# Patient Record
Sex: Male | Born: 1935
Health system: Southern US, Community
[De-identification: ages and names within clinical notes are randomized; demographics above are authoritative.]

## PROBLEM LIST (undated history)

## (undated) DIAGNOSIS — F03B11 Unspecified dementia, moderate, with agitation: Secondary | ICD-10-CM

## (undated) DIAGNOSIS — I2510 Atherosclerotic heart disease of native coronary artery without angina pectoris: Secondary | ICD-10-CM

## (undated) DIAGNOSIS — I10 Essential (primary) hypertension: Secondary | ICD-10-CM

## (undated) DIAGNOSIS — Z789 Other specified health status: Secondary | ICD-10-CM

## (undated) DIAGNOSIS — I4891 Unspecified atrial fibrillation: Secondary | ICD-10-CM

## (undated) DIAGNOSIS — E785 Hyperlipidemia, unspecified: Secondary | ICD-10-CM

## (undated) HISTORY — DX: Other specified health status: Z78.9

## (undated) HISTORY — DX: Unspecified atrial fibrillation: I48.91

## (undated) HISTORY — DX: Atherosclerotic heart disease of native coronary artery without angina pectoris: I25.10

## (undated) HISTORY — PX: ANKLE FRACTURE SURGERY: SHX122

## (undated) HISTORY — DX: Hyperlipidemia, unspecified: E78.5

## (undated) HISTORY — PX: CORONARY ANGIOPLASTY WITH STENT PLACEMENT: SHX49

## (undated) HISTORY — PX: CHOLECYSTECTOMY: SHX55

## (undated) HISTORY — PX: INGUINAL HERNIA REPAIR: SUR1180

---

## 1997-08-06 HISTORY — PX: OTHER SURGICAL HISTORY: SHX169

## 1998-03-30 ENCOUNTER — Encounter: Payer: Self-pay | Admitting: Emergency Medicine

## 1998-03-30 ENCOUNTER — Emergency Department (HOSPITAL_COMMUNITY): Admission: EM | Admit: 1998-03-30 | Discharge: 1998-03-30 | Payer: Self-pay | Admitting: Emergency Medicine

## 1998-03-31 ENCOUNTER — Ambulatory Visit: Admission: RE | Admit: 1998-03-31 | Discharge: 1998-03-31 | Payer: Self-pay | Admitting: Cardiology

## 2001-08-09 HISTORY — PX: CORONARY ARTERY BYPASS GRAFT: SHX141

## 2002-04-05 ENCOUNTER — Inpatient Hospital Stay (HOSPITAL_COMMUNITY): Admission: AD | Admit: 2002-04-05 | Discharge: 2002-04-10 | Payer: Self-pay | Admitting: Cardiology

## 2002-04-05 ENCOUNTER — Encounter: Payer: Self-pay | Admitting: Cardiology

## 2002-04-06 ENCOUNTER — Encounter: Payer: Self-pay | Admitting: Thoracic Surgery (Cardiothoracic Vascular Surgery)

## 2002-04-07 ENCOUNTER — Encounter: Payer: Self-pay | Admitting: Thoracic Surgery (Cardiothoracic Vascular Surgery)

## 2002-04-08 ENCOUNTER — Encounter: Payer: Self-pay | Admitting: Cardiothoracic Surgery

## 2002-04-09 ENCOUNTER — Encounter: Payer: Self-pay | Admitting: Cardiothoracic Surgery

## 2002-08-09 HISTORY — PX: LAPAROSCOPIC INCISIONAL / UMBILICAL / VENTRAL HERNIA REPAIR: SUR789

## 2002-08-13 ENCOUNTER — Encounter
Admission: RE | Admit: 2002-08-13 | Discharge: 2002-08-13 | Payer: Self-pay | Admitting: Thoracic Surgery (Cardiothoracic Vascular Surgery)

## 2002-08-13 ENCOUNTER — Encounter: Payer: Self-pay | Admitting: Thoracic Surgery (Cardiothoracic Vascular Surgery)

## 2002-11-23 ENCOUNTER — Encounter: Payer: Self-pay | Admitting: Thoracic Surgery (Cardiothoracic Vascular Surgery)

## 2002-11-29 ENCOUNTER — Inpatient Hospital Stay (HOSPITAL_COMMUNITY)
Admission: RE | Admit: 2002-11-29 | Discharge: 2002-12-01 | Payer: Self-pay | Admitting: Thoracic Surgery (Cardiothoracic Vascular Surgery)

## 2004-07-28 ENCOUNTER — Ambulatory Visit: Payer: Self-pay | Admitting: Cardiology

## 2005-01-09 ENCOUNTER — Ambulatory Visit: Payer: Self-pay | Admitting: Cardiology

## 2005-01-09 ENCOUNTER — Inpatient Hospital Stay (HOSPITAL_COMMUNITY): Admission: EM | Admit: 2005-01-09 | Discharge: 2005-01-15 | Payer: Self-pay | Admitting: Emergency Medicine

## 2005-01-11 ENCOUNTER — Ambulatory Visit: Payer: Self-pay | Admitting: Internal Medicine

## 2005-01-14 ENCOUNTER — Encounter (INDEPENDENT_AMBULATORY_CARE_PROVIDER_SITE_OTHER): Payer: Self-pay | Admitting: *Deleted

## 2005-05-26 ENCOUNTER — Encounter: Admission: RE | Admit: 2005-05-26 | Discharge: 2005-05-26 | Payer: Self-pay | Admitting: Orthopedic Surgery

## 2005-05-27 ENCOUNTER — Ambulatory Visit: Payer: Self-pay | Admitting: Cardiology

## 2005-06-03 ENCOUNTER — Ambulatory Visit: Payer: Self-pay | Admitting: Cardiology

## 2005-07-06 ENCOUNTER — Ambulatory Visit (HOSPITAL_COMMUNITY): Admission: RE | Admit: 2005-07-06 | Discharge: 2005-07-06 | Payer: Self-pay | Admitting: Orthopedic Surgery

## 2005-07-06 ENCOUNTER — Ambulatory Visit (HOSPITAL_BASED_OUTPATIENT_CLINIC_OR_DEPARTMENT_OTHER): Admission: RE | Admit: 2005-07-06 | Discharge: 2005-07-07 | Payer: Self-pay | Admitting: Orthopedic Surgery

## 2006-05-10 ENCOUNTER — Ambulatory Visit: Payer: Self-pay | Admitting: Cardiology

## 2006-05-17 ENCOUNTER — Ambulatory Visit: Payer: Self-pay | Admitting: Cardiology

## 2006-10-04 ENCOUNTER — Inpatient Hospital Stay (HOSPITAL_COMMUNITY): Admission: EM | Admit: 2006-10-04 | Discharge: 2006-10-06 | Payer: Self-pay | Admitting: Emergency Medicine

## 2006-10-04 ENCOUNTER — Ambulatory Visit: Payer: Self-pay | Admitting: Cardiology

## 2006-10-05 HISTORY — PX: CORONARY ANGIOPLASTY WITH STENT PLACEMENT: SHX49

## 2006-10-13 ENCOUNTER — Ambulatory Visit: Payer: Self-pay | Admitting: Cardiology

## 2006-11-21 ENCOUNTER — Ambulatory Visit: Payer: Self-pay | Admitting: Cardiology

## 2007-02-23 ENCOUNTER — Ambulatory Visit: Payer: Self-pay | Admitting: Cardiology

## 2007-08-11 ENCOUNTER — Ambulatory Visit: Payer: Self-pay | Admitting: Cardiology

## 2007-08-23 ENCOUNTER — Ambulatory Visit: Payer: Self-pay

## 2007-09-01 ENCOUNTER — Ambulatory Visit: Payer: Self-pay | Admitting: Cardiology

## 2007-10-18 ENCOUNTER — Ambulatory Visit: Payer: Self-pay | Admitting: Cardiology

## 2008-01-25 ENCOUNTER — Ambulatory Visit: Payer: Self-pay | Admitting: Cardiology

## 2008-07-25 ENCOUNTER — Ambulatory Visit: Payer: Self-pay | Admitting: Cardiology

## 2008-09-13 ENCOUNTER — Ambulatory Visit: Payer: Self-pay | Admitting: Diagnostic Radiology

## 2008-09-13 ENCOUNTER — Emergency Department (HOSPITAL_BASED_OUTPATIENT_CLINIC_OR_DEPARTMENT_OTHER): Admission: EM | Admit: 2008-09-13 | Discharge: 2008-09-13 | Payer: Self-pay | Admitting: Emergency Medicine

## 2008-09-16 ENCOUNTER — Ambulatory Visit: Payer: Self-pay | Admitting: Cardiology

## 2008-09-16 LAB — CONVERTED CEMR LAB
Basophils Absolute: 0 10*3/uL (ref 0.0–0.1)
Basophils Relative: 0.3 % (ref 0.0–3.0)
Bilirubin, Direct: 0.1 mg/dL (ref 0.0–0.3)
Chloride: 107 meq/L (ref 96–112)
Eosinophils Absolute: 0.5 10*3/uL (ref 0.0–0.7)
Eosinophils Relative: 7.6 % — ABNORMAL HIGH (ref 0.0–5.0)
GFR calc non Af Amer: 43 mL/min
HCT: 40.3 % (ref 39.0–52.0)
Lymphocytes Relative: 22.2 % (ref 12.0–46.0)
MCHC: 34.3 g/dL (ref 30.0–36.0)
MCV: 96.8 fL (ref 78.0–100.0)
Monocytes Absolute: 0.6 10*3/uL (ref 0.1–1.0)
Monocytes Relative: 9.6 % (ref 3.0–12.0)
Neutro Abs: 3.6 10*3/uL (ref 1.4–7.7)
Neutrophils Relative %: 60.3 % (ref 43.0–77.0)
RBC: 4.16 M/uL — ABNORMAL LOW (ref 4.22–5.81)
RDW: 12.9 % (ref 11.5–14.6)
Total Bilirubin: 1.3 mg/dL — ABNORMAL HIGH (ref 0.3–1.2)
Total CHOL/HDL Ratio: 5.5
Triglycerides: 127 mg/dL (ref 0–149)
VLDL: 25 mg/dL (ref 0–40)
WBC: 6 10*3/uL (ref 4.5–10.5)

## 2008-10-03 ENCOUNTER — Ambulatory Visit: Payer: Self-pay | Admitting: Cardiology

## 2008-10-03 LAB — CONVERTED CEMR LAB
CO2: 25 meq/L (ref 19–32)
Creatinine, Ser: 1.4 mg/dL (ref 0.4–1.5)
GFR calc non Af Amer: 53 mL/min
Glucose, Bld: 83 mg/dL (ref 70–99)

## 2008-10-10 ENCOUNTER — Ambulatory Visit: Payer: Self-pay | Admitting: Cardiology

## 2008-12-12 ENCOUNTER — Telehealth: Payer: Self-pay | Admitting: Cardiology

## 2009-03-07 ENCOUNTER — Ambulatory Visit: Payer: Self-pay | Admitting: Cardiology

## 2009-03-07 DIAGNOSIS — I25810 Atherosclerosis of coronary artery bypass graft(s) without angina pectoris: Secondary | ICD-10-CM | POA: Insufficient documentation

## 2009-03-07 DIAGNOSIS — E7800 Pure hypercholesterolemia, unspecified: Secondary | ICD-10-CM

## 2009-04-15 ENCOUNTER — Encounter: Payer: Self-pay | Admitting: Cardiology

## 2009-04-18 ENCOUNTER — Telehealth (INDEPENDENT_AMBULATORY_CARE_PROVIDER_SITE_OTHER): Payer: Self-pay | Admitting: Physician Assistant

## 2009-04-18 ENCOUNTER — Encounter: Payer: Self-pay | Admitting: Cardiology

## 2009-04-18 ENCOUNTER — Telehealth: Payer: Self-pay | Admitting: Cardiology

## 2009-04-18 ENCOUNTER — Emergency Department (HOSPITAL_COMMUNITY): Admission: EM | Admit: 2009-04-18 | Discharge: 2009-04-18 | Payer: Self-pay | Admitting: Emergency Medicine

## 2009-04-21 ENCOUNTER — Encounter: Payer: Self-pay | Admitting: Cardiology

## 2009-05-27 ENCOUNTER — Encounter: Payer: Self-pay | Admitting: Cardiology

## 2009-11-25 ENCOUNTER — Encounter: Payer: Self-pay | Admitting: Cardiology

## 2010-01-22 ENCOUNTER — Telehealth (INDEPENDENT_AMBULATORY_CARE_PROVIDER_SITE_OTHER): Payer: Self-pay | Admitting: *Deleted

## 2010-04-02 ENCOUNTER — Ambulatory Visit: Payer: Self-pay | Admitting: Cardiology

## 2010-08-06 ENCOUNTER — Inpatient Hospital Stay (HOSPITAL_COMMUNITY)
Admission: EM | Admit: 2010-08-06 | Discharge: 2010-08-08 | Payer: Self-pay | Source: Home / Self Care | Attending: Internal Medicine | Admitting: Internal Medicine

## 2010-08-06 ENCOUNTER — Telehealth: Payer: Self-pay | Admitting: Cardiology

## 2010-08-26 ENCOUNTER — Encounter: Payer: Self-pay | Admitting: Internal Medicine

## 2010-08-26 ENCOUNTER — Other Ambulatory Visit: Payer: Self-pay | Admitting: Internal Medicine

## 2010-08-26 ENCOUNTER — Ambulatory Visit
Admission: RE | Admit: 2010-08-26 | Discharge: 2010-08-26 | Payer: Self-pay | Source: Home / Self Care | Attending: Physician Assistant | Admitting: Physician Assistant

## 2010-08-26 DIAGNOSIS — I10 Essential (primary) hypertension: Secondary | ICD-10-CM | POA: Insufficient documentation

## 2010-08-26 DIAGNOSIS — E872 Acidosis: Secondary | ICD-10-CM | POA: Insufficient documentation

## 2010-08-26 LAB — BASIC METABOLIC PANEL
BUN: 25 mg/dL — ABNORMAL HIGH (ref 6–23)
CO2: 26 mEq/L (ref 19–32)
Calcium: 9.8 mg/dL (ref 8.4–10.5)
Chloride: 109 mEq/L (ref 96–112)
Creatinine, Ser: 1.5 mg/dL (ref 0.4–1.5)
GFR: 48.4 mL/min — ABNORMAL LOW (ref >60.00)
Glucose, Bld: 95 mg/dL (ref 70–99)
Potassium: 4.9 mEq/L (ref 3.5–5.1)
Sodium: 142 mEq/L (ref 135–145)

## 2010-08-31 NOTE — Discharge Summary (Addendum)
NAME:  SHAHMEER, BUNN NO.:  [id number]  MEDICAL RECORD NO.:  [id number]           PATIENT TYPE:  LOCATION:                                 FACILITY:  PHYSICIAN:  Peter C. Eden Emms, MD, FACCDATE OF BIRTH:  05/04/1936  DATE OF ADMISSION:  08/06/2010 DATE OF DISCHARGE:                              DISCHARGE SUMMARY   DATE OF DISCHARGE:  August 08, 2010  PRIMARY CARDIOLOGIST:  Arturo Morton. Riley Kill, MD, [facility]  DISCHARGE DIAGNOSIS:  Unstable angina.  SECONDARY DIAGNOSES: 1. Coronary artery disease status post prior coronary artery bypass     grafting. 2. Hypertension. 3. Hyperlipidemia. 4. Stage III chronic kidney disease. 5. History of bronchogenic cyst. 6. Bronchogenic Cyst status post resection. 7. History of ventral incisional hernia repair in 2004. 8. History of left ankle surgery.  ALLERGIES:  The patient is intolerant to STATINS.  PROCEDURES:  Left heart cardiac catheterization revealing mild-to- moderate in-stent restenosis within the previously placed LAD stent. The OM1 had an 80% to 90% stenosis proximally.  The RCA had minor irregularities throughout.  The vein graft to the diagonal was patent. The vein graft to the OM1 was occluded.  The vein graft to the PDA was not checked as this was known to be occluded.  The LIMA to the LAD was atretic.  PROCEDURE:  Successful PCI and stenting of the first obtuse marginal with placement of a 3.5 x 20 mm Promus Element Plus drug-eluting stent.  HISTORY OF PRESENT ILLNESS:  This is a 75 year old Caucasian male with prior history of CAD status post coronary bypass grafting in 2003.  The patient was in his usual state of health approximately a week prior to admission when he began to experience intermittent left-sided chest discomfort similar to previous anginal equivalent.  Because of progression of symptoms, patient presented to the [facility] ED on December 29, where ECG showed no acute changes.  The  point-of-care cardiac markers, however, were slightly elevated with troponin 0.17.  The patient was admitted for further evaluation.  HOSPITAL COURSE:  The patient's subsequent cardiac markers showed normal troponins as well as normal CKs and MBs.  Regardless, given his progression of symptoms and similarity to previous angina, decision was made to proceed with cardiac catheterization.  The patient underwent diagnostic catheterization on December 30th with the results as outlined above.  He had 80% to 90% stenosis within the first obtuse marginal and occlusion of the vein graft to that vessel.  Previously placed stents in the LAD and right coronary artery were patent.  Cath films were reviewed.  Attention was turned to the native OM1 which was successfully stented with a 3.5 x 20-mm Promus Element Plus drug-eluting stent.  The patient tolerated this procedure well and post procedure, had been ambulating without recurrent symptoms or limitations.  He will be discharged home today in good condition.  DISCHARGE LABORATORY DATA:  Hemoglobin 13.4, hematocrit 39.5, WBCs 6.9, and platelets 160.  Sodium 136, potassium 4.2, chloride 109, CO2 of 19, BUN 15, creatinine 1.29, glucose 100.  Total bilirubin 1.1, alkaline phosphatase 69, AST 23, ALT 17, total protein 6.7, albumin 4.0,  calcium 8.4, CK 62, MB 3.1, troponin I of 0.06.  BNP was 187 on admission.  TSH 0.652.  DISPOSITION:  The patient will be discharged home today in good condition.  FOLLOWUP APPOINTMENTS:  We will arrange for followup with Tereso Newcomer, PA, in approximately 2 weeks.  DISCHARGE MEDICATIONS: 1. Nitroglycerin 0.4 mg sublingual p.r.n. chest pain. 2. Aspirin 325 mg daily. 3. Amlodipine 10 mg daily. 4. Atenolol 50 mg daily. 5. Plavix 75 mg daily. 6. The patient is not on a statin secondary to previous intolerance.  STUDIES:  None.  Duration of discharge encounter, 45 minutes including physician  time.      Nicolasa Ducking, ANP   ______________________________ Noralyn Pick. Eden Emms, MD, [facility]    CB/MEDQ  D:  08/08/2010  T:  08/08/2010  Job:  [id number]  Electronically Signed by Nicolasa Ducking ANP on 08/31/2010 03:39:35 PM Electronically Signed by Charlton Haws MD FACC on 08/31/2010 05:26:08 PM

## 2010-09-04 NOTE — H&P (Signed)
NAME:  Steve Gordon, Steve Gordon NO.:  [id number]  MEDICAL RECORD NO.:  [id number]          PATIENT TYPE:  INP  LOCATION:  2807                         FACILITY:  [facility]  PHYSICIAN:  Pricilla Riffle, MD, FACCDATE OF BIRTH:  05-28-1936  DATE OF ADMISSION:  08/06/2010 DATE OF DISCHARGE:                             HISTORY & PHYSICAL   PRIMARY CARDIOLOGIST:  Arturo Morton. Riley Kill, MD, [facility]  CHIEF COMPLAINT:  Chest discomfort.  HISTORY OF PRESENT ILLNESS:  Steve Gordon is a 75 year old Caucasian gentleman with a known history of coronary artery disease having had an anterior wall MI in 1998, of CABG x4 2003, most recently PCI of the native right coronary artery with non-DES February 2008 as well as history significant for hypertension, dyslipidemia, and CKD stage III now presents for further evaluation of symptoms similar to those he was having in 2008.  The patient was in his usual state of health until little over a week ago when he began to notice brief (30 seconds to a minute) left-sided chest discomfort that would slowly dissipate after initially being noticed.  He has no radiation nor any associated symptoms.  The patient says that this is how his symptoms started in 2008 prior to his catheterization and eventual PCI of the right coronary artery.  Of note, the patient also states that these symptoms completely resolved after his PCI.  He decided to come in sooner than his last time as his symptoms progressed and became pretty severe prior to his catheterization in 2008.  Upon arrival, EKG shows sinus bradycardia without concerning ST changes but point-of-care markers reveal a troponin I elevated to 0.17.  Labs only remarkable for creatinine of 1.38, BUN 19, BNP 187.  Vital signs significant for mildly hypertensive with peak systolic of 159 and bradycardic in the 50s.  Otherwise vital signs within normal limits and stable.  Chest x-ray unremarkable. Currently the patient is  asymptomatic except for a headache that started shortly after receiving nitro paste.  Note the patient's symptoms of chest discomfort have been increasing in frequency now happening sometimes multiple times throughout the day.  He denies any clear aggravating or alleviating factors.  No associated symptoms like shortness of breath, PND, nausea, vomiting, palpitations, pre/syncope.  PAST MEDICAL HISTORY: 1. CAD.     a.     Anterior wall MI (PCI of LAD and diagonal), 1998.     b.     CABG x4 (LIMA to LAD, SVG - diagonal, SVG to OM, SVG to      PDA), 2003.     c.     PCI of native RCA with 4.0 x 16-mm Libert? non-DES, October 05, 2006 (OM mildly calcified without significant disease, LAD      less than 50% in-stent restenosis, D. 2. Diffusely diseased, rest of all LAD with mild nonobstructive     disease.  Left circumflex has large OM. 3. With 75-80% stenosis, left circumflex with mild disease otherwise.     RCA with 99% stenosis, S/P BMS.  PDA 30-50% lesions throughout.     LIMA to LAD atretic.  Patent SVG to diagonal.  SVG to OM and right     PDA occluded at aortic anastomosis.  Normal LVEF). 4. Dyslipidemia. 5. Hypertension. 6. CKD, stage III. 7. Resection of bronchogenic cyst, 1998. 8. Ventral incisional hernia repair, 2004. 9. Left ankle surgery.  SOCIAL HISTORY:  The patient lives in [place] with his wife.  He has 2 daughters and a stepson, all grown.  He is retired from [employer] as a Naval architect since 1610.  Remote minimal tobacco abuse 37 years ago. EtOH rarely without binge drinking.  No illicit drugs.  No herbal meds. He generally follows heart healthy diet and does no regular exercise but is active, working on cars every single day.  FAMILY HISTORY:  Negative for premature diagnosis of coronary artery disease.  REVIEW OF SYSTEMS:  Please see HPI.  All other systems reviewed and were negative.  CODE STATUS:  Full.  ALLERGIES:  NKDA.  Intolerant to  STATINS.  MEDICATIONS: 1. Norvasc 10 mg p.o. at bedtime. 2. Atenolol 50 mg p.o. at bedtime. 3. Sublingual nitroglycerin 0.4 mg p.r.n. for chest discomfort. 4. Plavix 75 mg p.o. at bedtime. 5. Enteric-coated aspirin 81 mg p.o. at bedtime.  PHYSICAL EXAMINATION:  VITAL SIGNS:  Temperature 98.2 degrees Fahrenheit, BP ranging from 118-159/77-81, pulse in the 50s, respiration 18, O2 saturation 99% on room air. GENERAL:  The patient is alert and oriented x3 in no apparent distress, able to speak easily without respiratory distress. HEAD:  Normocephalic, atraumatic.  Pupils are equal, round, and reactive to light.  Extraocular muscles are intact.  Nares are patent without discharge.  Oropharynx without erythema or exudates. NECK:  Supple without lymphadenopathy.  No thyromegaly.  No JVD. HEART:  Rate is regular with audible S1, S2.  No clicks, rubs, murmurs, or gallops.  Pulses are 2+ and equal in both upper and lower extremities bilaterally. LUNGS:  Clear to auscultation bilaterally. SKIN:  No rashes, lesions, or petechiae. ABDOMEN:  Soft, nontender, nondistended.  Normal abdominal bowel sounds. No rebound or guarding, no hepatosplenomegaly.  EXTREMITIES:  Reveal no clubbing, cyanosis nor any edema. MUSCULOSKELETAL:  Without joint deformity or effusions.  No spinal or CVA tenderness. NEUROLOGIC:  Cranial Nerves II through XII grossly intact.  Strength 5/5 in all extremities and axial groups.  Normal sensation throughout and normal cerebellar function.  RADIOLOGY:  Chest x-ray showed possible left upper lobe nodule (follow up with nonemergent outpatient CT without contrast).  EKG, sinus bradycardia, 52 bpm, no significant ST-T wave changes.  No significant Q-waves, normal axis, no evidence of hypertrophy, PR 160, QRS 94, QTC 399.  No changes from last tracing completed February 2010 except for rate has decreased.  LABORATORY DATA:  WBC 7.7, HGB 14.7, HCT 42.8, PLT count was  169,000. Sodium 139, potassium 4.7, chloride 110, bicarb 22, BUN 19, creatinine 1.3, glucose 97.  Liver function tests within normal limits.  BNP 187, troponin 0.17.  Protime 12.9, INR 0.95.  ASSESSMENT/PLAN:  The patient initially seen by Lenard Galloway, [facility] and currently being seen and thoroughly assessed by attending cardiologist, Dr. Dietrich Pates.  Please see her addendum for any significant changes to this assessment and plan.  Mr. Slaymaker is a 75 year old Caucasian gentleman with the above-noted complex history of coronary artery disease including anterior wall MI with PCI of LAD and diagonal in 1998, CABG x4 in 2003, and last cath resulting in PCI with BMS to right coronary artery with 1/4 patent/atretic grafts as well as history significant for dyslipidemia, hypertension,  and CKD stage III who now presents with atypical symptoms that are unfortunately, however, identical to his angina he recalls prior to PCI in 2008 with minimal troponin-I elevation on point-of-care markers.  Non-ST elevation myocardial infarction - minimal troponin elevation and atypical symptoms but as they are identical to symptoms prior to PCI in 2008, would follow enzymes and favor cardiac catheterization.  We will continue home medications, cycle enzymes and likely complete cardiac catheterization tomorrow.  Risks and benefits reviewed with the patient and he is willing to proceed if the final recommendation is for cath.     Jarrett Ables, PAC   ______________________________ Pricilla Riffle, MD, [facility]    MS/MEDQ  D:  08/06/2010  T:  08/07/2010  Job:  [id number]  Electronically Signed by Jarrett Ables PAC on 08/19/2010 02:56:25 PM Electronically Signed by Dietrich Pates MD FACC on 09/04/2010 07:55:49 PM

## 2010-09-08 NOTE — Progress Notes (Signed)
Summary: QUESTION ABOUT MEDS/UPCOMING PROCEDURE  Medications Added AMLODIPINE BESYLATE 10 MG TABS (AMLODIPINE BESYLATE) Take 1 tablet by mouth once a day ATENOLOL 50 MG TABS (ATENOLOL) Take 1 tablet by mouth once a day NITROSTAT 0.4 MG SUBL (NITROGLYCERIN) Place 1 tablet under tongue as directed PLAVIX 75 MG TABS (CLOPIDOGREL BISULFATE) Take 1 tablet by mouth once a day ZETIA 10 MG TABS (EZETIMIBE) Take 1 tablet by mouth once a day       Phone Note Call from Patient Call back at [phone]   Caller: Spouse [name] Summary of Call: PATIENT IS SCHEDULE TO HAVE A MOLE REMOVED.  HOW LONG SHOULD BE BE OFF OF PLAVIX (BEFORE AND AFTER)? Initial call taken by: Burnard Leigh,  Dec 12, 2008 4:03 PM  Follow-up for Phone Call        [facility] to call back. Julieta Gutting, RN, BSN  Dec 12, 2008 4:05 PM   CALLING BACK TO SPEAK WITH [place], [place] [name] . GESILA DAVIS   Additional Follow-up for Phone Call Additional follow up Details #1::        Spoke with pt's wife and pt has not seen the doctor yet about having mole removed.  I told her he needed to be seen and that the pt probably will not need to stop Plavix.  When the pt is evaluated the physician should call our office if plavix needs to be stopped. Pt's wife agrees with plan. Additional Follow-up by: Julieta Gutting, RN, BSN,  Dec 13, 2008 1:17 PM    New/Updated Medications: AMLODIPINE BESYLATE 10 MG TABS (AMLODIPINE BESYLATE) Take 1 tablet by mouth once a day ATENOLOL 50 MG TABS (ATENOLOL) Take 1 tablet by mouth once a day NITROSTAT 0.4 MG SUBL (NITROGLYCERIN) Place 1 tablet under tongue as directed PLAVIX 75 MG TABS (CLOPIDOGREL BISULFATE) Take 1 tablet by mouth once a day ZETIA 10 MG TABS (EZETIMIBE) Take 1 tablet by mouth once a day

## 2010-09-08 NOTE — Assessment & Plan Note (Signed)
Summary: Steve Gordon   Visit Type:  1 year follow up  CC:  No cardiac complains.  History of Present Illness: Patient is doing well.  Has ot gained weight.  Still restoring cars.  Most recently working on a [employer] T.  No chest pain.    Current Medications (verified): 1)  Amlodipine Besylate 10 Mg Tabs (Amlodipine Besylate) .... Take 1 Tablet By Mouth Once A Day 2)  Atenolol 50 Mg Tabs (Atenolol) .... Take 1 Tablet By Mouth Once A Day 3)  Nitrostat 0.4 Mg Subl (Nitroglycerin) .... Place 1 Tablet Under Tongue As Directed 4)  Plavix 75 Mg Tabs (Clopidogrel Bisulfate) .... Take 1 Tablet By Mouth Once A Day 5)  Aspirin 81 Mg Tbec (Aspirin) .... 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 Head:  normocephalic and atraumatic Eyes:  PERRLA/EOM intact; conjunctiva and lids normal. Lungs:  Clear bilaterally to auscultation and percussion. Heart:  PMI non displaced. Normal S1 and S2.  NO murmur.    Extremities:  No clubbing or cyanosis. Neurologic:  Alert and oriented x 3.   EKG  Procedure date:  04/02/2010  Findings:      marked sinus bradycardia, otherwise normal .  Impression & Recommendations:  Problem # 1:  CAD, ARTERY BYPASS GRAFT (ICD-414.04)  entirely stable.  No chest pain.  His updated medication list for this problem includes:    Amlodipine Besylate 10 Mg Tabs (Amlodipine besylate) .Marland Kitchen... Take 1 tablet by mouth once a day    Atenolol 50 Mg Tabs (Atenolol) .Marland Kitchen... Take 1 tablet by mouth once a day    Nitrostat 0.4 Mg Subl (Nitroglycerin) .Marland Kitchen... Place 1 tablet under tongue as directed    Plavix 75 Mg Tabs (Clopidogrel bisulfate) .Marland Kitchen... Take 1 tablet by mouth once a day    Aspirin 81 Mg Tbec (Aspirin) .Marland Kitchen... Take one tablet by mouth daily  His updated medication list for this problem includes:    Amlodipine Besylate 10 Mg Tabs (Amlodipine besylate) .Marland Kitchen... Take 1 tablet by mouth once a day    Atenolol 50 Mg Tabs (Atenolol) .Marland Kitchen... Take 1 tablet by mouth once a day    Nitrostat 0.4 Mg Subl (Nitroglycerin) .Marland Kitchen... Place 1 tablet under tongue as directed    Plavix 75 Mg Tabs (Clopidogrel bisulfate) .Marland Kitchen... Take 1 tablet by mouth once a day    Aspirin 81 Mg Tbec (Aspirin) .Marland KitchenMarland KitchenMarland KitchenMarland Kitchen  Take one tablet by mouth daily  Problem # 2:  HYPERCHOLESTEROLEMIA  IIA (ICD-272.0) not a statin candidate.    Other Orders: EKG w/ Interpretation (93000)  Patient Instructions: 1)  Your physician recommends that you return for a FASTING lipid and liver profile: after losing five pounds. Please call when ready to schedule appointment. 2)  Your physician recommends that you continue on your current medications as directed. Please refer to the Current Medication list given to you today. 3)  Your physician wants you to follow-up in: 1 year   You will receive a reminder letter in the mail two months in advance. If you don't receive a letter, please call our office to schedule the follow-up  appointment. Prescriptions: NITROSTAT 0.4 MG SUBL (NITROGLYCERIN) Place 1 tablet under tongue as directed  #25 x 2   Entered by:   Vikki Ports   Authorized by:   Ronaldo Miyamoto, MD, [facility]   Signed by:   Vikki Ports on 04/02/2010   Method used:   Printed then faxed to ...       [facility]* (retail)             ,          Ph: [phone]       Fax: [phone]   RxID:   [id number]

## 2010-09-08 NOTE — Progress Notes (Signed)
Summary: refill  Phone Note Refill Request Call back at [phone] Message from:  Patient on January 22, 2010 2:10 PM  Refills Requested: Medication #1:  ATENOLOL 50 MG TABS Take 1 tablet by mouth once a day  Medication #2:  AMLODIPINE BESYLATE 10 MG TABS Take 1 tablet by mouth once a day  Medication #3:  PLAVIX 75 MG TABS Take 1 tablet by mouth once a day  Medication #4:  NITROSTAT 0.4 MG SUBL Place 1 tablet under tongue as directed [facility] send  Nitrostst to [facility] [phone]  Initial call taken by: Judie Grieve,  January 22, 2010 2:12 PM  Follow-up for Phone Call        Rx faxed to pharmacy Nitrog. called to pharmacy Follow-up by: Vikki Ports,  January 22, 2010 4:05 PM    Prescriptions: PLAVIX 75 MG TABS (CLOPIDOGREL BISULFATE) Take 1 tablet by mouth once a day  #90 x 3   Entered by:   Vikki Ports   Authorized by:   Ronaldo Miyamoto, MD, [facility]   Signed by:   Vikki Ports on 01/22/2010   Method used:   Faxed to ...       [facility]* (retail)             ,          Ph: [phone]       Fax: [phone]   RxID:   [id number] ATENOLOL 50 MG TABS (ATENOLOL) Take 1 tablet by mouth once a day  #90 x 3   Entered by:   Vikki Ports   Authorized by:   Ronaldo Miyamoto, MD, [facility]   Signed by:   Vikki Ports on 01/22/2010   Method used:   Faxed to ...       [facility]* (retail)             ,          Ph: [phone]       Fax: [phone]   RxID:   [id number] AMLODIPINE BESYLATE 10 MG TABS (AMLODIPINE BESYLATE) Take 1 tablet by mouth once a day  #90 x 3   Entered by:   Vikki Ports   Authorized by:   Ronaldo Miyamoto, MD, [facility]   Signed by:   Vikki Ports on 01/22/2010   Method used:   Faxed to ...       [facility]* (retail)             ,          Ph: [phone]       Fax: [phone]   RxID:   [id number]

## 2010-09-08 NOTE — Letter (Signed)
Summary: [facility] Office Note  [facility] Office Note   Imported By: Roderic Ovens 01/28/2010 15:12:47  _____________________________________________________________________  External Attachment:    Type:   Image     Comment:   External Document

## 2010-09-08 NOTE — Letter (Signed)
Summary: [facility]   [facility]   Imported By: Roderic Ovens 10/15/2009 14:01:20  _____________________________________________________________________  External Attachment:    Type:   Image     Comment:   External Document

## 2010-09-10 NOTE — Progress Notes (Addendum)
Summary: chest pains  Phone Note Call from Patient Call back at Home Phone [phone]   Caller: Patient Summary of Call: Pt having chest pains Initial call taken by: Judie Grieve,  August 06, 2010 12:27 PM  Follow-up for Phone Call        SPOKE WITH PT AND PT'S WIFE  OVER THE LAST COUPLE OF WEEKS HAS NOTED CHEST PAIN NO C/O SOB  NAUSEA OR JAW PAIN NO NITRO TAKEN PER PT NOT THAT SEVERE PAIN HAS COME ON AT REST WILL DISCUSS WITH DOD AND CALL PT BACK WITH DIRECTIONS. Follow-up by: Scherrie Bateman, LPN,  August 06, 2010 12:37 PM  Additional Follow-up for Phone Call Additional follow up Details #1::        DISCUSSED WITH DR CRENSHAW PER DR CRENSHAW PT NEEDS TO GO ER FOR EVALUATION AND TREATMENT.PT AWARE. Additional Follow-up by: Scherrie Bateman, LPN,  August 06, 2010 1:14 PM     Appended Document: chest pains Reviewed. TS

## 2010-09-10 NOTE — Assessment & Plan Note (Signed)
Summary: eph/status post PCI per night message   Visit Type:  eph  CC:  no complaints.  Preventive Screening-Counseling & Management  Alcohol-Tobacco     Smoking Status: quit  Problems Prior to Update: 1)  Hypertension, Benign  (ICD-401.1) 2)  Metabolic Acidosis  (ICD-276.2) 3)  Sinus Bradycardia  (ICD-427.81) 4)  Nonstemi  (ICD-410.90) 5)  Hypercholesterolemia Iia  (ICD-272.0) 6)  Cad, Artery Bypass Graft  (ICD-414.04)  Current Medications (verified): 1)  Amlodipine Besylate 10 Mg Tabs (Amlodipine Besylate) .... Take 1 Tablet By Mouth Once A Day 2)  Atenolol 50 Mg Tabs (Atenolol) .... Take 1 Tablet By Mouth Once A Day 3)  Nitrostat 0.4 Mg Subl (Nitroglycerin) .... Place 1 Tablet Under Tongue As Directed 4)  Plavix 75 Mg Tabs (Clopidogrel Bisulfate) .... Take 1 Tablet By Mouth Once A Day 5)  Aspirin 81 Mg Tbec (Aspirin) .... Take One Tablet By Mouth Daily  Allergies (verified): No Known Drug Allergies  Past History:  Past Medical History: Last updated: 10/06/2008 Anterior wall MI 1998 with stenting LAD and diagonal CAD sp CABG S/P PCI  Dyslipidemia Statin Intolerance  Past Surgical History: Last updated: 10/06/2008 CABG x 4 2003 LIMA to LAD SVG diagonal, OM, PDA Stent Native RCA 10/05/2006 4.79mm x 16mm Liberte Non DES Ventral incisional hernia repair 2004 Resection of bronchogenic cyst 08-06-1997  Family History: Last updated: 08/26/2010 Hypertension, heart disease, and kidney cancer  Social History: Last updated: 08/26/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  quite 40+ years Smoking Status:  quit  Vital Signs:  Patient profile:   75 year old male Height:      69 inches Weight:      178.50  pounds BMI:     26.46 Pulse rate:   50 / minute BP sitting:   150 / 73  (left arm) Cuff size:   regular  Vitals Entered By: Caralee Ates CMA (August 26, 2010 8:56 AM)   Other Orders: TLB-BMP (Basic Metabolic Panel-BMET) (80048-METABOL)  Patient Instructions: 1)  Your physician recommends that you have lab work today: BMET 2)  Your physician has recommended you make the following change in your medication: Decrease Aspirin to 81mg  daily. 3)  Your physician wants you to follow-up in:3 months with Dr. Riley Kill.   You will receive a reminder letter in the mail two months in advance. If you don't receive a letter, please call our office to schedule the follow-up appointment.

## 2010-09-10 NOTE — Assessment & Plan Note (Signed)
Summary: eph/status post PCI per night message   History of Present Illness: mr Lemmons seeing follwing DES to OM;  prior cabg and stenting   no further sighnificant cxhest pain  He hast long-standing complaints of numbness in his left arm.  He has chosen not to pursue primary care physicians because of problems in the past. Her records as his blood pressures at home have been normal  Allergies: No Known Drug Allergies  Physical Exam  General:  The patient was alert and oriented in no acute distress. HEENT Normal.  Neck veins were flat, carotids were brisk.  Lungs were clear.  Heart sounds were regular without murmurs or gallops.  Abdomen was soft with active bowel sounds. There is no clubbing cyanosis or edema. Skin Warm and dry groin is well-healed with a little bit of fullness of the artery no bruit was noted   Impression & Recommendations:  Problem # 1:  NONSTEMI (ICD-410.90) The patient will be maintained on Plavix and aspirin; we will reduce his aspirin from 325-81. He has not been able to tolerate statins in the past His updated medication list for this problem includes:    Amlodipine Besylate 10 Mg Tabs (Amlodipine besylate) .Marland Kitchen... Take 1 tablet by mouth once a day    Atenolol 50 Mg Tabs (Atenolol) .Marland Kitchen... Take 1 tablet by mouth once a day    Nitrostat 0.4 Mg Subl (Nitroglycerin) .Marland Kitchen... Place 1 tablet under tongue as directed    Plavix 75 Mg Tabs (Clopidogrel bisulfate) .Marland Kitchen... Take 1 tablet by mouth once a day    Aspirin 81 Mg Tbec (Aspirin) .Marland Kitchen... Take one tablet by mouth daily  Problem # 2:  SINUS BRADYCARDIA (ICD-427.81) Bradycardia is chronic. We'll use atenolol at its current dose. His updated medication list for this problem includes:    Amlodipine Besylate 10 Mg Tabs (Amlodipine besylate) .Marland Kitchen... Take 1 tablet by mouth once a day    Atenolol 50 Mg Tabs (Atenolol) .Marland Kitchen... Take 1 tablet by mouth once a day    Nitrostat 0.4 Mg Subl (Nitroglycerin) .Marland Kitchen... Place 1 tablet under  tongue as directed    Plavix 75 Mg Tabs (Clopidogrel bisulfate) .Marland Kitchen... Take 1 tablet by mouth once a day    Aspirin 81 Mg Tbec (Aspirin) .Marland Kitchen... Take one tablet by mouth daily  Problem # 3:  METABOLIC ACIDOSIS (ICD-276.2) he had evidence of a metabolic acidosis during his hospital patient. I reviewed records back 10 years. No previous evidence of this was noted. We will recheck his metabolic profile today.  Problem # 5:  HYPERTENSION, BENIGN (ICD-401.1) has hypertension today but as noted in history of present illness, his blood pressures in the 120 range at home we'll continue to follow  His updated medication list for this problem includes:    Amlodipine Besylate 10 Mg Tabs (Amlodipine besylate) .Marland Kitchen... Take 1 tablet by mouth once a day    Atenolol 50 Mg Tabs (Atenolol) .Marland Kitchen... Take 1 tablet by mouth once a day    Aspirin 81 Mg Tbec (Aspirin) .Marland Kitchen... Take one tablet by mouth daily

## 2010-10-19 LAB — BASIC METABOLIC PANEL
BUN: 15 mg/dL (ref 6–23)
CO2: 21 mEq/L (ref 19–32)
Calcium: 8.4 mg/dL (ref 8.4–10.5)
Creatinine, Ser: 1.29 mg/dL (ref 0.4–1.5)
GFR calc Af Amer: >60 mL/min (ref >60)
Glucose, Bld: 94 mg/dL (ref 70–99)

## 2010-10-19 LAB — CBC
HCT: 38.7 % — ABNORMAL LOW (ref 39.0–52.0)
HCT: 41.5 % (ref 39.0–52.0)
HCT: 42.8 % (ref 39.0–52.0)
Hemoglobin: 12.9 g/dL — ABNORMAL LOW (ref 13.0–17.0)
Hemoglobin: 14.3 g/dL (ref 13.0–17.0)
Hemoglobin: 14.7 g/dL (ref 13.0–17.0)
MCH: 32.5 pg (ref 26.0–34.0)
MCH: 32.7 pg (ref 26.0–34.0)
MCV: 95.1 fL (ref 78.0–100.0)
Platelets: 160 10*3/uL (ref 150–400)
Platelets: 169 10*3/uL (ref 150–400)
Platelets: 171 10*3/uL (ref 150–400)
RBC: 4.08 MIL/uL — ABNORMAL LOW (ref 4.22–5.81)
RBC: 4.17 MIL/uL — ABNORMAL LOW (ref 4.22–5.81)
RBC: 4.4 MIL/uL (ref 4.22–5.81)
RBC: 4.5 MIL/uL (ref 4.22–5.81)
RDW: 12.9 % (ref 11.5–15.5)
RDW: 13 % (ref 11.5–15.5)
RDW: 13.1 % (ref 11.5–15.5)
WBC: 5.7 10*3/uL (ref 4.0–10.5)
WBC: 6.9 10*3/uL (ref 4.0–10.5)
WBC: 7.4 10*3/uL (ref 4.0–10.5)
WBC: 7.7 10*3/uL (ref 4.0–10.5)

## 2010-10-19 LAB — TSH: TSH: 0.652 u[IU]/mL (ref 0.350–4.500)

## 2010-10-19 LAB — BRAIN NATRIURETIC PEPTIDE: Pro B Natriuretic peptide (BNP): 187 pg/mL — ABNORMAL HIGH (ref 0.0–100.0)

## 2010-10-19 LAB — DIFFERENTIAL
Basophils Absolute: 0 10*3/uL (ref 0.0–0.1)
Eosinophils Relative: 6 % — ABNORMAL HIGH (ref 0–5)
Lymphs Abs: 1.9 10*3/uL (ref 0.7–4.0)
Monocytes Relative: 8 % (ref 3–12)
Neutro Abs: 4.5 10*3/uL (ref 1.7–7.7)

## 2010-10-19 LAB — COMPREHENSIVE METABOLIC PANEL
ALT: 17 U/L (ref 0–53)
BUN: 19 mg/dL (ref 6–23)
CO2: 22 mEq/L (ref 19–32)
GFR calc Af Amer: >60 mL/min (ref >60)
Glucose, Bld: 97 mg/dL (ref 70–99)
Sodium: 139 mEq/L (ref 135–145)
Total Protein: 6.7 g/dL (ref 6.0–8.3)

## 2010-10-19 LAB — CK TOTAL AND CKMB (NOT AT ARMC): Relative Index: INVALID (ref 0.0–2.5)

## 2010-10-19 LAB — POCT CARDIAC MARKERS
CKMB, poc: 1.4 ng/mL (ref 1.0–8.0)
Myoglobin, poc: 108 ng/mL (ref 12–200)

## 2010-10-19 LAB — CARDIAC PANEL(CRET KIN+CKTOT+MB+TROPI)
CK, MB: 1.8 ng/mL (ref 0.3–4.0)
CK, MB: 2.1 ng/mL (ref 0.3–4.0)
Relative Index: INVALID (ref 0.0–2.5)
Total CK: 62 U/L (ref 7–232)
Troponin I: 0.01 ng/mL (ref 0.00–0.06)
Troponin I: 0.06 ng/mL (ref 0.00–0.06)

## 2010-10-19 LAB — HEPARIN LEVEL (UNFRACTIONATED): Heparin Unfractionated: 0.25 IU/mL — ABNORMAL LOW (ref 0.30–0.70)

## 2010-11-13 LAB — CBC
MCHC: 34.6 g/dL (ref 30.0–36.0)
MCV: 98.1 fL (ref 78.0–100.0)
Platelets: 166 10*3/uL (ref 150–400)
RBC: 4.44 MIL/uL (ref 4.22–5.81)
RDW: 13.9 % (ref 11.5–15.5)

## 2010-11-13 LAB — URINALYSIS, ROUTINE W REFLEX MICROSCOPIC
Ketones, ur: NEGATIVE mg/dL
Nitrite: NEGATIVE
Specific Gravity, Urine: 1.022 (ref 1.005–1.030)
Urobilinogen, UA: 0.2 mg/dL (ref 0.0–1.0)
pH: 5.5 (ref 5.0–8.0)

## 2010-11-13 LAB — COMPREHENSIVE METABOLIC PANEL
ALT: 26 U/L (ref 0–53)
AST: 22 U/L (ref 0–37)
Albumin: 4.1 g/dL (ref 3.5–5.2)
CO2: 22 mEq/L (ref 19–32)
Calcium: 9.1 mg/dL (ref 8.4–10.5)
Creatinine, Ser: 2.58 mg/dL — ABNORMAL HIGH (ref 0.4–1.5)
GFR calc Af Amer: 30 mL/min — ABNORMAL LOW (ref >60)
GFR calc non Af Amer: 25 mL/min — ABNORMAL LOW (ref >60)
Sodium: 133 mEq/L — ABNORMAL LOW (ref 135–145)
Total Protein: 7.5 g/dL (ref 6.0–8.3)

## 2010-11-13 LAB — DIFFERENTIAL
Basophils Absolute: 0 10*3/uL (ref 0.0–0.1)
Basophils Relative: 0 % (ref 0–1)
Eosinophils Absolute: 0 10*3/uL (ref 0.0–0.7)
Eosinophils Relative: 0 % (ref 0–5)
Lymphocytes Relative: 2 % — ABNORMAL LOW (ref 12–46)
Lymphs Abs: 0.4 10*3/uL — ABNORMAL LOW (ref 0.7–4.0)
Monocytes Absolute: 1.2 10*3/uL — ABNORMAL HIGH (ref 0.1–1.0)
Monocytes Relative: 7 % (ref 3–12)
Neutro Abs: 14.9 10*3/uL — ABNORMAL HIGH (ref 1.7–7.7)
Neutrophils Relative %: 91 % — ABNORMAL HIGH (ref 43–77)

## 2010-11-13 LAB — PROTIME-INR
INR: 1.1 (ref 0.00–1.49)
Prothrombin Time: 13.7 seconds (ref 11.6–15.2)

## 2010-11-13 LAB — URINE CULTURE: Colony Count: 25000

## 2010-11-13 LAB — URINE MICROSCOPIC-ADD ON

## 2010-11-13 LAB — LIPASE, BLOOD: Lipase: 17 U/L (ref 11–59)

## 2010-11-13 LAB — APTT: aPTT: 27 seconds (ref 24–37)

## 2010-11-24 ENCOUNTER — Encounter: Payer: Self-pay | Admitting: Cardiology

## 2010-11-24 LAB — URINALYSIS, ROUTINE W REFLEX MICROSCOPIC
Glucose, UA: NEGATIVE mg/dL
Ketones, ur: NEGATIVE mg/dL
Leukocytes, UA: NEGATIVE
Nitrite: NEGATIVE
pH: 5 (ref 5.0–8.0)

## 2010-11-24 LAB — CBC
HCT: 45.6 % (ref 39.0–52.0)
Hemoglobin: 14.9 g/dL (ref 13.0–17.0)
MCHC: 32.8 g/dL (ref 30.0–36.0)
Platelets: 215 10*3/uL (ref 150–400)
RDW: 13 % (ref 11.5–15.5)

## 2010-11-24 LAB — BASIC METABOLIC PANEL
BUN: 29 mg/dL — ABNORMAL HIGH (ref 6–23)
CO2: 23 mEq/L (ref 19–32)
Calcium: 9.6 mg/dL (ref 8.4–10.5)
Glucose, Bld: 115 mg/dL — ABNORMAL HIGH (ref 70–99)
Potassium: 4.3 mEq/L (ref 3.5–5.1)
Sodium: 138 mEq/L (ref 135–145)

## 2010-11-24 LAB — DIFFERENTIAL
Basophils Absolute: 0.1 10*3/uL (ref 0.0–0.1)
Basophils Relative: 1 % (ref 0–1)
Eosinophils Absolute: 0.3 10*3/uL (ref 0.0–0.7)
Eosinophils Relative: 2 % (ref 0–5)
Monocytes Absolute: 0.9 10*3/uL (ref 0.1–1.0)

## 2010-11-24 LAB — URINE MICROSCOPIC-ADD ON

## 2010-11-25 ENCOUNTER — Ambulatory Visit (INDEPENDENT_AMBULATORY_CARE_PROVIDER_SITE_OTHER): Payer: BC Managed Care – PPO | Admitting: Cardiology

## 2010-11-25 VITALS — BP 124/70 | HR 45 | Resp 18 | Ht 69.0 in | Wt 174.4 lb

## 2010-11-25 DIAGNOSIS — J984 Other disorders of lung: Secondary | ICD-10-CM

## 2010-11-25 DIAGNOSIS — R911 Solitary pulmonary nodule: Secondary | ICD-10-CM

## 2010-11-25 DIAGNOSIS — E7800 Pure hypercholesterolemia, unspecified: Secondary | ICD-10-CM

## 2010-11-25 DIAGNOSIS — I10 Essential (primary) hypertension: Secondary | ICD-10-CM

## 2010-11-25 DIAGNOSIS — I2510 Atherosclerotic heart disease of native coronary artery without angina pectoris: Secondary | ICD-10-CM | POA: Insufficient documentation

## 2010-11-25 HISTORY — DX: Solitary pulmonary nodule: R91.1

## 2010-11-25 MED ORDER — ATENOLOL 50 MG PO TABS: 50.0000 mg | ORAL_TABLET | Freq: Every day | ORAL | Status: DC

## 2010-11-25 MED ORDER — AMLODIPINE BESYLATE 10 MG PO TABS: 10.0000 mg | ORAL_TABLET | Freq: Every day | ORAL | Status: DC

## 2010-11-25 MED ORDER — CLOPIDOGREL BISULFATE 75 MG PO TABS: 75.0000 mg | ORAL_TABLET | Freq: Every day | ORAL | Status: DC

## 2010-11-25 MED ORDER — NITROGLYCERIN 0.4 MG SL SUBL: 0.4000 mg | SUBLINGUAL_TABLET | SUBLINGUAL | Status: DC | PRN

## 2010-11-25 NOTE — Assessment & Plan Note (Signed)
Will get non contrast CT as suggested.

## 2010-11-25 NOTE — Assessment & Plan Note (Signed)
Patient stable at present.  Plan follow up in three months.

## 2010-11-25 NOTE — Assessment & Plan Note (Signed)
Does not tolerate statins.  Will recheck since he has lost weight.

## 2010-11-25 NOTE — Assessment & Plan Note (Signed)
Controlled at present.  

## 2010-11-25 NOTE — Patient Instructions (Signed)
Return for fasting lipid and liver panel 272.2 414.01 Non-Cardiac CT scanning, (CAT scanning), is a noninvasive, special x-ray that produces cross-sectional images of the body using x-rays and a computer. CT scans help physicians diagnose and treat medical conditions. For some CT exams, a contrast material is used to enhance visibility in the area of the body being studied. CT scans provide greater clarity and reveal more details than regular x-ray exams. CT of the CHEST  We will call you with results. Your physician recommends that you schedule a follow-up appointment in: 3 months with Dr.Stuckey

## 2010-11-25 NOTE — Progress Notes (Signed)
HPI:  Patient is in for follow up.  He is doing well, with occasional sharp pains that sound like PVCs.  These are not the same as that which he had leading to admission and a CFX stent.  No particular symptoms.  He had a PCXR which questioned a 7mm nodule at the hospital.  Noncontrast CT was suggested.  Was seen in followup after hospitalization by Dr. Graciela Husbands.     Current Outpatient Prescriptions  Medication Sig Dispense Refill  . amLODipine (NORVASC) 10 MG tablet Take 10 mg by mouth daily.        Marland Kitchen aspirin 81 MG tablet Take 81 mg by mouth daily.        Marland Kitchen atenolol (TENORMIN) 50 MG tablet Take 50 mg by mouth daily.        . clopidogrel (PLAVIX) 75 MG tablet Take 75 mg by mouth daily.        . nitroGLYCERIN (NITROSTAT) 0.4 MG SL tablet Place 0.4 mg under the tongue every 5 (five) minutes as needed.          No Known Allergies  Past Medical History  Diagnosis Date  . MI (myocardial infarction)   . CAD (coronary artery disease)   . S/P CABG (coronary artery bypass graft)   . Dyslipidemia   . Statin intolerance     Past Surgical History  Procedure Date  . Coronary artery bypass graft 2003    x4 LIMA to LAD SVG digonal, OM, PDA  . Coronary stent placement 10/05/2006    4.59mmx 16mm Liberte non DES  . Laparoscopic incisional / umbilical / ventral hernia repair 2004  . Bronchogenic cyst resection 08/06/1997    Family History  Problem Relation Age of Onset  . Hypertension Other   . Heart disease Other   . Kidney cancer Other     History   Social History  . Marital Status: Married    Spouse Name: N/A    Number of Children: N/A  . Years of Education: N/A   Occupational History  . Not on file.   Social History Main Topics  . Smoking status: Former Games developer  . Smokeless tobacco: Not on file  . Alcohol Use:   . Drug Use:   . Sexually Active:    Other Topics Concern  . Not on file   Social History Narrative  . No narrative on file    ROS: Please see the HPI.  All other  systems reviewed and negative.  PHYSICAL EXAM:  BP 124/70  Pulse 45  Resp 18  Ht 5\' 9"  (1.753 m)  Wt 174 lb 6.4 oz (79.107 kg)  BMI 25.75 kg/m2  General: Well developed, well nourished, in no acute distress. Head:  Normocephalic and atraumatic. Neck: no JVD Lungs: Clear to auscultation and percussion. Heart: Normal S1 and S2.  No murmur, rubs or gallops.  Abdomen:  Normal bowel sounds; soft; non tender; no organomegaly Pulses: Pulses normal in all 4 extremities. Extremities: No clubbing or cyanosis. No edema. Neurologic: Alert and oriented x 3.  EKG:  SB, otherwise normal. ASSESSMENT AND PLAN:

## 2010-12-01 ENCOUNTER — Other Ambulatory Visit: Payer: BC Managed Care – PPO | Admitting: *Deleted

## 2010-12-02 ENCOUNTER — Other Ambulatory Visit (INDEPENDENT_AMBULATORY_CARE_PROVIDER_SITE_OTHER): Payer: BC Managed Care – PPO | Admitting: *Deleted

## 2010-12-02 ENCOUNTER — Ambulatory Visit (INDEPENDENT_AMBULATORY_CARE_PROVIDER_SITE_OTHER)
Admission: RE | Admit: 2010-12-02 | Discharge: 2010-12-02 | Disposition: A | Discharge status: Home / Self Care | Payer: BC Managed Care – PPO | Source: Ambulatory Visit | Attending: Cardiology | Admitting: Cardiology

## 2010-12-02 DIAGNOSIS — E785 Hyperlipidemia, unspecified: Secondary | ICD-10-CM

## 2010-12-02 DIAGNOSIS — R911 Solitary pulmonary nodule: Secondary | ICD-10-CM

## 2010-12-02 DIAGNOSIS — I2510 Atherosclerotic heart disease of native coronary artery without angina pectoris: Secondary | ICD-10-CM

## 2010-12-02 DIAGNOSIS — J984 Other disorders of lung: Secondary | ICD-10-CM

## 2010-12-02 LAB — LIPID PANEL: VLDL: 17.4 mg/dL (ref 0.0–40.0)

## 2010-12-02 LAB — HEPATIC FUNCTION PANEL
ALT: 17 U/L (ref 0–53)
Alkaline Phosphatase: 60 U/L (ref 39–117)
Bilirubin, Direct: 0.2 mg/dL (ref 0.0–0.3)
Total Bilirubin: 1.3 mg/dL — ABNORMAL HIGH (ref 0.3–1.2)

## 2010-12-22 NOTE — Assessment & Plan Note (Signed)
[facility]                            CARDIOLOGY OFFICE NOTE   [name], [name]                      MRN:          [id number]  DATE:09/01/2007                            DOB:          June 21, 1936    Steve Gordon is in for follow-up.  To briefly summarize, he had had recently  noticeable drop in his overall exercise tolerance.  He has known  disease.  He had stenting of the right coronary artery.  His LIMA is  atretic.  He has significant disease involving the circumflex, but has  been managed mostly medically.  He has known vein graft disease with a  occlusion of the first diagonal and occluded vein grafts to the PDA and  circumflex systems.  His overall LV function was preserved.  The patient  was able to exercise for 7 minutes on the Bruce protocol, and he stopped  due to significant dyspnea and hypertension.  He denied any chest pain.  There are no significant ST-T wave changes.  The radionuclide imaging  study demonstrated normal homogeneous uptake in all areas of myocardium  with an ejection fraction of 64%.  There was no chest pain, significant  ST depression, nor was there was significant perfusion defect.  He is  fortunately feeling somewhat better.   MEDICATIONS:  Include atenolol 50 mg daily, Zetia 10 mg daily, Plavix 75  mg daily, Norvasc 10 mg daily, and aspirin 81 mg daily.   PHYSICAL:  He is alert and oriented in no distress.  The weight is 194 the blood pressure 140/90, pulse is 60.  The lung fields are clear.  The cardiac rhythm is regular.  There is no significant extremity edema.   IMPRESSION:  1. Coronary artery disease as described in the above catheterization      findings.  2. Negative exercise tolerance test with reasonably good exercise      tolerance and mildly hypertensive response.  3. Hypercholesterolemia with intolerance to statins.   RECOMMENDATIONS:  Continued exercise therapy to the best of his ability.  We talked  about various issues with regard to exercise in part because  of difficulties with his feet.  He has not had significant extremity  edema to suggest something such as pulmonary embolus.  We will continue  to follow him closely.  If there is a change in status consideration  could be given to repeat cardiac catheterization but would like to avoid  this if possible.  With a normal myocardial perfusion study, he should  be able to be managed medically.     Steve Gordon. Riley Kill, MD, [facility]  Electronically Signed    TDS/MedQ  DD: 09/27/2007  DT: 09/27/2007  Job #: [id number]

## 2010-12-22 NOTE — Assessment & Plan Note (Signed)
[facility]ALTHC[facility]                            CARDIOLOGY OFFICE NOTE   WYLAND, RASTETTER                      MRN:          [id number]  DATE:09/16/2008                            DOB:          June 16, 1936    Steve Gordon comes in today for some blood work.  He told our nurse,  Steve Gordon that he had had a visit to the [facility] Emergency Room on  Friday.  He turns out he had a kidney stone that he subsequently thinks  he passed.   During his stay in the emergency room, he had chest tightness and  pressure.  They gave him 1 sublingual nitroglycerin which relieved it in  less than a minute.  He has had no pain since.   He has a history of coronary artery disease status post coronary artery  bypass grafting.  His last stress Myoview was August 23, 2007, which  showed good exercise tolerance, EF was 64%, no ischemia.   His laboratory data from the [facility] ED showed a BUN of 29 and  creatinine of 2.1.  His last creatinine in the chart was normal.  His  urinalysis showed blood.  His hemoglobin was 14.9.  White count was  elevated at 12.7.  Again, he thinks he passed his stone with total  relief of his pain in the ED.  Before that, he had a CT which show  bilateral nephrolithiasis and right UVJ calculus measuring 2.5 mm with  right-sided hydronephrosis.  He has a scheduled Urology visit this week.   I reviewed his EKGs from the ED and they were normal.   PHYSICAL EXAMINATION:  He is in no acute distress today.  His blood  pressure is 126/78, his pulse 58 and regular.  Weight is 200.  HEENT is  normal.  Neck is supple.  Carotid upstrokes were equal bilaterally  without bruits, no JVD.  Thyroid is not enlarged.  Trachea is midline.  Lungs are clear to auscultation and percussion.  Heart reveals a poorly  appreciated PMI.  Normal S1 and S2.  Abdominal exam is protuberant with  good bowel sounds.  No midline bruit.  Extremities reveal no cyanosis,  clubbing, or edema.  Pulses are intact.  Neuro exam is intact.  Skin is  unremarkable.   I have reviewed his medications.   ASSESSMENT AND PLAN:  Steve Gordon had obviously an acute kidney stone  last Friday.  This was proven by CT scan and also an elevated BUN and  blood in his urine.  He thinks he passed it.  He did have some angina  which was probably related to all of his pain from his kidney stone.  His EKG is normal.  He has had no further pain.   PLAN:  1. Renew his sublingual nitroglycerin.  2. Check BMET.  He has also had a lipid panel and a liver panel drawn      today.  3. See Dr. Riley Kill back in 4 weeks.  4. Follow up with Urology this week.  5. If he has any recurrent angina at  rest, he knows to report in the      emergency room.     Thomas C. Daleen Squibb, MD, [facility]  Electronically Signed    TCW/MedQ  DD: 09/16/2008  DT: 09/17/2008  Job #: [id number]

## 2010-12-22 NOTE — Assessment & Plan Note (Signed)
[name]                            CARDIOLOGY OFFICE NOTE   Steve Gordon, Steve Gordon                      MRN:          [id number]  DATE:08/11/2007                            DOB:          July 01, 1936    Steve Gordon is in for a followup visit.  In general, he has been stable.  However, he has noted a substantial drop in his ability to exercise.  He  initially went for 30 minutes on the bike after he had his intervention  but more recently he has had trouble even getting to 15 minutes without  getting extremely fatigued.  He denies any ongoing chest pain.  Importantly, the patient underwent catheterization and he ultimately  underwent stenting of the right coronary artery.  At that time he had a  4-mm stent placed.  He has not had the same kind of symptoms he had  prior to this event, but clearly he has had some.   MEDICATIONS:  1. Atenolol 50 mg daily.  2. Zetia 10 mg daily.  3. Plavix 75 mg daily.  4. Norvasc 10 mg daily.  5. Aspirin 81 mg daily.   PHYSICAL EXAMINATION:  GENERAL:  He is alert and oriented.  VITAL SIGNS:  Blood pressure is 140/80, pulse is 50.  LUNGS:  Fields are clear.  CARDIAC:  Rhythm is regular without a significant murmur.   The electrocardiogram demonstrates normal sinus rhythm.  There are no  acute changes noted.   To briefly summarize, this gentleman demonstrates some worrisome  symptoms that could be due to ischemia.  Specifically, he has had  declining exercise tolerance over the past few months.  I plan to have  him return in followup in a couple of weeks and in the interim, I think  we are going to go ahead and try to do a stress Cardiolite.  He may have  some difficulty with this because of the repair of his foot.  However, I  would prefer to do this as opposed to an adenosine if he can exercise.  While he has not had the typical symptoms he had previously, in the  setting of restenosis this could certainly be the  primary manifestation.  We do not have any other good reason for his current symptoms.  As a  result, we will try to do a functional study.  If it is markedly  abdomen, then we would consider cardiac catheterization.  If there are  no significant profusion defects, continued medical therapy will be  recommended.     Arturo Morton. Riley Kill, MD, [facility]  Electronically Signed    TDS/MedQ  DD: 08/11/2007  DT: 08/11/2007  Job #: [id number]

## 2010-12-22 NOTE — Assessment & Plan Note (Signed)
[facility]                            CARDIOLOGY OFFICE NOTE   QUINNTIN, MALTER                      MRN:          [id number]  DATE:01/25/2008                            DOB:          Feb 07, 1936    Mr. Senters is in for followup.  He is doing well.  He denies any ongoing  or active symptoms at the present time.  He still remains fairly limited  from orthopedic issues predominantly.   MEDICATIONS:  1. Atenolol 50 mg daily.  2. Zetia 10 mg daily.  3. Plavix 75 mg daily.  4. Norvasc 10 mg daily.  5. Aspirin one daily.   PHYSICAL EXAMINATION:  GENERAL:  He is alert and oriented.  VITAL SIGNS:  Blood pressure is 110/76 and pulse 53.  LUNGS:  The lung fields are clear.  CARDIAC:  Rhythm is regular.   IMPRESSION:  1. Status post coronary artery bypass graft surgery.  2. Hypercholesterolemia with statin intolerance.  3. Status post stenting of the mid right coronary artery using a liver      tape bare-metal stent in October 05, 2006.   PLAN:  1. Return to clinic in 3 to 6 months.  2. Continue current medical regimen.   ADDENDUM:  EKG reveals sinus bradycardia, otherwise unremarkable.     Arturo Morton. Riley Kill, MD, [facility]  Electronically Signed    TDS/MedQ  DD: 02/12/2008  DT: 02/13/2008  Job #: [id number]

## 2010-12-22 NOTE — Letter (Signed)
August 11, 2007    [facility] and [facility]  [address]  [place], [place] [address]   RE:  ABE, SCHOOLS  MRN:  [id number]  /  DOB:  09-22-1935   Dear Mr. and Mrs. Caiazzo,   Thanks so much for the very nice gift that you brought to the office.  I  will use the computer bag, and I very much appreciate it.  Thanks so  much for thinking of me and I look forward to seeing you again in the  near future.   Warmest regards.    Sincerely,      Arturo Morton. Riley Kill, MD, [facility]  Electronically Signed    TDS/MedQ  DD: 08/11/2007  DT: 08/11/2007  Job #: [id number]

## 2010-12-22 NOTE — Assessment & Plan Note (Signed)
[facility]                            CARDIOLOGY OFFICE NOTE   AZAM, GERVASI                      MRN:          [id number]  DATE:02/23/2007                            DOB:          November 23, 1935    Mr. Broady is in for followup.  He is feeling really quite well.  He  denies any chest pain or significant shortness of breath.  He has been  able to do most things without difficulty.  He has been working on a  number of model T cars.   MEDICATIONS:  1. Atenolol 50 mg daily.  2. Zetia 10 mg daily.  3. Plavix 75 mg daily.  4. Norvasc 10 mg daily.  5. Aspirin 325 mg daily.   PHYSICAL EXAMINATION:  VITAL SIGNS:  The blood pressure is 110/71, pulse  51, weight is 193.  NECK:  No carotid bruits.  LUNGS:  Fields clear to auscultation and percussion.  CARDIAC:  Rhythm is regular.  There is no significant murmur currently.   EKG reveals sinus bradycardia, otherwise within normal limits.   IMPRESSION:  1. Coronary artery disease, status post coronary artery bypass graft      surgery with subsequent percutaneous stenting using a bare metal      stent.  2. Hypercholesterolemia with Statin intolerance.  3. Preserved overall left ventricular function.   PLAN:  1. Return to clinic in 6 months.  2. Continue current medical regimen, other than decreasing aspirin to      81 mg daily.  3. Continue Zetia.     Arturo Morton. Riley Kill, MD, [facility]  Electronically Signed    TDS/MedQ  DD: 02/23/2007  DT: 02/23/2007  Job #: [id number]

## 2010-12-22 NOTE — Assessment & Plan Note (Signed)
[facility]ARE                            CARDIOLOGY OFFICE NOTE   Steve Gordon, Steve Gordon                      MRN:          [id number]  DATE:07/25/2008                            DOB:          04-02-1936    Steve Gordon is in for followup.  He really is doing quite well.  We had an  extensive discussion today about his Zetia.  We talked about various  options including continuing or discontinuing therapy.  He has clearly  been intolerant to statins in the past.   CURRENT MEDICATIONS:  1. Atenolol 50 mg daily.  2. Zetia 10 mg daily.  3. Plavix 75 mg daily.  4. Norvasc 10 mg daily.  5. Aspirin 81 mg daily.   PHYSICAL EXAMINATION:  GENERAL:  He is alert and oriented, the weight is  198 pounds.  VITAL SIGNS:  Blood pressure 126/74, pulse is 54.  LUNGS:  The lung fields are clear.  CARDIAC:  Rhythm is regular.  EXTREMITIES:  No definite edema.  He does have some wasting of the left  lower extremity due to prior nerve injury.   Electrocardiogram demonstrates sinus bradycardia, otherwise within  normal limits.   IMPRESSION:  1. Coronary artery disease with prior coronary artery bypass graft      surgery with internal mammary to the left anterior descending,      saphenous vein graft to diagonal, saphenous vein graft to the      obtuse marginal, saphenous obtuse marginal to posterior descending      artery.  2. Dyslipidemia.  3. Statin intolerance.  4. Hypertension.  5. Anterior wall myocardial infarction in 1998.  6. History of cholecystitis status post laparoscopic cholecystectomy.  7. Prior resection of cystic mass for bronchogenic cyst in 1998, Dr.      Barry Dienes  8. Radionuclide imaging study, August 23, 2007, demonstrating no      evidence of chest pain, significant ST-T segment, depression or      ischemia with ejection fraction of 64%   PLAN:  1. Return to clinic in 6 months.  2. Discontinue Zetia after thorough discussion of options.  3. We  will check a repeat lipid and liver profile and talk about      options after that.   ADDENDUM:  EKG reveals sinus bradycardia, otherwise normal limits.     Steve Gordon. Riley Kill, MD, [facility]  Electronically Signed    TDS/MedQ  DD: 07/25/2008  DT: 07/26/2008  Job #: [id number]

## 2010-12-22 NOTE — Assessment & Plan Note (Signed)
[facility]                            CARDIOLOGY OFFICE NOTE   Steve Gordon, Steve Gordon                      MRN:          [id number]  DATE:10/18/2007                            DOB:          [birth year]    Steve Gordon is in for followup.  In general he has been stable.  He is  not been having any chest pain.  He still has had trouble getting his  activity going but he has exchanged his bike out for something that is  somewhat easier to pedal.  He has started this to some extent.  He has  not had increased symptoms.   MEDICATIONS:  Include atenolol 50 mg daily, Zetia 10 mg daily, Plavix 75  mg daily, Norvasc 10 mg daily and aspirin 81 mg daily.   On physical blood pressure is 122/70, pulse is 70, the lung fields are  clear.  The cardiac rhythm is regular.   Overall he is improved.  He feels relatively comfortable that he can be  treated medically at the present time.  We will continue to do so.  We  will not change any of his medicines and we will see him back in  followup in 3-4 months.  Should he have any problems in the interim he  is to contact us promptly.  We talked about repeat cardiac  catheterization, but I encouraged him to try to remain as active as  possible so we could get a better handle on whether he really has  declining exercise tolerance as a specific cardiac symptom or whether it  is related a little bit more to the mechanics.  Specifically, his  activity had fallen off, and he had not been able to use his bike and  this started much after that had occurred.  As a result, we will  continue to monitor him and he knows to call should any problems arise.     Arturo Morton. Riley Kill, MD, [facility]  Electronically Signed    TDS/MedQ  DD: 10/18/2007  DT: 10/19/2007  Job #: [id number]

## 2010-12-25 NOTE — Discharge Summary (Signed)
NAME:  Steve Gordon, Steve Gordon                         ACCOUNT NO.:  [id number]   MEDICAL RECORD NO.:  [id number]                   PATIENT TYPE:  INP   LOCATION:  2020                                 FACILITY:  [facility]   PHYSICIAN:  Salvatore Decent. Cornelius Moras, M.D.              DATE OF BIRTH:  05/20/1936   DATE OF ADMISSION:  11/29/2002  DATE OF DISCHARGE:  12/01/2002                                 DISCHARGE SUMMARY   ADMISSION DIAGNOSIS:  Ventral incisional epigastric hernia.   PAST MEDICAL HISTORY:  1. Coronary artery disease, status post myocardial infarction 5/98, status     post coronary artery bypass grafting 8/03 by Dr. Cornelius Moras.  2. Hypertension.  3. History of bronchogenic cyst, status post right VATS and bronchogenic     cyst resection 12/98 by Dr. Cornelius Moras.  4. Hyperlipidemia.  5. Other surgical history includes open reduction and internal fixation left     ankle, and removal of lipoma.  Right inguinal hernia repair x3 by Dr. Joanne Gavel.   ALLERGIES:  He has no known drug allergies, however is sensitive to ACE  INHIBITORS, LESCOL causes myalgia and he is also sensitive to RETAVASE.   DISCHARGE DIAGNOSIS:  Ventral incisional epigastric hernia, status post  repair.   BRIEF HISTORY:  Mr. Tucholski is a 75 year old man who had coronary bypass in  8/03 by Dr. Cornelius Moras.  He recovered well from his surgery.  In 12/03 he slipped  on a wet floor and fell on his chest.  He subsequently felt discomfort and a  bulging in his upper abdomen just below his sternal incision.  He was seen  by Dr. Cornelius Moras 1/04 and diagnosed with an epigastric hernia.  This was followed  at the office over the next three months, during this time it has continued  to bother him and during his visit on 11/12/02 Dr. Cornelius Moras recommended elective  repair using mesh.   HOSPITAL COURSE:  On 11/29/02 Mr. Parthasarathy was electively admitted to [facility] to the care of Dr. Tressie Stalker.  He underwent the following  surgical procedure.   Repair of epigastric hernia with Prolene mesh.  An 8 mm  Jackson-Pratt drain was placed at that time.  He tolerated the procedure  well and was transferred in stable condition to the PACU.  He remained  hemodynamically stable in the immediate postoperative period.  On the morning of postoperative day one Mr. Oftedahl reports feeling very  well.  His vital signs are stable with blood pressure 133/82, he is  afebrile.  His room air saturation is 94%.  His heart showed normal sinus  rhythm and his lungs are clear.  His incision is clean and dry and the  Jackson-Pratt drain was with approximately 4 cc output since midnight.  He  is tolerating his diet without nausea.  He is ambulating independently.  His  pain control is adequate.  Since his drain is still putting out some serous  fluid it was left in for today.  It is expected this drainage will decrease  within the next 24 hours and should be able to be removed tomorrow morning,  11/30/02 and it is anticipated he will be ready for discharge later that  morning.   CONDITION ON DISCHARGE:  Improved.   DISCHARGE MEDICATIONS:  He is to resume his following home medications of  Norvasc 5 mg p.o. q.d., Atenolol 50 mg p.o. q.d., Zetia 10 mg p.o. q.d.  For  pain he may have Tylox 1-2 p.o. q.4-6h p.r.n. for severe pain or Tylenol 325  mg 1-2 p.o. q.4-6h for mild pain.   ACTIVITY:  He has been asked to refrain from any heavy lifting.   DIET:  Should remain a heart healthy diet.   WOUND CARE:  He should wash the incision daily with mild soap and water.  He  may begin showering Tuesday, 12/04/02.  If his incision is red hot, swollen  or draining, or if he has fever of greater than 101 degrees Fahrenheit he  should call Dr. Orvan July office.   FOLLOW UP:  He has an appointment to see Dr. Barry Dienes in [facility] office on Monday,  12/24/02 at 11:30 in the morning.     Toribio Harbour, R.N.                  Salvatore Decent. Cornelius Moras, M.D.    CTK/MEDQ  D:  11/30/2002   T:  12/03/2002  Job:  [id number]   cc:   Titus Dubin. Alwyn Ren, M.D. [facility]

## 2010-12-25 NOTE — H&P (Signed)
[facility] ADMISSION   Steve Gordon, Steve Gordon                      MRN:          [id number]  DATE:10/04/2006                            DOB:          1935-12-01    CHIEF COMPLAINT:  Chest pain.   HISTORY OF PRESENT ILLNESS:  This is a 75 year old married white male  long-term patient of Dr. Bonnee Quin who presents with 10-day history of  chest pain.  He has a history of coronary artery disease status post  anterior MI in June 1998 treated with urgent PCI, followed by CABG x4 in  August 2003 with a LIMA to the LAD, SVG to the diagonal one, SVG to the  OM-1, and SVG to the PDA.  He has done quite well over the years without  recurrent symptoms and his last nuclear study was back in 2001.   He presents today with new onset of chest pain.  He describes it as a  tightening across his chest, down his left arm, exactly the same pain  that he had prior to his bypass surgery.  It occurs both with exertion  and at rest.  He had three episodes this morning at rest.  It awakened  him at 4 a.m. and he had three episodes that lasted 3-4 minutes a piece.  He has used Aleve over the past 10 days but has not tried nitroglycerin  because he was afraid to.  He denies any dizziness or diaphoresis with  this.  He says he takes some deep breaths so does not feel acutely short  of breath during these episodes.  He is quite inactive as he has had  left ankle surgery a year or two ago and has had trouble walking since.   ALLERGIES:  No known drug allergies.   CURRENT MEDICATIONS:  1. Atenolol 50 mg daily.  2. Zetia 10 mg daily.  3. Aspirin 325 mg daily.  4. Norvasc 10 mg daily.   PAST MEDICAL HISTORY:  Significant for dyslipidemia, STATIN intolerance,  and hypertension, left ankle surgery.  He has no history of thyroid  disease, peptic ulcer disease, cancers, or strokes.   SOCIAL HISTORY:  He is married.  He has two children,  one stepson, three  grandchildren.  He quit smoking 40 years ago.  He is retired.  He is  inactive.   FAMILY HISTORY:  Two brothers have hypertension, mother died of kidney  cancer.   REVIEW OF SYSTEMS:  Negative for dizziness or presyncopal signs or  symptoms, dyspepsia, dysphagia, nausea, vomiting, change in bowels, or  melena.  CARDIOPULMONARY:  Please see HPI.   PHYSICAL EXAMINATION:  GENERAL:  This is a pleasant 75 year old white  male with unstable angina.  VITAL SIGNS:  Blood pressure 119/70, pulse 62, weight 202.  HEENT:  Head is normocephalic, without sign of trauma.  Extraocular eye  movements intact.  Pupils are equally reactive to light and  accommodation.  Nasal mucosa is moist.  Throat is without erythema or  exudate.  NECK:  Without JVD, HJR, bruit or thyroid enlargement.  LUNGS:  Clear anterior, posterior and lateral.  HEART:  Regular rate and rhythm at 62 beats per minute.  Normal S1, S2  with a soft 1/6 systolic murmur at the apex in the left sternal border.  ABDOMEN:  Soft without organomegaly, masses, lesions, or abnormal  tenderness.  EXTREMITIES:  Without cyanosis, clubbing or edema.  He has good distal  pulses.  NEUROLOGIC:  Exam is without focal deficit.   EKG:  Sinus bradycardia, nonspecific ST changes, no acute change from  prior tracing.   IMPRESSION:  1. Unstable angina.  2. Coronary artery disease status post anterior wall myocardial      infarction treated with percutaneous coronary intervention in 1998.  3. Status post coronary artery bypass grafting x4 with a left internal      mammary artery to the left anterior descending artery, saphenous      vein graft to the obtuse marginal one, saphenous vein graft to the      posterior descending coronary artery, and saphenous vein graft to      the diagonal in 2003.  4. Hypertension.  5. Hyperlipidemia.  6. Left ankle surgery with limited mobility.   PLAN AT THIS TIME:  Admit this patient to the  hospital with unstable  angina, start IV heparin and nitroglycerin, and set him up for a cardiac  catheterization.      Jacolyn Reedy, PA-C  Electronically Signed      Rollene Rotunda, MD, [facility]  Electronically Signed   ML/MedQ  DD: 10/04/2006  DT: 10/04/2006  Job #: [id number]

## 2010-12-25 NOTE — Op Note (Signed)
NAME:  Steve Gordon, Steve Gordon                         ACCOUNT NO.:  [id number]   MEDICAL RECORD NO.:  [id number]                   PATIENT TYPE:  INP   LOCATION:  2306                                 FACILITY:  [facility]   PHYSICIAN:  Salvatore Decent. Cornelius Moras, M.D.              DATE OF BIRTH:  19-Apr-1936   DATE OF PROCEDURE:  04/06/2002  DATE OF DISCHARGE:                                 OPERATIVE REPORT   PREOPERATIVE DIAGNOSIS:  Severe three-vessel coronary artery disease with  progressive exertional angina.   POSTOPERATIVE DIAGNOSIS:  Severe three-vessel coronary artery disease with  progressive exertional angina.   OPERATION:  Median sternotomy for coronary artery bypass grafting x4 (left  internal mammary artery to distal left anterior descending coronary artery,  saphenous vein graft to the first diagonal branch, saphenous vein graft to  first circumflex marginal branch, saphenous vein graft to posterior  descending coronary artery).   SURGEON:  Salvatore Decent. Cornelius Moras, M.D.   FIRST ASSISTANT:  Jody P. Diamond Nickel.   ANESTHESIA:  General.   BRIEF CLINICAL NOTE:  The patient is a 75 year old gentleman followed by Dr.  Marga Melnick and Dr. Bonnee Quin, with a history of coronary artery  disease, hypertension, and hypercholesterolemia. He presents with symptoms  consistent with exertional angina. Catheterization performed by Dr. Riley Kill  demonstrates severe three-vessel coronary artery disease and preserved left  ventricular function. A full consultation note has been dictated previously.  The patient and his wife have been counseled at length regarding the  indications and potential benefits of coronary artery bypass grafting.  Alternative treatment strategies have been discussed. They understand and  accept all associated risks of surgery including, but not limited to, risks  of death, stroke, myocardial infarction, respiratory failure, arrhythmia,  bleeding requiring blood transfusion,  infection, and recurrent coronary  artery disease. All of their questions have been addressed.   OPERATIVE NOTE IN DETAIL:  The patient is brought to the operating room on  the above-mentioned date and central monitoring was established by the  anesthesia service under the care and direction of Dr. Arta Bruce.  Specifically, a Swan-Ganz catheter was placed through the right internal  jugular approach.  A radial arterial line was placed. Intravenous  antibiotics are administered. Following induction, with general endotracheal  anesthesia, the patient's chest, abdomen, both groins, and both lower  extremities are prepared and draped in a sterile manner.   A median sternotomy incision is performed and the left internal mammary  artery is dissected  from the chest wall and prepared for bypass grafting.  The left internal  mammary artery is notable good quality conduit. Simultaneously, saphenous  vein is obtained from the patient's right side and the upper portion of the  right lower leg using endoscopic vein harvest technique. The saphenous is  notably good quality conduit. The patient was heparinized systemically.   The pericardium is opened. There are severe adhesions throughout  the  pericardial space, likely related to the patient's previous surgery for  resection of a bronchogenic cyst. Sharp dissection is utilized to  lyse all of the adhesions throughout the pericardial space. This is somewhat  tedious in similar to that which occurs during re-do coronary artery bypass  grafting. The ascending aorta is palpated and is notably free of any  palpable plaques or calcifications. The ascending aorta and the right atrium  are cannulated for cardiopulmonary bypass.  Adequate heparinization  verified.  Cardiopulmonary bypass is begun and the remainder of the  adhesions in the pericardial space are lysed using sharp dissection. The  surface of the heart is inspected.  Distal sites are selected  for coronary  bypass grafting. Of note, the patient has diffuse coronary artery disease  with severe atherosclerotic plaque throughout all of the epicardial coronary  arteries. Distal sites are selected for coronary artery bypass grafting.  Portions of saphenous vein in the left internal mammary artery are trimmed  to appropriate lengths.  A temperature probe is placed in the left  ventricular septum and styrofoam pad is placed to protect the left phrenic  nerve from thermal injury.  The saphenous vein and left internal mammary  artery are trimmed to appropriate lengths.  The cardioplegia cannula is  placed in the ascending aorta.   The patient is cooled to 30 degrees systemic temperature.  The aortic cross-  clamp is applied and cardioplegia is delivered in an antegrade fashion  through the aortic root. Iced saline flush is applied for topical  isothermia. The initial cardioplegic arrest and myocardial cooling are felt  to be excellent. Repeat doses of cardioplegic are administered  intermittently throughout the cross-clamp portion of the operation, both  through the aortic root and down subsequently placed vein grafts to maintain  septal temperature below 15 degrees Centigrade.   The following distal coronary anastomoses are performed:  1. The posterior descending coronary artery is grafted with a saphenous vein     graft in an end-to-side fashion. This coronary measures 1.5 mm in     diameter and is of fair quality.  2. The first circumflex marginal branch is grafted with a saphenous vein     graft in an end-to-side fashion. This coronary measures 1.7 mm in     diameter, but is of poor quality. There is diffuse plaque throughout this     vessel making arteriotomy choice difficult. Ultimately, a relatively long     anastomosis with patch angioplasty of the vessel is performed.  3. The first diagonal branch of the left anterior descending coronary artery    is grafted with a saphenous  vein graft in an end-to-side fashion. This is     a bifurcating system and the medial sub-branch is a branch which is     grafted. This vessels measures 1.2 mm in diameter and is of poor quality.  4. The distal left anterior descending coronary artery is grafted with the     left internal mammary artery in an end-to-side fashion. This coronary     measures 1.2 mm in diameter and is of poor quality.   All the epicardial coronary arteries are of poor quality, although the  posterior descending coronary artery  is perhaps slightly improved. All  three proximal saphenous vein anastomoses are performed directly to the  ascending aorta prior to removal of the aortic cross-clamp. All air is  evacuated from the aortic root. The aortic cross-clamp is removed after a  total  cross-clamp time of 82 minutes. The septal temperature is noted to  rise rapidly and dramatically upon reperfusion of the left internal mammary  artery.   The heart begins to beat spontaneously without need for cardioversion. All  proximal and distal anastomoses are inspected for hemostasis and appropriate  graft orientation. Epicardial pacing wires are fixed to the right  ventricular outflow tract into the right atrial appendage. The patient is  rewarmed to greater than 37 degrees Centigrade temperature. The patient is  weaned from cardiopulmonary bypass without difficulty. The patient's rhythm  at separation from bypass is junctional rhythm requiring AV sequential  pacing.  Low-dose dopamine infusion is employed. Total cardiopulmonary  bypass time for the operation is 126 minutes.  Normal sinus rhythm resumed  spontaneously shortly after separation from bypass and pacing is  discontinued.   The venous and arterial cannulae are removed uneventfully.  Protamine is  administered to reverse the anticoagulation.  The mediastinum and the left  chest are irrigated with saline solution containing vancomycin.  Meticulous  surgical  hemostasis is ascertained.  There is diffuse coagulopathy.  Mediastinum and the left chest are drained with three chest tubes placed  through separate stab incisions inferiorly.  After satisfactory hemostasis  is achieved, the mediastinum is closed in a routine fashion. The right lower  extremity incisions are closed in multiple layers in a routine fashion. All  skin incisions are closed with subcuticular skin closures. The patient  tolerated the procedure well and is transported to the surgical intensive  care unit in stable condition.  There are no intraoperative complications.  All sponge, instrument and needle counts are verified correct at the  completion of the operation.  No blood products were administered.                                                            Salvatore Decent. Cornelius Moras, M.D.    CHO/MEDQ  D:  04/06/2002  T:  04/10/2002  Job:  [id number]   cc:   Arturo Morton. Riley Kill, M.D. [facility]   Titus Dubin. Alwyn Ren, M.D. [facility]

## 2010-12-25 NOTE — Discharge Summary (Signed)
NAME:  Steve Gordon, Steve Gordon NO.:  [id number]   MEDICAL RECORD NO.:  [id number]          PATIENT TYPE:  INP   LOCATION:  3709                         FACILITY:  [facility]   PHYSICIAN:  Arturo Morton. Riley Kill, M.D. [facility] OF BIRTH:  09/27/1935   DATE OF ADMISSION:  01/09/2005  DATE OF DISCHARGE:  01/15/2005                                 DISCHARGE SUMMARY   PRINCIPAL DIAGNOSIS:  Cholelithiasis.   SECONDARY DIAGNOSES:  Coronary artery disease, hypertension, hyperlipidemia,  remote tobacco abuse.   ALLERGIES:  -STATINS, __________, ACE INHIBITORS, CALCIUM CHANNEL BLOCKERS.   IMAGING PROCEDURES:  Adenosine Myoview.   PROCEDURE:  Laparoscopic cholecystectomy.   HISTORY OF PRESENT ILLNESS:  This is a 75 year old white male who prior to  admission had a several-day history of left arm tingling and feeling not  right, which he attributed to arthritis.  On June 3, he developed anterior  chest tightness and epigastric discomfort associated with nausea.  The  patient took two sublingual nitroglycerin at home without relief and then  called EMS.  EMS gave him three additional sublingual nitroglycerin,  reducing his pain to 0/10 approximately 45 minutes after onset.  The  patient's symptoms were similar to MI.  In the ED, ECG was without changes  and cardiac enzymes were normal.  He was admitted for further evaluation.   HOSPITAL COURSE:  Following admission, he was noted to have elevated AST and  ALT with an AST of 111 and ALT of 60.  Abdominal ultrasound was performed,  revealing gallstones without acute biliary disease.  He underwent an  adenosine Myoview on June 6 which showed a fixed anterior and inferior  defect.  These defects were felt to be secondary to prior anterior MI as  well as diaphragmatic attenuation.  Surgery was consulted, given his  cholelithiasis, and he underwent successful laparoscopic cholecystectomy on  June 8.  He tolerated the procedure well and this  morning has no additional  complaints.  He is being discharged home today in satisfactory condition.   LABORATORIES:  Hemoglobin 12.8, hematocrit 37.3, WBC 8.1, platelets 281,000,  MCV 93,6.  Sodium 140, potassium 4.3, chloride 113, CO2 25, BUN 22,  creatinine 1.4, glucose 105.  PT 12.5, INR 0.9, PTT 28.  Total bilirubin  1.0, alkaline phosphatase 67, AST 21, ALT 34.  Albumin 3.3.  Cardiac enzymes  negative x3.  Total cholesterol 165, triglycerides 98, HDL 36, LDL 109.  Calcium 8.4.   DISPOSITION:  The patient is being discharged home in good condition.   FOLLOWUP:  He is asked to follow up with his primary care physician, Dr.  Alwyn Ren in 1-2 weeks.  He will also have an appointment with Dr. Leonie Man in 2-3 weeks for surgical followup.   DISCHARGE MEDICATIONS:  1.  Zetia 10 mg daily.  2.  Norvasc 10 mg daily.  3.  Atenolol 50 mg daily.  4.  Aspirin 325 mg daily.  5.  Nitroglycerin 0.4 mg sublingually p.r.n. chest pain.  6.  Vicodin 5/500 mg 1-2 tabs q.4h. p.r.n.   PENDING LABORATORIES:  None.   DURATION OF  DISCHARGE ENCOUNTER:  Forty minutes.       CRB/MEDQ  D:  01/15/2005  T:  01/15/2005  Job:  [id number]

## 2010-12-25 NOTE — Cardiovascular Report (Signed)
NAME:  Steve Gordon, Steve Gordon NO.:  [id number]   MEDICAL RECORD NO.:  [id number]          PATIENT TYPE:  INP   LOCATION:  2807                         FACILITY:  [facility]   PHYSICIAN:  Veverly Fells. Excell Seltzer, MD  DATE OF BIRTH:  11-03-1935   DATE OF PROCEDURE:  10/05/2006  DATE OF DISCHARGE:                            CARDIAC CATHETERIZATION   PROCEDURE:  Left heart catheterization, selective coronary angiography,  saphenous vein graft angiography, LIMA angiography, LV angiography and  PTCA and stenting of the right coronary artery.   INDICATIONS:  Mr. Nehme is a 75 year old gentleman with known coronary  artery disease status post percutaneous coronary intervention in the  remote past and more recent coronary bypass surgery.  He presented with  chest discomfort that felt very similar to his previous anginal pain.  He ruled out for myocardial infarction but had a typical history of  unstable angina and was subsequently referred for cardiac  catheterization.   PROCEDURAL DETAILS:  Risks and indications of the procedure were  explained to the patient.  Informed consent was obtained.  The right  groin was prepped and draped and anesthetized with 1% lidocaine.  Using  the modified Seldinger technique, a 6-French sheath was placed in the  right femoral artery.  Native coronary angiography was performed with  standard Judkins catheters.  Following native vessel angiography, the JR-  4 catheter was used to image of the patient's three saphenous vein  grafts as well as his LIMA graft.  Following native and graft  angiography, an angled pigtail catheter was inserted into the left  ventricle and pressures were recorded.  Left ventriculogram was  performed in the 30 degrees right anterior oblique projection.  Pullback  across the aortic valve was done.  Following the diagnostic procedure, I  elected to intervene on the patient's native right coronary artery.  He  has a very tortuous  proximal right coronary artery with a 99% lesion in  the mid vessel.  Angiomax was used for anticoagulation.  The patient was  preloaded with 600 mg of clopidogrel.  The patient's right femoral  sheath was changed out for a long femoral sheath to get more support  during the intervention.  This was performed over a guidewire.  Once a  therapeutic ACT was achieved, a 6-French JR-4 catheter was inserted and  a Cougar guidewire was used to pass the lesion and placed in the distal  vessel.  The lesion was initially predilated with 2.5 x 15 mm maverick  balloon to 10 atmospheres.  It was then dilated with a 3.5 x 15 mm  Maverick balloon up to 10 atmospheres.  Following predilatation, the  balloon appeared well expanded and there was TIMI III flow in the  vessel.  I initially attempted to place a 4-0 x 18 mm driver stent.  However, the stent would not pass through the sheath and part of the  stent apparatus became unraveled.  The stent was removed, and the stent  shaft appeared to have partially unraveled.  The stent itself was  unharmed.  At that point, a 4-0 x 16  mm Liberte stent was placed in the  vessel, and with a great deal of difficulty was advanced down to the  lesion.  This required deep seeding of the guiding catheter to get the  stent down to the lesion.  The stent was placed and deployed at 16  atmospheres.  It was then post dilated with a 4-5 x 12 mm Quantum  Maverick balloon which was dilated at 20 atmospheres.  After post  dilatation, there was excellent stent expansion and TIMI III flow  throughout the vessel.  The guiding catheter was removed and I attempted  to exchange the sheath out for a short sheath, but the wire actually  pokes through the side of the sheath an area of a kink and went down the  SFA as opposed to up the aorta.  At that point, I attempted to redirect  the wire up to the aorta but was unable to advance it.  Everything was  pulled out and manual pressure used  for hemostasis.  The patient's  Angiomax was discontinued.  A fem stop was placed and there was good  hemostasis with no hematoma formation.   FINDINGS:  Aortic pressure 130/78 with a mean of 101, left ventricular  pressure 127/24 with end-diastolic pressure of 28.   CORONARY ANGIOGRAPHY:  The left mainstem is mildly calcified and it  bifurcates into the LAD and left circumflex.  There is no obstructive  disease in the left main.  LAD is a large-caliber vessel that courses  down to the apex.  There is a stent in the proximal portion that has  diffuse restenosis of no greater than 50%.  The first diagonal branch  coming out of the stented segment is diffusely diseased.  The mid LAD is  also diffusely diseased with mild nonobstructive disease.  The LIMA  graft, which is anastomosed to the mid LAD fills in a retrograde fashion  from native vessel flow.  The remaining portions of the mid and distal  LAD are free of any significant angiographic disease.   Left circumflex is a large-caliber vessel.  The proximal portion is  diffusely diseased without any significant high-grade obstruction.  The  second obtuse marginal branch is large-caliber and has a 75-80% stenosis  involving its ostium.  That vessel bifurcates into twin vessels and  supplies much of the posterolateral wall.  The true circumflex then  courses down the AV groove and gives off a left posterolateral branch.  That vessel has mild diffuse nonobstructive disease.   The right coronary artery is extremely tortuous.  There is a shepherd's  hook in the proximal portion.  There is nonobstructive disease through  that area.  In the mid vessel, there is a 99% stenosis.  The remaining  portions of the mid and distal right coronary arteries are ectatic in  appearance.  Distally the right coronary artery bifurcates into the PDA  and posterior AV segment which gives off two posterolateral branches. PDA is diffusely diseased vessel that  has 30-50% lesions throughout.   Saphenous vein graft angiography demonstrated a patent saphenous vein  graft to the first diagonal branch.  This vein appeared small.  The  saphenous vein graft to the obtuse marginal branch as well as the  saphenous vein graft to the right PDA were both occluded at the aortic  anastomosis.  The LIMA to LAD was atretic but it did not appear to have  any area of occlusion in it.  </LEFT VENTRICULOGRAPHY>  Left  ventriculography demonstrated normal left ventricular function.  The left ventricular ejection fraction is estimated at 60%.   CONCLUSIONS:  1. Three-vessel native coronary artery disease with mild disease      throughout the LAD, moderate disease in the left circumflex and      high-grade focal disease in the right coronary artery.  2. Status post coronary bypass surgery with a patent saphenous vein      graft to first diagonal branch and occluded vein grafts to the      right PDA and left circumflex system as well as an atretic LIMA      graft.  3. His severe native right coronary artery stenosis which is likely      the patient is culprit vessel.  4. Is normal left ventricular function.   DISCUSSION:  As described above, PCI of the right coronary artery was  performed with a bare metal stent.  I think the patient could be managed  medically for the remainder of his coronary artery disease.  His LIMA  graft is a likely atretic due to good antegrade flow through the LAD.  The obtuse marginal branch and left circumflex has high-grade  disease, but it is grossly unchanged and the patient has previously been  asymptomatic.  I think if he responds well to this intervention he may  not require any further interventions.   He should remain on aspirin lifelong and clopidogrel for a minimum of 30  days.      Veverly Fells. Excell Seltzer, MD  Electronically Signed     MDC/MEDQ  D:  10/05/2006  T:  10/05/2006  Job:  [id number]   cc:   Arturo Morton. Riley Kill, MD,  [facility]

## 2010-12-25 NOTE — Assessment & Plan Note (Signed)
[facility]                              CARDIOLOGY OFFICE NOTE   Steve Gordon, Steve Gordon                      MRN:          [id number]  DATE:05/10/2006                            DOB:          [birth year]    [name] is in for follow up. He is stable and has not been having any  chest pain or shortness of breath. He feels well. His ankle is not healed  all that well.  His last lipid profile a year ago revealed an LDL of 139  which is well above target with a total cholesterol of 198 but his liver  functions were normal. He does not tolerate Statin's.   CURRENT MEDICATIONS:  1. Atenolol 50 mg daily.  2. Zetia 10 mg daily.  3. Aspirin 325 mg daily.  4. Norvasc 10 daily.   PHYSICAL EXAMINATION:  VITAL SIGNS:  Blood pressure is 129/82, pulse is 52.  NECK:  No carotid bruits.  LUNGS:  Lung fields clear.  CARDIOVASCULAR:  PMI non displaced, normal first and second heart sounds, no  murmurs, rubs, or gallops.  ABDOMEN:  Soft.  EXTREMITIES:  No edema.   EKG reveals normal sinus rhythm, within normal limits.   IMPRESSION:  1. Coronary artery disease status post coronary artery bypass graft      surgery.  2. Hyperlipidemia with absolute intolerance to Statin's.  3. Moderate elevation in LDL on Zetia alone.   PLAN:  Return to clinic in 1 year.  1. Continue current medical regimen.  2. Recheck lipid and liver profile.       Arturo Morton. Riley Kill, MD, [facility]     TDS/MedQ  DD:  05/10/2006  DT:  05/11/2006  Job #:  [id number]

## 2010-12-25 NOTE — Discharge Summary (Signed)
NAME:  Steve Gordon, Steve Gordon                         ACCOUNT NO.:  [id number]   MEDICAL RECORD NO.:  [id number]                   PATIENT TYPE:  INP   LOCATION:  2036                                 FACILITY:  [facility]   PHYSICIAN:  Salvatore Decent. Cornelius Moras, M.D.              DATE OF BIRTH:  Apr 16, 1936   DATE OF ADMISSION:  04/05/2002  DATE OF DISCHARGE:  04/10/2002                                 DISCHARGE SUMMARY   ADMISSION DIAGNOSIS:  Coronary artery disease.   SECONDARY DIAGNOSES:  1. Postoperative anemia secondary to blood loss.  2. Hypertension.   DISCHARGE DIAGNOSIS:  Coronary artery disease.   HOSPITAL COURSE:  The patient was admitted to [facility] on April 05, 2002, in order to undergo cardiac catheterization  secondary to exertional fatigue and known history of coronary artery  disease.  This catheterization revealed that he had severe coronary artery  disease amenable to surgical intervention.  Because of this, Salvatore Decent.  Cornelius Moras, M.D., was consulted.  On April 06, 2002, Dr. Cornelius Moras performed a  coronary artery bypass graft x 4 with the left internal mammary artery  anastomosed to the left anterior descending artery, saphenous vein graft to  the first circumflex marginal artery, saphenous vein graft to the posterior  descending artery, and saphenous vein graft to the diagonal artery.  No  complications were noted during the procedure.  Postoperatively, the patient  had a relatively uneventful postoperative course.  He had postoperative  anemia secondary to blood loss, which he tolerated well.  The hemoglobin and  hematocrit stabilized at 8.7 and 25.5, respectively.  The BUN and creatinine  were 24 and 1.7, respectively.  He was deemed stable for discharge on  April 10, 2002.   DISCHARGE MEDICATIONS:  1. Aspirin 325 mg one daily.  2. Zetia 10 mg one daily.  3. Atenolol 50 mg one daily.  4. Lasix 40 mg one daily for seven days.  5. Potassium chloride 20 mEq  one daily for seven days.  6. Ultram 50 mg one to two tablets every four to six hours as needed for     pain.   ACTIVITY:  The patient was told no driving or strenuous activity until see  in the office.  He was told to walk daily and continue breathing exercises.   DIET:  Low fat, low salt.   WOUND CARE:  The patient was told he could shower and clean his incisions  with soap and water.    FOLLOW-UP:  The patient was told to call his cardiologist from [facility] for a two-week appointment.  He was told to see Salvatore Decent. Cornelius Moras,  M.D., in three weeks.  The office will call him to verify the time and date  of this appointment.  He was told to bring chest x-rays with him to this  appointment.     Levin Erp. Steward, P.A.  Salvatore Decent. Cornelius Moras, M.D.    Henrene Hawking  D:  04/09/2002  T:  04/10/2002  Job:  [id number]   cc:   CVTS Office   [facility]

## 2010-12-25 NOTE — Op Note (Signed)
   NAME:  Steve Gordon, Steve Gordon                         ACCOUNT NO.:  [id number]   MEDICAL RECORD NO.:  [id number]                   PATIENT TYPE:  INP   LOCATION:  2306                                 FACILITY:  [facility]   PHYSICIAN:  Salvatore Decent. Cornelius Moras, M.D.              DATE OF BIRTH:  13-Dec-1935   DATE OF PROCEDURE:  04/06/2002  DATE OF DISCHARGE:                                 OPERATIVE REPORT   PREOPERATIVE DIAGNOSIS:  Severe three-vessel coronary artery disease with  progressive exertional angina.   POSTOPERATIVE DIAGNOSIS:  Severe three-vessel coronary artery disease with  progressive exertional angina.   OPERATION:  Median sternotomy for coronary artery bypass grafting x4 (left  internal mammary artery to distal left anterior descending coronary artery,  saphenous vein graft to the first diagonal branch, saphenous vein graft to  first circumflex marginal branch, saphenous vein graft to posterior  descending coronary artery).   SURGEON:  Salvatore Decent. Cornelius Moras, M.D.   FIRST ASSISTANT:  Jody P. Diamond Nickel.   ANESTHESIA:  General.   BRIEF CLINICAL NOTE:  The patient is a 75 year old white male followed by  Dr. Marga Melnick and Dr. Bonnee Quin, with a history of coronary artery  disease, hypertension, and hyperlipidemia. The patient presents with  progressive symptoms consistent with exertional angina.  Cardiac  catheterization performed by Dr. Riley Kill demonstrates severe three-vessel  coronary artery disease with preserved left ventricular function. A full  consultation note has been dictated previously.  Alternative treatment  strategies have been discussed. The patient and his wife understand and  accept all associated risks of surgery including but not limited to risks of  death, stroke, myocardial infarction, respiratory failure, bleeding  requiring blood transfusion, arrhythmia, infection, and recurrent coronary  artery disease. All of their questions have been addressed.   OPERATIVE NOTE IN DETAIL:  Dictation ends at this point.                                               Salvatore Decent. Cornelius Moras, M.D.    CHO/MEDQ  D:  04/06/2002  T:  04/10/2002  Job:  [id number]

## 2010-12-25 NOTE — Assessment & Plan Note (Signed)
[facility]                            CARDIOLOGY OFFICE NOTE   Steve Gordon, Steve Gordon                      MRN:          [id number]  DATE:10/11/2008                            DOB:          [birth year]    Steve Gordon is in for followup.  He had had a really bad episode of chest  pain while being seen at [facility] for kidney stones.  It is not really recurred since.  The patient had an elevated creatinine  at that time, but is being followed.  He now feels pretty much better.  He saw Dr. Daleen Squibb and pretty promptly in the office shortly after that.  His nitroglycerin was renewed, but he has not used any.   CURRENT MEDICATIONS:  1. Atenolol 50 mg daily.  2. Plavix 75 mg daily.  3. Norvasc 10 mg daily.  4. Aspirin 81 mg daily.   PHYSICAL EXAMINATION:  He is alert and oriented in no distress.  Blood  pressure 130/80, pulse 54.  Lung fields are clear.  Cardiac rhythm is  regular.  Extremities reveal no edema.   Electrocardiogram done today reveals normal sinus rhythm/sinus  bradycardia with a rate of 54.  There are no acute EKG changes.   IMPRESSION:  1. Known extensive coronary artery disease.  Last catheterization      October 05, 2006, at that time, the patient had an occluded vein      graft to the posterior descending coronary artery and circumflex as      well as an atretic left internal mammary artery, has undergone      stenting of the right coronary artery with a 4-0 x 16 Liberte stent      without recurrent symptoms.  2. Hypercholesterolemia with STATIN intolerance.  3. Hypertension, controlled.   PLAN:  1. Return to clinic in 6 months.  2. Continue current medical regimen.   ADDENDUM:  Recent lipid profile did reveal a cholesterol 195, LDL of  134.  We have had a thorough discussion about Zetia.  His triglycerides  are not significantly elevated.  His HDL is borderline.  Increased  activity, loss of weight, and careful  diet are all recommended.     Arturo Morton. Riley Kill, MD, [facility]  Electronically Signed    TDS/MedQ  DD: 10/20/2008  DT: 10/21/2008  Job #: [id number]

## 2010-12-25 NOTE — H&P (Signed)
NAME:  Steve Gordon, Steve Gordon NO.:  [id number]   MEDICAL RECORD NO.:  [id number]          PATIENT TYPE:  EMS   LOCATION:  MAJO                         FACILITY:  [facility]   PHYSICIAN:  [place] Bing, M.D.  DATE OF BIRTH:  [birth year]   DATE OF ADMISSION:  01/09/2005  DATE OF DISCHARGE:                                HISTORY & PHYSICAL   REFERRING PHYSICIAN:  Lorre Nick, M.D.   PRIMARY CARE PHYSICIAN:  Titus Dubin. Alwyn Ren, M.D.   PRIMARY CARDIOLOGIST:  Arturo Morton. Riley Kill, M.D.   HISTORY OF PRESENT ILLNESS:  This 75 year old gentleman, now with 75  years following CABG surgery, returns with chest discomfort. Steve Gordon  underwent uncomplicated surgery in August of 2003 and has done generally  well since. Activity is limited due to orthopedic problems, particularly of  his ankles. He generally has no dyspnea nor chest discomfort. On the morning  of admission, he developed anterior chest tightness at rest, which  progressed to epigastric discomfort associated with nausea. There was no  dyspnea nor diaphoresis. Blood pressure was taken at home and was known to  be very markedly elevated for him with systolic of 200 and a diastolic of  110. The chest discomfort became severe and prompted him to take sublingual  nitroglycerin, which did not to result in substantial improvement. EMS was  summoned and administered additional nitroglycerin and aspirin with relief  of his discomfort. Total duration of symptoms was 45 minutes. He describes  these as similar to his myocardial infarction in 1998.   Steve Gordon has multiple cardiovascular risk factors, including hypertension  and hyperlipidemia.   PAST MEDICAL HISTORY:  Notable for a VATS procedure for excision of a  peritracheal cyst.   ALLERGIES:  The patient has no true drug allergies, but has been intolerant  to STATINS.   MEDICATIONS:  Recent medications have included:  1.  Ezetimibe 10 mg daily.  2.  Amlodipine 5  daily.  3.  Atenolol 50 mg daily.  4.  Aspirin 325 mg daily.  5.  Nitroglycerin p.r.n., which he has not used for months or years.   SOCIAL HISTORY:  [place] his wife of 19  years. Retired from work as a [employer] fundraiser. Remote history of tobacco  smoking. Modest use of alcohol.   FAMILY HISTORY:  Mother and father died in their 14s; no known  cardiovascular disease.   REVIEW OF SYSTEMS:  Notable for the need for corrective lenses, chronic  dyspnea on moderate exertion, sleep apnea, arthralgias, particularly of his  ankles, and nocturia. All other systems reviewed and are negative.   PHYSICAL EXAMINATION:  GENERAL APPEARANCE:  A pleasant, well-appearing  gentleman.  VITAL SIGNS:  The heart rate is 55 and regular, respirations 16, blood  pressure 125/72 and O2 saturation 100% on 2 L. Afebrile. HEENT: Anicteric  sclerae. Normal lids and conjunctivae.  NECK:  No jugular venous distension. Normal carotid upstrokes without  bruits.  LUNGS:  Clear.  CARDIAC: Normal first and second heart sounds. Fourth heart sound present.  ABDOMEN:  Soft and nontender. No  organomegaly. No bruits.  EXTREMITIES: Distal pulses intact. No edema.  NEUROMUSCULAR: Normal cranial nerves. Normal strength and tone.  PSYCHIATRIC: Normal orientation. Normal affect.   LABORATORY DATA:  EKG: Sinus bradycardia; otherwise normal. Other laboratory  notable for a normal CBC, normal chemistry profile, normal coagulation  studies and normal cardiac markers.   IMPRESSION:  Steve Gordon presents with somewhat recent symptoms after a long,  symptom-free interval following CABG surgery. Three years postoperative  would be an unusual time for graft occlusion or progression of disease. EKG  was unremarkable following relief of discomfort. Examination is also  essentially benign.   Steve Gordon will be admitted for a serial cardiac markers and EKGs and  further assessment of symptoms. Use of  heparin and nitroglycerin will be  deferred for now. There certainly is a possibility of a GI origin for his  symptoms. A hepatic profile, amylase and lipase will be obtained. Further  ischemic for the evaluation is certainly warranted. Either a stress test or  coronary angiography would be reasonable. This decision will be deferred  until Dr. Riley Kill has a chance to reassess Steve Gordon.       RR/MEDQ  D:  01/09/2005  T:  01/10/2005  Job:  [id number]

## 2010-12-25 NOTE — Op Note (Signed)
NAME:  HUEL, CENTOLA                         ACCOUNT NO.:  [id number]   MEDICAL RECORD NO.:  [id number]                   PATIENT TYPE:  INP   LOCATION:  2020                                 FACILITY:  [facility]   PHYSICIAN:  Salvatore Decent. Cornelius Moras, M.D.              DATE OF BIRTH:  Jun 23, 1936   DATE OF PROCEDURE:  11/29/2002  DATE OF DISCHARGE:                                 OPERATIVE REPORT   PREOPERATIVE DIAGNOSIS:  Ventral incisional epigastric hernia.   POSTOPERATIVE DIAGNOSIS:  Ventral incisional epigastric hernia.   OPERATION PERFORMED:  Repair of epigastric hernia with mesh.   SURGEON:  Salvatore Decent. Cornelius Moras, M.D.   ASSISTANT:  Toribio Harbour, R.N.   ANESTHESIA:  General.   INDICATIONS FOR PROCEDURE:  The patient is a 75 year old gentleman who  underwent coronary artery bypass grafting in the past.  In December of 2003  he suffered a severe fall landing on his chest.  After this, he developed  bulging in the upper abdomen and he presented in January and was found to  have ventral incisional epigastric hernia.  This has been followed but  continues to bother him and remains symptomatic.  He now presents for  elective repair.  He has been counseled at length regarding the indications,  risks, potential benefits of surgery.  All of his questions have been  addressed.   DESCRIPTION OF PROCEDURE:  The patient was brought to the operating room on  the above mentioned date and placed in the supine position on the operating  table.  Intravenous antibiotics were administered.  Following induction of  general endotracheal anesthesia, an orogastric tube was placed and put on  suction.  The patient's anterior chest was prepared and draped in a sterile  manner.   A longitudinal incision was made beginning overlying the lower portion of  the previous median sternotomy incision just above the level of the xiphoid  process.  This incision was completed inferiorly approximately 6 cm in  length. The incision was completed through the subcutaneous tissue with  electrocautery.  The xiphoid process was identified and notably the left  portion of the xiphoid process was very prominent.  Fascial edges on either  side of  hernia defect are identified and dissected from associated  structures.  in total, the fascial defect measures approximately 5 x 3 cm in  length and width.  It is an elliptical defect with fascia that is apparently  torn free in the midline.  Electrocautery was utilized to mobilize  subcutaneous flaps circumferentially around the defect and in order to  obtain a full 2 cm border of good fascia of the anterior rectus sheath  circumferentially extending up onto the costal margin and lower portion of  the body of the sternum superiorly.  After the anterior surface of the  fascia was mobilized, dissection was continued on the underside of the  defect beneath the rectus  abdominis musculature on either side and beneath  the transversalis fascia.  The anterior rectus sheath is good strong tissues  circumferentially where it has been mobilized.  The posterior rectus sheath  is diminutive.  Meticulous hemostasis was ascertained.   An elliptical patch of Prolene mesh was trimmed to an appropriate size.  Mesh repair is now performed using interrupted 0 Prolene sutures placed in a  horizontal mattress suture circumferentially around the fascial defect with  the mesh placed beneath the level of the fascia.  It was performed in a  straightforward manner without difficulty.  The mesh was located beneath the  fascia and beneath the rectus musculature and posterior sheath.  After the  repair was completed, the wound was irrigated with saline solution  containing vancomycin.  The wound was drained with a single 10 French  Jackson-Pratt drain, exited through a separate stab incision.  The  subcutaneous tissues and dermis were reapproximated with interrupted 2-0  Vicryl sutures.   The skin was closed with a subcuticular skin closure.  An  abdominal binder was placed after placement of a sterile dressing.   The patient tolerated the procedure well, was extubated in the operating  room and transported to the recovery room in stable condition.  There were  no intraoperative complications.  All sponge, needle and instrument counts  were verified correct at the completion of the operation.  The estimated  blood loss for this procedure was trivial.                                               Salvatore Decent. Cornelius Moras, M.D.    CHO/MEDQ  D:  11/29/2002  T:  11/29/2002  Job:  [id number]

## 2010-12-25 NOTE — Consult Note (Signed)
NAME:  Steve Gordon, Steve Gordon NO.:  [id number]   MEDICAL RECORD NO.:  [id number]          PATIENT TYPE:  INP   LOCATION:  3709                         FACILITY:  [facility]   PHYSICIAN:  Guy Franco, P.A.       DATE OF BIRTH:  04-11-1936   DATE OF CONSULTATION:  01/14/2005  DATE OF DISCHARGE:                                   CONSULTATION   REASON FOR CONSULTATION:  Cholelithiasis.   Steve Gordon is a 75 year old male patient who was admitted four days ago with  substernal chest pain that migrated to the epigastric area.  This was  associated with nausea.  Cardiac enzymes were negative.  His LFTs were  mildly elevated and have transiently returned to normal.  His amylase and  lipase were normal.  Gallbladder ultrasound showed contracted gallbladder  with cholelithiasis, negative Murphy's sign.  An EGD performed by Dr. Arlyce Dice  today revealed a stricture and the patient underwent dilatation.  From a  cardiac standpoint, the patient also underwent a Cardiolite and this was  felt to be essentially normal with scar present.  We are consulted for  possible cholecystectomy tomorrow morning.   ALLERGIES:  Retavase, ACE inhibitor, calcium channel blocker.  Intolerance  to statins.   MEDICATIONS:  Norvasc 10 mg daily, aspirin 325 mg daily, Tenormin 50 mg  daily, Zetia 10 mg daily, and Protonix 40 mg daily.   PAST MEDICAL HISTORY:  Coronary artery disease, hypertension,  hyperlipidemia.  He did have a peritracheal cyst and underwent a VATS to  remove this lesion.   PAST SURGICAL HISTORY:  CABG in 2003, left ankle surgery, right inguinal  hernia repair, lipoma removal, ventral incisional epigastric hernia repair.   SOCIAL HISTORY:  The patient lives in [place], retired [employer] architect,  married, former tobacco abuse, some alcohol, no illicit drug use.   FAMILY HISTORY:  Both parents died in their 9s from unknown causes.   REVIEW OF SYMPTOMS:  Chest pain and abdominal pain.  All  other systems are  negative.   PHYSICAL EXAMINATION:  VITAL SIGNS:  Temperature 97.6, pulse 83, respirations 18, blood pressure  122/70, O2 saturation 94% on room air.  GENERAL:  In no acute distress.  HEENT:  Grossly normal.  Sclerae clear.  Conjunctivae normal.  Nares without  discharge.  HEART:  Regular rate and rhythm, no murmurs, gallops, and rubs.  LUNGS:  Clear to auscultation bilaterally, no wheezing, no rhonchi.  SKIN:  Warm and dry.  ABDOMEN:  Soft, nondistended, he is essentially nontender with no rebound or  guarding.  EXTREMITIES:  No cyanosis, clubbing, and edema.  MUSCULOSKELETAL:  No joint deformity, no CVA tenderness.  NEUROLOGICAL:  Cranial nerves 2-12 grossly intact, alert and oriented x 3.   LABORATORY DATA:  Chest x-ray shows cardiomegaly with no active disease.  Ultrasound shows cholelithiasis, negative for Murphy's sign with a  contracted gallbladder.  Cardiac enzymes x 3 negative.  Total cholesterol  165, triglycerides 98, HDL 36, LDL 109, BMP normal,  white count 8.1.  Initial AST 111, now down to 21, ALT initially 60, now down  to 34, alkaline  phos 67, amylase and lipase normal, T-bili 1.   ASSESSMENT AND PLAN:  1.  Cholelithiasis.  2.  Esophageal stricture status post dilatation.  3.  Coronary artery disease with history of CABG.  4.  Hypertension, treated.  5.  Hyperlipidemia, treated.  6.  History of peritracheal cyst status post VATS for removal.   Our plans are to proceed with laparoscopic cholecystectomy with  interoperative cholangiogram on January 15, 2005, under the care of Dr. Leonie Man.  The patient has been seen and evaluated by Dr. Leonie Man.       LB/MEDQ  D:  01/14/2005  T:  01/14/2005  Job:  [id number]   cc:   Barbette Hair. Arlyce Dice, M.D. [facility]   Maisie Fus D. Riley Kill, M.D. [facility]   Titus Dubin. Alwyn Ren, M.D. [facility]

## 2010-12-25 NOTE — Op Note (Signed)
NAME:  Steve Gordon, Steve Gordon NO.:  [id number]   MEDICAL RECORD NO.:  [id number]          PATIENT TYPE:  INP   LOCATION:  3709                         FACILITY:  [facility]   PHYSICIAN:  Leonie Man, M.D.   DATE OF BIRTH:  1936/01/26   DATE OF PROCEDURE:  01/14/2005  DATE OF DISCHARGE:                                 OPERATIVE REPORT   PREOPERATIVE DIAGNOSIS:  Chronic calculous cholecystitis.   POSTOPERATIVE DIAGNOSIS:  Chronic calculous cholecystitis.   PROCEDURES:  Laparoscopic cholecystectomy and intraoperative cholangiogram.   SURGEON:  Leonie Man, M.D.   ASSISTANT:  Magnus Ivan, RNFA.   ANESTHESIA:  Is general.   SPECIMENS TO PATHOLOGY:  Gallbladder and stones.   ESTIMATED BLOOD LOSS:  Was minimal.   COMPLICATIONS:  None apparent. The patient to return to the PACU in good  condition.   NOTE:  Steve Gordon is a 75 year old gentleman admitted with chest pain and  diaphoresis. The cardiac workup has been negative. He comes to the operating  room after his liver function studies which were noted to be elevated, have  returned to normal. The patient shows on previous ultrasound cholelithiasis.   The risks and potential benefits of surgery have been done discussed with  the patient. He understands and gives consent to surgery.   PROCEDURE:  Following induction of satisfactory general anesthesia with the  patient positioned supinely. The abdomen routinely prepped and draped to be  included in a sterile operative field. Open laparoscopy created at the  umbilicus with insertion of a Hassan cannula, insufflation of peritoneal  cavity to 14 mmHg pressure using carbon dioxide. Camera was inserted. Visual  exploration of the abdomen carried out. Multiple adhesions to the  gallbladder wall were noted and gallbladder was packed with stones. Liver  edges were sharp, liver surfaces smooth. The anterior gastric wall and  duodenal sweep appeared to be normal. None of  the large or small intestine  viewed appeared to be abnormal.   Under direct vision, epigastric and lateral ports placed. The gallbladder  was grasped and adhesions gallbladder wall taken down, carrying the  dissection down to the ampulla of the gallbladder. In the region of the  ampulla the cystic artery and cystic duct were dissected free tracing the  cystic artery up to its entry into the gallbladder wall and cystic duct to  the gallbladder cystic duct junction. The cystic duct was clamped proximally  and opened and a cystic duct cholangiogram was carried out by passing a  Reddick catheter into the abdomen and inserting into the cystic duct.  Through this one half-strength Hypaque dye was injected under fluoroscopic  guidance with the resulting cholangiogram showing a flow of contrast into  the duodenum, normal tapering of the distal common bile duct, no filling  defects in the common hepatic, right to left hepatic, or the common bile  duct. The cystic duct catheter was removed and the cystic duct was clipped  triply clipped and transected. The cystic artery was then doubly clipped and  transected and the gallbladder then dissected free from the liver bed using  electrocautery  and maintaining hemostasis throughout the course of  dissection. The gallbladder was chronically scarred and dissection was  moderately difficult. However, no breeches were made in the gallbladder  during the course of dissection. Hemostasis was noted to be intact at end of  the dissection. Right upper quadrant then thoroughly irrigated with multiple  aliquots of normal saline and aspirated. The gallbladder was then retrieved  through the umbilical port with a camera placed in the epigastric port. The  lateral trocars removed under direct vision. Sponge, instrument and sharp  counts were verified and the wounds closed in layers as follows. The  umbilical wound closed in two layers with a interrupted 0 Vicryl  sutures  followed by 4-0 Monocryl sutures, epigastric and lateral flank wounds closed  with 4-0 Monocryl sutures. All wounds reinforced with Steri-Strips. Sterile  dressings applied. Anesthetic reversed. The patient removed from the  operating room to the recovery room in stable condition. He tolerated the  procedure well.       PB/MEDQ  D:  01/14/2005  T:  01/14/2005  Job:  [id number]   cc:   Barbette Hair. Arlyce Dice, M.D. [facility]

## 2010-12-25 NOTE — Assessment & Plan Note (Signed)
[facility]                            CARDIOLOGY OFFICE NOTE   Steve Gordon, Steve Gordon                      MRN:          [id number]  DATE:11/21/2006                            DOB:          1936-06-13    Steve Gordon is in today for follow up.  In general, he is stabilized.  He  denies any ongoing chest pain.  The patient has had prior treatment for  an acute anterior wall infarction followed by CABG in 2003.  He recently  had some chest pain, as well as some shortness of breath and was  hospitalized.  He underwent catheterization by Dr. Excell Seltzer.  He had a  patent saphenous vein graft to the first diagonal and occlusion of the  vein grafts to the PDA and circumflex system, as well as atresia to the  left internal mammary graft.  PCI of the right coronary artery was  performed with a bare metal stent, and it was felt that he could be  managed medically thereafter.  He currently is doing reasonably well.   MEDICATIONS:  1. Atenolol 50 mg daily.  2. Zetia 10 mg daily.  3. Norvasc 10 mg daily.  4. Plavix 75 mg daily.   PHYSICAL EXAMINATION:  GENERAL:  He is an alert and oriented gentleman  in no acute distress.  VITAL SIGNS:  The blood pressure is 110/70, pulse 58.  LUNGS:  The lung fields are clear.  CARDIAC:  Regular.   [name] continues to do clinically reasonably well.  He unfortunately  has had significant statin intolerance in the past, and on Zetia alone  the lowest LDL we have really is in the 130 range.  We are continuing  his current medical regimen, and I will see him back in follow up in the  near future.  Should he have any problems in the interim, he is to  contact me.     Arturo Morton. Riley Kill, MD, [facility]  Electronically Signed    TDS/MedQ  DD: 11/29/2006  DT: 11/29/2006  Job #: [id number]

## 2010-12-25 NOTE — Cardiovascular Report (Signed)
NAME:  Steve Gordon, FUSSELL NO.:  [id number]   MEDICAL RECORD NO.:  [id number]                   PATIENT TYPE:  INP   LOCATION:  2036                                 FACILITY:  [facility]   PHYSICIAN:  Arturo Morton. Riley Kill, M.D. [facility]         DATE OF BIRTH:  12-20-1935   DATE OF PROCEDURE:  DATE OF DISCHARGE:                              CARDIAC CATHETERIZATION   INDICATIONS:  The patient is well known to me.  He is a 75 year old  gentleman who has had a prior percutaneous intervention following an MI in  the anterior distribution. At that time we dilated both the LAD, which was  stented, and the diagonal through the stent. He has continued to do well but  recently has developed exertional fatigue and shortness of breath.  The  current study was done to redefine coronary anatomy.   PROCEDURES:  1. Left heart catheterization.  2. Selective coronary arteriography.  3. Selective left ventriculography.   DESCRIPTION OF PROCEDURE:  The procedure was performed from the right  femoral artery using 6 French catheters.  He tolerated the procedure without  complication.  He was taken to the holding area in satisfactory clinical  condition.   HEMODYNAMIC DATA:  1. Central aorta 147/81.  2. Left ventricle 145/23.  3. No aortic left ventricular gradient on pullback across the aortic valve.   ANGIOGRAPHIC DATA:  1. Ventriculography was performed in the RAO projection. The anterior wall     was recovered.  Overall, systolic function is preserved. Ejection     fraction is 66.7%.  2. The left main coronary artery was free of critical disease. There is mild     luminal irregularity.  3. The left anterior descending artery demonstrates a fair amount of luminal     irregularity throughout the proximal and mid course. At the area of the     stent, there is no significant re-stenosis with a fair amount of 30%     diffuse in-stent re-narrowing, but this is not high-grade. The  diagonal     was a large caliber vessel that had about 80% proximal narrowing and then     about 70% mid narrowing. The distal vessel appears to be suitable for     grafting.  4. The circumflex demonstrates 30-40% proximal narrowing followed by a 95%     stenosis involving the large OM-2. The AV circumflex provides a third OM     which is not critically diseased.  5. The right coronary artery has some diffuse luminal irregularity but no     high-grade stenosis, throughout  the proximal, mid and distal portions.     The proximal posterior descending artery has about a 75% area of     narrowing at the takeoff of a small side branch. The posterolateral     system is without critical disease.   CONCLUSION:  1. Normal left ventricular function.  2. No  evidence of in-stent re-stenosis at the previous site of stenting in     the left anterior descending.  3. Moderately high-grade involvement of the diagonal with critical     involvement of the circumflex obtuse marginal #2, and significant     involvement of the proximal posterior descending branch.    DISPOSITION:  I plan to review the films with my colleagues. Final decision  will be depending on that review as well as discussion with the patient.                                                 Arturo Morton. Riley Kill, M.D. [facility]    TDS/MEDQ  D:  04/04/2002  T:  04/05/2002  Job:  [id number]   cc:   Titus Dubin. Alwyn Ren, M.D. [facility]   CV Laboratory

## 2010-12-25 NOTE — Consult Note (Signed)
NAME:  Steve Gordon, Steve Gordon                         ACCOUNT NO.:  [id number]   MEDICAL RECORD NO.:  [id number]                   PATIENT TYPE:  INP   LOCATION:  2036                                 FACILITY:  [facility]   PHYSICIAN:  Salvatore Decent. Cornelius Moras, M.D.              DATE OF BIRTH:  Jan 22, 1936   DATE OF CONSULTATION:  04/05/2002  DATE OF DISCHARGE:                                   CONSULTATION   PRIMARY CARE PHYSICIAN:  Titus Dubin. Alwyn Ren, M.D.   REASON FOR CONSULTATION:  Severe three-vessel coronary artery disease with  exertional angina.   HISTORY OF PRESENT ILLNESS:  The patient is a 75 year old gentleman with a  history of coronary artery disease dating back to 1998, at which time he  suffered an acute anterior wall myocardial infarction.  He underwent  angioplasty and stenting of the proximal left anterior descending coronary  artery at that time, including a bifurcation lesion involving the first  diagonal branch.  He has done well medically since then with no significant  recurrent cardiac event.  He has been followed carefully by Arturo Morton.  Riley Kill, M.D., and recently returned with symptoms of worsening exertional  fatigue, as well as atypical chest pain.  The patient now describes  exertional chest tightness which radiates across his left chest, as well as  to the right chest and down his right arm.  He has had a one-year history of  progressive exertional shortness of breath and worsening fatigue.  He was  seen recently in the office by Arturo Morton. Riley Kill, M.D., and elective cardiac  catheterization was felt clinically indicated.  He was brought in for this  test yesterday afternoon.  Catheterization demonstrates severe three-vessel  coronary artery disease with preserved left ventricular function.  A cardiac  surgical consultation is requested for possible surgical revascularization.   REVIEW OF SYSTEMS:  GENERAL:  The patient reports worsening exertional  fatigue and  tiredness.  He reports no change in appetite and states that his  weight has been stable for some time now.  RESPIRATORY:  Notable for  exertional shortness of breath.  The patient denies any history of  productive cough, hemoptysis, or wheezing.  CARDIAC:  Notable for chest  discomfort as previously reported.  The patient denies any symptoms of  resting chest pain or shortness of breath and his symptoms are only brought  on with activity.  He denies any history of orthopnea, PND, or lower  extremity edema.  He has had occasional palpitations for years.  He denies  any history of syncope or near syncopal episodes.  He does report that he  gets lightheaded at the waist.  GASTROINTESTINAL:  Negative.  The patient  denies problems with dysphagia, odynophagia, hematochezia, hematemesis,  melena, constipation, or diarrhea.  NEUROLOGIC:  Negative.  The patient  specifically denies symptoms of transient monocular blindness or transient  numbness or weakness involving either the  upper or lower extremities.  MUSCULOSKELETAL:  Notable for chronic pain in his left ankle related to an  old fracture.  He has had some problems with his right hip with arthritis  related to hip dislocation in the distant past.  The patient still has some  mild pain radiating around his right chest consistent with post thoracotomy  pain related to previous surgical procedure.  GENITOURINARY:  Negative.  INFECTIOUS:  Negative.  HEMATOLOGIC:  Negative.  The patient specifically  denies any bleeding diaphysis, easy bruising, or frequent episodes with  epistaxis.  ENDOCRINE:  Negative.  PSYCHIATRIC:  Negative.  PERIPHERAL  VASCULAR:  Negative.  HEENT:  Negative.  The patient reports stable  eyesight.  He wears glasses for reading.  He has partial plates for upper  dentures.  He denies any loose teeth or other problems with his mouth.   PAST MEDICAL HISTORY:  Notable for a history of coronary artery disease as  previously  outlined.  The patient also has a history of hypertension and  hyperlipidemia.  The patient denies any known previous history of diabetes  mellitus, stroke, or peripheral vascular disease.  The patient underwent  right VATS for resection of a bronchogenic cyst in December of 1998.  The  patient also underwent open reduction and internal fixation of his left  ankle in the remote past.  The patient has had hernia repairs in the past.   FAMILY HISTORY:  Notable for the absence of early onset coronary artery  disease.  His father did develop coronary artery disease, but he was in his  30s at the time.   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  He lives with his wife here in [place],  [place].  He enjoys fixing cars and working in his shop.  He does not  live a particularly strenuous lifestyle.  He is a nonsmoker, although he  does have a remote history of tobacco use more than 40 years ago.  He denies  any history of significant alcohol consumption.   MEDICATIONS PRIOR TO ADMISSION:  1. Aspirin 325 mg p.o. once daily.  2. Vetia 10 mg p.o. once daily.  3. Norvasc 5 mg p.o. once daily.  4. Atenolol 50 mg p.o. once daily.  5. Aleve two to three tablets as necessary daily for chronic pain.   ALLERGIES:  The patient reports drug sensitivity to ACE INHIBITORS which  have caused deterioration of his renal function in the past, as well as  myalgias as a complication from the use of LESCOL.  The patient reports  sensitivity to RETAVASE.   PHYSICAL EXAMINATION:  GENERAL APPEARANCE:  Notable for a well-appearing,  mildly obese, white male who appears his stated and in no acute distress.  VITAL SIGNS:  He is currently afebrile and normotensive.  HEENT:  Exam is essentially within normal limits.  NECK:  Supple. LYMPHATICS:  There is no cervical or supraclavicular lymphadenopathy.  NECK:  There is no jugular venous  distention.  No carotid bruits are noted.  CHEST:  Auscultation reveals clear and symmetrical breath sounds  bilaterally.  No wheezes or rhonchi are demonstrated.  Breath sounds are  symmetrical.  CARDIOVASCULAR:  Regular rate and rhythm.  No murmurs, rubs, or gallops are  noted.  ABDOMEN:  Moderately obese, soft, and nontender.  There are no palpable  masses.  The liver edge is not enlarged.  Bowel sounds are present.  EXTREMITIES:  Warm and well perfused.  There is no lower extremity edema.  There is no sign of venous insufficiency.  Distal pulses are palpable in  both lower legs at the ankle.  RECTAL:  Deferred.  GENITOURINARY:  Deferred.  NEUROLOGIC:  Grossly nonfocal and symmetrical.   The remainder of his physical exam is unrevealing.   DIAGNOSTIC TESTS:  The cardiac catheterization performed today by Arturo Morton.  Riley Kill, M.D., is reviewed.  This demonstrates severe three-vessel coronary  artery disease with normal left ventricular function.  Specifically, there  is 70% stenosis of the proximal left anterior descending coronary artery  rising within the distal end of his previous stent.  There is 80% proximal  stenosis of a large first diagonal branch with 70% stenosis of the same  vessel further down.  The distal left anterior descending coronary artery  appears to be relatively small caliber and may or may not be a very good  target for grafting.  The left circumflex coronary artery has luminal  irregularities with 30% proximal stenosis.  There is 95% ostial stenosis of  a very large first circumflex marginal branch.  There is right dominant  coronary circulation.  There is insignificant disease in the proximal right  coronary artery.  However, there is 75-80% ostial stenosis of the posterior  descending coronary artery.   IMPRESSION:  Severe three-vessel coronary artery disease with normal left  ventricular function and progressive symptoms of exertional angina.  I  believe  that the patient would best be treated with surgical  revascularization.   PLAN:  I have outlined at length the indication and potential benefits of  coronary artery bypass grafting with the patient and his wife.  Alternative  treatment strategy had been discussed.  They understand and accept all  associated risks of surgery, including, but not limited to the risk of  death, stroke, myocardial infarction, bleeding requiring blood transfusion,  arrhythmia, infection, and recurrent coronary artery disease.  All of their  questions have been addressed.  We tentatively plan to proceed with surgery  for his case tomorrow.                                               Salvatore Decent. Cornelius Moras, M.D.    CHO/MEDQ  D:  04/05/2002  T:  04/08/2002  Job:  [id number]   cc:   Arturo Morton. Riley Kill, M.D. [facility]   Titus Dubin. Alwyn Ren, M.D. [facility]

## 2010-12-25 NOTE — H&P (Signed)
NAME:  Steve Gordon, Steve Gordon                         ACCOUNT NO.:  [id number]   MEDICAL RECORD NO.:  [id number]                   PATIENT TYPE:  INP   LOCATION:  NA                                   FACILITY:  [facility]   PHYSICIAN:  Salvatore Decent. Cornelius Moras, M.D.              DATE OF BIRTH:  Oct 30, 1935   DATE OF ADMISSION:  11/27/2002  DATE OF DISCHARGE:                                HISTORY & PHYSICAL   CHIEF COMPLAINT:  Epigastric hernia.   HISTORY OF PRESENT ILLNESS:  This is a 75 year old white male who had a CABG  x 4 on 04/06/2002 by Dr. Cornelius Moras.  His postop course was uneventful.  He slipped  on a wet floor at a restaurant in December 2003, falling on his chest.  He  subsequently developed discomfort and bulging in the upper abdomen just  below the sternal incision.  He also suffered a right wrist injury which  resolved.  His sternal incision remained intact. There was no problem with  that at all.  He saw Dr. Cornelius Moras in January 2004 and was diagnosed with  epigastric hernia.  The hernia was followed at the office for around three  months with the hope it would heal on its own.  It has, however, continued  to bother him, and during a recent visit on November 12, 2002, Dr. Cornelius Moras  recommended elective repair using mesh.  The surgery is scheduled for  11/27/2002.   Today the patient has no new complaints.  He continues to be annoyed with  this epigastric hernia which continues to pop in and out throughout the day.  There is mild pain associated with it.   REVIEW OF SYSTEMS:  Mild pain and bulging in the upper abdomen.  Contents of  hernia go in and out frequently.  He has had no episodes of strangulation.  He has had no GI problems, no bowel problems.  CARDIAC:  Negative for chest  pain, palpitations.  PULMONARY:  Negative for shortness of breath, cough, or  hemoptysis.  He does have seasonal allergy complaints.  NEUROLOGIC:  Negative for TIA symptoms.  There is no history of CVA.  GU:  Positive for  nocturia.  GENERAL:  Negative for fever, chills, or weight changes.   PAST MEDICAL HISTORY:  1. Epigastric hernia since December 2003.  2. Coronary artery disease.  3. MI in My 89.  4. Hypertension.  5. Bronchogenic cyst.  6. Hyperlipidemia.   PAST SURGICAL HISTORY:  1. CABG x 4 on 04/06/2002 by Dr. Cornelius Moras.  2. Right VATS resection of bronchogenic cyst in December 1998 by Dr. Cornelius Moras.  3. Right inguinal hernia repair x 3 by Dr. Wiliam Ke.  4. Open reduction, internal fixation of left ankle fracture.  5. Lipoma removal.   SOCIAL HISTORY:  Positive for remote tobacco use.  He drinks one to two  beers a week.  He lives in [place].  He  is married.  His wife is with him  here today.  She is very supportive.  He is a retired Naval architect.   FAMILY HISTORY:  Mother deceased from Alzheimer's, hypertension, and kidney  cancer, age 28.  Father deceased, age 49, with coronary artery disease.  One  brother deceased from coronary artery disease, hypertension, and GI bleed,  age 38.  Another brother is alive at 68 with coronary artery disease and  hypertension.  He has one daughter, age 6, with asthma; another daughter,  age 7, alive and well.   ALLERGIES:  No real drug allergies.  He reports sensitivity to ACE  INHIBITORS, LESCOL, and RETAVASE.   MEDICATIONS:  1. Norvasc 5 mg 1 p.o. daily.  2. Atenolol 50 mg 1 p.o. daily.  3. Zetia 10 mg p.o. daily.   PHYSICAL EXAMINATION:  VITAL SIGNS:  Blood pressure 140/90, pulse 66 and  regular, respirations 16.  GENERAL:  Well nourished, well developed white male, moderately obese,  cheerful, talkative, in no acute distress.  HEENT:  Normocephalic and atraumatic.  Pupils are equal, round, and reactive  to light.  Extraocular movements intact.  Visual acuity intact OU.  Oropharynx grossly normal.  Nares patent.  NECK:  Supple, 2+ carotids, no bruits, no adenopathy.  CHEST: Symmetrical, clear, and equal breath sounds anterior and posterior.  He has a  healed sternotomy incision.  CARDIAC:  Regular rate and rhythm.  S1, S2 with no murmur, rub, or gallop.  VASCULAR:  He has mild lower extremity edema, right greater than left.  He  has a healed right lower extremity venectomy site, and 2+ pedal pulses are  present.  NEUROLOGIC:  Grossly intact.  Cranial nerves are intact.  ABDOMEN:  There is a 3 x 3 cm easily palpable and reducible mass in the very  distal area of the sternal incision that does not appear tender on  palpation.  The abdomen is otherwise soft, moderately obese, nontender,  nondistended with bowel sounds present.  No organomegaly.  MUSCULOSKELETAL:  +5/5 strength throughout, full range of motion throughout.  SKIN:  He has a subcutaneous egg-size mass on his right shoulder which feels  like a lipoma.  He says it has been there for many years.   ASSESSMENT/PLAN:  This is a 75 year old white male with epigastric hernia  status post fall and previous coronary artery bypass grafting in August 2003  by Dr. Cornelius Moras.  The hernia has been bothersome, and Dr. Cornelius Moras has recommended  repair with mesh.  This is scheduled for 11/27/2002.     Lissa Merlin, P.A.                          Salvatore Decent. Cornelius Moras, M.D.    Alwyn Ren  D:  11/23/2002  T:  11/23/2002  Job:  [id number]   cc:   Arturo Morton. Riley Kill, M.D. [facility]   Titus Dubin. Alwyn Ren, M.D. [facility]

## 2010-12-25 NOTE — Op Note (Signed)
NAME:  Steve Gordon, Steve Gordon NO.:  [id number]   MEDICAL RECORD NO.:  [id number]          PATIENT TYPE:  AMB   LOCATION:  DSC                          FACILITY:  [facility]   PHYSICIAN:  Leonides Grills, M.D.     DATE OF BIRTH:  1936/03/07   DATE OF PROCEDURE:  07/06/2005  DATE OF DISCHARGE:                                 OPERATIVE REPORT   PREOPERATIVE DIAGNOSES:  1.  Left varus hindfoot malalignment.  2.  Left varus tibiofibular deformity.  3.  Left tibial spurs.  4.  Left fibular spurs.  5.  Left tight deep deltoid ligament.  6.  Anterior left ankle impingement.  7.  Left tight gastroc.   POSTOPERATIVE DIAGNOSES:   OPERATION PERFORMED:   SURGEON:  Leonides Grills, M.D.   ASSISTANT:  Lianne Cure, P. A.   ANESTHESIA:  General with block.   ESTIMATED BLOOD LOSS:  Blood loss was minimal.   TOURNIQUET TIME:  The tourniquet time was approximately two hours.   COMPLICATIONS:  None.   DISPOSITION:  Stable to the PAR.   INDICATIONS:  This is a 75 year old gentleman who had eccentric joint wear  on the medial aspect of his ankle with a varus tilt.  He has consented to  the above procedure.  All risks, which include infection, neurovascular  injury, nonunion, malunion, hardware irritation, hardware failure,  persistent pain, worse pain, prolonged recovery, stiffness, and arthritis  were all explained.  Questions were encouraged and answered.   DESCRIPTION OF THE OPERATION:  The patient was brought to the operating room  and was placed in the supine position.  After adequate general endotracheal  tube anesthesia was administered as well as Ancef 1 gram IV piggyback a bump  was then placed under the left hip.  All bony prominences were well-padded  and the left lower extremity was then prepped and draped in a sterile manner  over a proximally placed thigh tourniquet.   We started the procedure with a longitudinal incision over the medial aspect  of the gastrocnemius  muscle tendinous junction.  Dissection was carried down  through the skin and hemostasis was obtained.  The fascia was opened in line  with the incision.  A conjoint region was then developed between the gastroc  and soleus.  The soft tissue was then elevated off the posterior aspect of  the gastrocnemius.  Sural nerve was identified and protected posteriorly.  With curved Mayo scissors the gastrocnemius was then released.  This had an  excellent release of the tight gastroc.  The area was copiously irrigated  with normal saline. The subcu was closed with 3-0 Vicryl, skin was closed  with 4-0 Monocryl subcuticular stitch and Steri-strips were applied.   We then gravity exsanguinated the left lower extremity and the tourniquet  was elevated to 290 mmHg.  A longitudinal incision over the fibula was then  made.  Dissection was carried down through the skin.  Hemostasis was  obtained.  The soft tissue was elevated on the anterior and posterior  aspects of the tib-fib area.  Then under C-arm guidance two  20 K wires were  then placed and closely wedged manner retaining the medial cortex of the  tibia.  Then with the soft tissues protected both anteriorly and posteriorly  with retractors by the assistance a sagittal saw was then used to create the  osteotomy.  This was done very carefully and meticulously under C-arm  guidance.  Once this was done and the bone graft was placed on the back  table for later local grafting we then closed down the osteotomy, which  closed down beautifully and the correction was excellent.  We then removed  the K wires and applied a four-hole one-third tubular plate and fixed this  with four 4.5 mm fully threaded cortical set screws using 3.2 mm drill holes  respectively.  This had excellent purchase and compression across the  osteotomy site.  The bone graft was applied prior to osteotomy closure and  around the osteotomy site as well as stress and strain relieved on  the bone  graft after the area was copiously irrigated with normal saline.   We then made a longitudinal incision in the lateral aspect of the calcaneus.  Dissection was carried down to bone.  Soft tissues were then elevated both  superiorly and inferiorly, and protected throughout the case.  Using a  sagittal saw an osteotomy was then created and the calcaneus was then  translated laterally once a straight osteotome was applied followed by  laminar spreader to stretch the soft tissues.  Once this was lateralized  adequately, which was approximately 8-10 mm, this was then provisionally  fixed with a 20 K wire.  We then made a longitudinal incision in the heel  and two 6.5 mm, 16 mm partially threaded cancellous screws were then placed  using 4.5 and 3.2 mm drill holes respectively.  This had excellent purchase  and maintenance, and correction and the K wire was removed.   We then obtained stress x-rays in the AP and lateral planes of the ankle,  and mortise views, and these showed that there was no gross motion across  the osteotomy site.  Fixation was in proper position.  The correction was  excellent as well.  We then obtained a lateral and axial view of the  calcaneal osteotomy; and, showed fixation in the proper position,  translation was excellent and there was no gross motion across the osteotomy  site.   The areas were copiously irrigated with normal saline.   We then made a longitudinal incision over the anteromedial and anterolateral  aspects of the ankle, and a nick and spread technique was created to create  the anteromedial and lateral portals after a spinal needle was placed in the  ankle and 20 mL of normal saline was placed.  The anteromedial portal was  created medial to the anterior tibialis tendon and the lateral was created  lateral to the peroneus insertion.  The superficial peroneal nerve could not be seen.  We then with the nick and spread technique created the   anteromedial portal.  A blunt-tipped trocar with cannula __________  was  then placed in the ankle; and, under direct visualization the anterolateral  portal was created with the spinal needle followed by nick and spread  technique.  There was a tremendous amount of synovitis in the anterior  aspect of the ankle with actual loose body in the ankle as well.  The  incision anterolaterally was increased to remove the loose body as well as  the spurs.  Once  this was increased we then removed the loose body and then  with a curved quarter inch osteotome, synovectomy rongeur as well as a  shaver we removed the anterior fibular as well as tibial spurs.  Once this  was done an extensive debridement was then performed of the entire ankle.  This took quite some time using a bevel as well as a shaver.   Once this was done we then performed a deep deltoid ligament release using a  curved quarter inch osteotome extending the anteromedial incision distally  as well to perform this procedure.  We then ranged the ankle and under C-arm  guidance found that this closed down nicely laterally.  Then with maximum  dorsiflexion there was no anterior ankle impingement.  The pictures were  obtained through the scope as well.   Tourniquet was deflated.  Hemostasis was obtained.  Wounds were closed with  3-0 Vicryl followed by 4-0 nylon sutures.  Sterile dressings were applied.  Modified Jones dressing was applied.  There was a palpable dorsalis pedis,  posterior pulse at the end of the procedure as well and the compartments  were soft in both the leg and foot.   The patient was stable to the PAR.      Leonides Grills, M.D.  Electronically Signed     PB/MEDQ  D:  07/06/2005  T:  07/07/2005  Job:  [id number]

## 2010-12-25 NOTE — Assessment & Plan Note (Signed)
[facility]                            CARDIOLOGY OFFICE NOTE   [name], [name]                      MRN:          [id number]  DATE:10/13/2006                            DOB:          [birth year]    PRIMARY CARDIOLOGIST:  Dr. Shawnie Pons   HISTORY OF PRESENT ILLNESS:  [name] is a 75 year old male patient  followed by Dr. Riley Kill with a history of coronary artery disease,  status post CABG in 2003 who presents to the office today for post  hospitalization followup.  He presented to the office on February 26  with complaints of chest pain consistent with unstable angina pectoris.  He was admitted for further evaluation.  He ruled out for myocardial  infarction with the enzymes.  He went for cardiac catheterization by Dr.  Excell Seltzer on February 27.  This revealed diffuse 50% restenosis within the  proximal LAD stent, 80% stenosis in the proximal OM3 and 99% stenosis in  the mid to distal RCA. His LIMA to LAD was atretic.  His vein graft to  the obtuse marginal and vein graft to the PDA were both occluded.  The  vein graft to the diagonal was patent.  He was treated with a bare metal  stent to the RCA.  Dr. Excell Seltzer felt that his residual coronary disease  could be treated medically.  The patient presents to the office today  for follow up.  He denies any recurrent chest pain or shortness of  breath.  He does still feel somewhat tired.  Otherwise, he is doing  well.  He is somewhat limited in his exercise by his ankle that was  injured some time ago, but when he does get around he denies any chest  pain, shortness of breath.  Denies any syncope.   CURRENT MEDICATIONS:  1. Atenolol 50 mg daily.  2. Zetia 10 mg daily.  3. Aspirin 325 mg daily.  4. Norvasc 10 mg daily.  5. Plavix 75 mg daily.   ALLERGIES:  No known drug allergies.   PHYSICAL EXAMINATION:  He is a well-nourished, well-developed male.  His blood pressure is 135/70, pulse of 65,  weight 201 pounds.  HEENT is unremarkable.  Neck is benign.  CARDIAC:  Normal S1, S2.  Regular rate and rhythm.  LUNGS:  Are clear to auscultation bilaterally without wheeze, rales or  rhonchi.  ABDOMEN:  Soft, nontender.  EXTREMITIES:  No edema.  Calves are soft, nontender.  SKIN:  Is warm and dry.  NEUROLOGIC:  Alert and oriented x3.  Cranial nerves II through XII  grossly intact.  Right groin without hematoma or bruit.   Electrocardiogram reveals sounds from the heart rate of 63, non-specific  ST-T wave changes.   IMPRESSION:  1. Coronary artery disease.      a.     Status post anterior wall myocardial infarction, treated       with percutaneous coronary intervention in 1998.  b . Status post coronary artery bypass graft x4 in 2003.  c.  Status post bare metal stenting to the native right coronary artery  October 05, 2006.  d.  Two of four grafts patent with residual coronary artery disease as  noted above.  1. Preserved left ventricular function.  2. Hypertension.  3. Hyperlipidemia.      a.     Statin intolerance.  4. History of left ankle surgery.   PLAN:  The patient presents to the office today for post hospitalization  follow up.  He is doing well. He denies any recurrent chest pain or  shortness of breath.  The patient will be maintained on aspirin and  Plavix.  I will be in touch with Dr. Riley Kill regarding whether or not  the patient should continue Plavix beyond the 30 day time frame.  He  will follow up with Dr. Riley Kill in the next 6 weeks.      Tereso Newcomer, PA-C  Electronically Signed      Jesse Sans. Daleen Squibb, MD, [facility]  Electronically Signed   SW/MedQ  DD: 10/13/2006  DT: 10/13/2006  Job #: [id number]

## 2010-12-25 NOTE — Discharge Summary (Signed)
NAME:  Steve Gordon, MIERZWA NO.:  [id number]   MEDICAL RECORD NO.:  [id number]          PATIENT TYPE:  INP   LOCATION:  6527                         FACILITY:  [facility]   PHYSICIAN:  Arturo Morton. Riley Kill, MD, FACCDATE OF BIRTH:  10-11-1935   DATE OF ADMISSION:  10/04/2006  DATE OF DISCHARGE:  10/06/2006                               DISCHARGE SUMMARY   PRINCIPAL DIAGNOSIS:  Unstable angina/coronary artery disease.   SECONDARY DIAGNOSES:  1. Hypertension.  2. Hyperlipidemia.  3. Statin intolerance.  4. History of left ankle surgery.  5. Remote tobacco abuse.   ALLERGIES:  NO KNOWN DRUG ALLERGIES.   PROCEDURES:  Left heart cardiac catheterization with successful PCI and  stenting of the mid-right coronary artery with a 4.0 x 16 mm Liberte  bare metal stent.   HISTORY OF PRESENT ILLNESS:  A 75 year old married Caucasian male with  prior history of coronary artery disease status post CABG x4 in August  of 2003 with a LIMA to the LAD, vein graft to the first diagonal, vein  graft to the OM1, and vein graft to the PDA.  He was seen in our office  on February 26 with complaints of both rest and exertional substernal  chest discomfort and decision was made to admit him for further  evaluation.   HOSPITAL COURSE:  The patient ruled out for myocardial infarction by  cardiac enzymes and ECG.  Left heart cardiac catheterization was  performed on February 27 by Dr. Tonny Bollman revealing diffuse 50%  restenosis within the proximal LAD stent, 80% stenosis in the proximal  OM3 and a 99% stenosis in the mid to distal right coronary artery.  The  vein graft to the diagonal was patent and the LIMA to the LAD was  atretic and filling retrogradely.  Both the vein graft to the obtuse  marginal and vein graft to the PDA were occluded.  EF was 60%.  Attention was turned to the right coronary artery stenosis which was  felt to be the culprit for his angina and he underwent successful  placement of 4.0 x 16 mm Liberte bare metal stent.  He tolerated this  procedure well and postprocedure both cardiac markers and ECG have  remained stable.  He has been ambulating without difficulty or recurrent  symptoms and will be discharged home today in satisfactory condition.   DISCHARGE LABS:  Hemoglobin 13.1, hematocrit 37.7, WBC 7.2, platelets  148.  Sodium 137, potassium 4.1, chloride 107, CO2 23, BUN 16,  creatinine 1.2, glucose 108, total bilirubin 0.2, alkaline phosphatase  77, AST 27, ALT 26, total protein 6.8, albumin 3.8, calcium 8.9, CK 84,  MB 2.3, troponin I 0.11, total cholesterol 163, triglycerides 132, HDL  32, LDL 104.   DISPOSITION:  The patient is being discharged home today in good  condition.   FOLLOW-UP APPOINTMENTS:  He has a follow-up appointment with Dr.  Rosalyn Charters PA or nurse practitioner on March 6 at 10:15 a.m.   DISCHARGE MEDICATIONS:  Atenolol 50 mg daily, Zetia 10 mg daily, aspirin  325 mg daily, Plavix 75 mg daily, Norvasc 10  mg daily, nitroglycerin 0.4  mg sublingual p.r.n. chest pain.   OUTSTANDING LAB STUDIES:  None.   DURATION DISCHARGE ENCOUNTER:  40 minutes including physician time.      Nicolasa Ducking, ANP      Arturo Morton. Riley Kill, MD, [facility]  Electronically Signed    CB/MEDQ  D:  10/06/2006  T:  10/06/2006  Job:  [id number]

## 2011-01-19 ENCOUNTER — Emergency Department (HOSPITAL_COMMUNITY)
Admission: EM | Admit: 2011-01-19 | Discharge: 2011-01-19 | Disposition: A | Discharge status: Home / Self Care | Payer: BC Managed Care – PPO | Attending: Emergency Medicine | Admitting: Emergency Medicine

## 2011-01-19 ENCOUNTER — Emergency Department (HOSPITAL_COMMUNITY): Payer: BC Managed Care – PPO

## 2011-01-19 DIAGNOSIS — Z7982 Long term (current) use of aspirin: Secondary | ICD-10-CM | POA: Insufficient documentation

## 2011-01-19 DIAGNOSIS — W010XXA Fall on same level from slipping, tripping and stumbling without subsequent striking against object, initial encounter: Secondary | ICD-10-CM | POA: Insufficient documentation

## 2011-01-19 DIAGNOSIS — Z79899 Other long term (current) drug therapy: Secondary | ICD-10-CM | POA: Insufficient documentation

## 2011-01-19 DIAGNOSIS — S0003XA Contusion of scalp, initial encounter: Secondary | ICD-10-CM | POA: Insufficient documentation

## 2011-01-19 DIAGNOSIS — S2239XA Fracture of one rib, unspecified side, initial encounter for closed fracture: Secondary | ICD-10-CM | POA: Insufficient documentation

## 2011-01-19 DIAGNOSIS — Y92009 Unspecified place in unspecified non-institutional (private) residence as the place of occurrence of the external cause: Secondary | ICD-10-CM | POA: Insufficient documentation

## 2011-01-19 DIAGNOSIS — Z7902 Long term (current) use of antithrombotics/antiplatelets: Secondary | ICD-10-CM | POA: Insufficient documentation

## 2011-01-19 DIAGNOSIS — I2510 Atherosclerotic heart disease of native coronary artery without angina pectoris: Secondary | ICD-10-CM | POA: Insufficient documentation

## 2011-01-19 DIAGNOSIS — I10 Essential (primary) hypertension: Secondary | ICD-10-CM | POA: Insufficient documentation

## 2011-01-19 DIAGNOSIS — E7800 Pure hypercholesterolemia, unspecified: Secondary | ICD-10-CM | POA: Insufficient documentation

## 2011-01-19 DIAGNOSIS — Y998 Other external cause status: Secondary | ICD-10-CM | POA: Insufficient documentation

## 2011-01-19 DIAGNOSIS — S1093XA Contusion of unspecified part of neck, initial encounter: Secondary | ICD-10-CM | POA: Insufficient documentation

## 2011-01-29 ENCOUNTER — Telehealth: Payer: Self-pay | Admitting: Cardiology

## 2011-01-29 NOTE — Telephone Encounter (Signed)
Pt has been receiving case management services from [employer]

## 2011-01-29 NOTE — Telephone Encounter (Signed)
SPOKE WITH PT  HAS SPOKE WITH A LADY FROM [employer]  DIFFER TIMES RE  HEALTH ISSUES NOTHING REQUIRED FROM THIS OFFICE AT THIS TIME IS ON LY  AN FYI.WILL FORWARD TO DR Riley Kill .Steve Gordon

## 2011-03-03 ENCOUNTER — Encounter: Payer: Self-pay | Admitting: Cardiology

## 2011-03-03 ENCOUNTER — Ambulatory Visit (INDEPENDENT_AMBULATORY_CARE_PROVIDER_SITE_OTHER): Payer: BC Managed Care – PPO | Admitting: Cardiology

## 2011-03-03 VITALS — BP 130/78 | HR 48 | Ht 68.0 in | Wt 171.0 lb

## 2011-03-03 DIAGNOSIS — I2510 Atherosclerotic heart disease of native coronary artery without angina pectoris: Secondary | ICD-10-CM

## 2011-03-03 DIAGNOSIS — E7800 Pure hypercholesterolemia, unspecified: Secondary | ICD-10-CM

## 2011-03-03 DIAGNOSIS — I951 Orthostatic hypotension: Secondary | ICD-10-CM

## 2011-03-03 DIAGNOSIS — I10 Essential (primary) hypertension: Secondary | ICD-10-CM

## 2011-03-03 DIAGNOSIS — R911 Solitary pulmonary nodule: Secondary | ICD-10-CM

## 2011-03-03 DIAGNOSIS — I25810 Atherosclerosis of coronary artery bypass graft(s) without angina pectoris: Secondary | ICD-10-CM

## 2011-03-03 DIAGNOSIS — J984 Other disorders of lung: Secondary | ICD-10-CM

## 2011-03-03 NOTE — Patient Instructions (Signed)
Your physician recommends that you schedule a follow-up appointment in: 6 months with Dr. Stuckey   

## 2011-03-03 NOTE — Progress Notes (Signed)
HPI:  Patient is in for follow up.  He got up quickly and passed out, hitting his head, rib, and floor.  He was seen by EMS, but they did not take him to the hospital.  He had a leg cramp that started all of this.  He went to the ER a few days later, and then was noted to have a broken rib.   Current Outpatient Prescriptions  Medication Sig Dispense Refill  . amLODipine (NORVASC) 10 MG tablet Take 1 tablet (10 mg total) by mouth daily.  90 tablet  3  . aspirin 81 MG tablet Take 81 mg by mouth daily.        Marland Kitchen atenolol (TENORMIN) 50 MG tablet Take 1 tablet (50 mg total) by mouth daily.  90 tablet  3  . clopidogrel (PLAVIX) 75 MG tablet Take 1 tablet (75 mg total) by mouth daily.  90 tablet  3  . nitroGLYCERIN (NITROSTAT) 0.4 MG SL tablet Place 1 tablet (0.4 mg total) under the tongue every 5 (five) minutes as needed.  25 tablet  3  . POTASSIUM PO Take 90 mg by mouth daily.          No Known Allergies  Past Medical History  Diagnosis Date  . MI (myocardial infarction)   . CAD (coronary artery disease)   . S/P CABG (coronary artery bypass graft)   . Dyslipidemia   . Statin intolerance     Past Surgical History  Procedure Date  . Coronary artery bypass graft 2003    x4 LIMA to LAD SVG digonal, OM, PDA  . Coronary stent placement 10/05/2006    4.81mmx 16mm Liberte non DES  . Laparoscopic incisional / umbilical / ventral hernia repair 2004  . Bronchogenic cyst resection 08/06/1997    Family History  Problem Relation Age of Onset  . Hypertension Other   . Heart disease Other   . Kidney cancer Other     History   Social History  . Marital Status: Married    Spouse Name: N/A    Number of Children: N/A  . Years of Education: N/A   Occupational History  . Not on file.   Social History Main Topics  . Smoking status: Former Games developer  . Smokeless tobacco: Not on file  . Alcohol Use:   . Drug Use:   . Sexually Active:    Other Topics Concern  . Not on file   Social History  Narrative  . No narrative on file    ROS: Please see the HPI.  All other systems reviewed and negative.  PHYSICAL EXAM:  BP 130/78  Pulse 48  Ht 5\' 8"  (1.727 m)  Wt 171 lb (77.565 kg)  BMI 26.00 kg/m2  General: Well developed, well nourished, in no acute distress. Head:  Normocephalic and atraumatic. Neck: no JVD Lungs: Clear to auscultation and percussion. Heart: Normal S1 and S2.  No murmur, rubs or gallops.  Abdomen:  Normal bowel sounds; soft; non tender; no organomegaly Pulses: Pulses normal in all 4 extremities. Extremities: No clubbing or cyanosis. No edema. Neurologic: Alert and oriented x 3.  EKG:  Marked SB, otherwise normal  ASSESSMENT AND PLAN:

## 2011-03-21 DIAGNOSIS — I951 Orthostatic hypotension: Secondary | ICD-10-CM | POA: Insufficient documentation

## 2011-03-21 NOTE — Assessment & Plan Note (Signed)
Currently adequately controlled.  No major issues at present.

## 2011-03-21 NOTE — Assessment & Plan Note (Signed)
No current angina, and seems to be doing well.  Should remain on DAPT.

## 2011-03-21 NOTE — Assessment & Plan Note (Signed)
Possibly led to event as he got out of bed with a quick jump due to leg cramp.  Not present on exam now.  Reminded to get up slowly at night.

## 2011-03-21 NOTE — Assessment & Plan Note (Signed)
Does not tolerate statins.

## 2011-03-21 NOTE — Assessment & Plan Note (Signed)
See overview.  Not significant.

## 2011-08-25 IMAGING — CT CT HEAD W/O CM
4 of 6 series · 17 of 47 positions shown, 19 images · non-contrast
Comparison: None.

CT HEAD

CLINICAL DATA: Status post fall with head and face pain and
swelling.  Neck pain.

CT HEAD WITHOUT CONTRAST
CT MAXILLOFACIAL WITHOUT CONTRAST
CT CERVICAL SPINE WITHOUT CONTRAST
TECHNIQUE: Multidetector CT imaging of the head, cervical spine,
and maxillofacial structures were performed using the standard
protocol without intravenous contrast. Multiplanar CT image
reconstructions of the cervical spine and maxillofacial structures
were also generated.

[Series 7: facial 2.0 h31s st · axial · 0.44mm/px · z∈[-210,-74]mm · 7 of 92 slices shown, 9 images]
[im 12/92  brain]
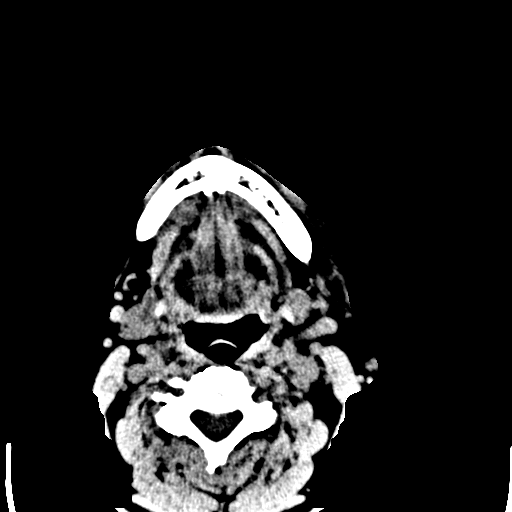
[im 12/92  bone]
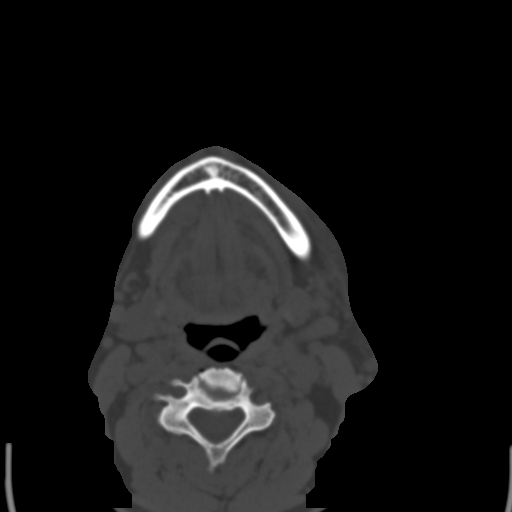
[im 23/92  brain]
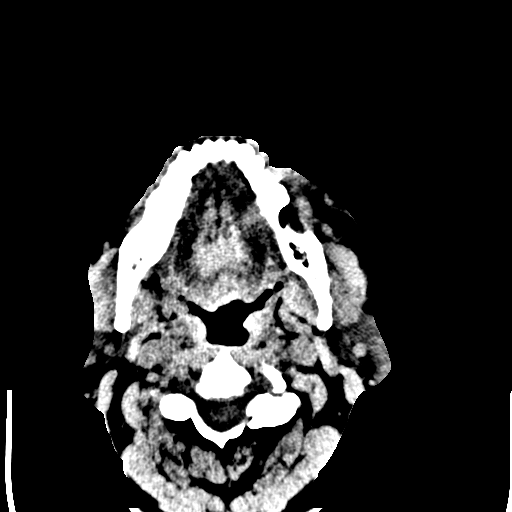
[im 35/92  brain]
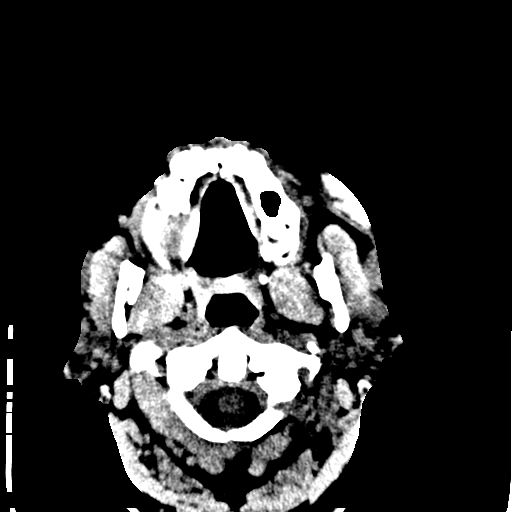
[im 46/92  brain]
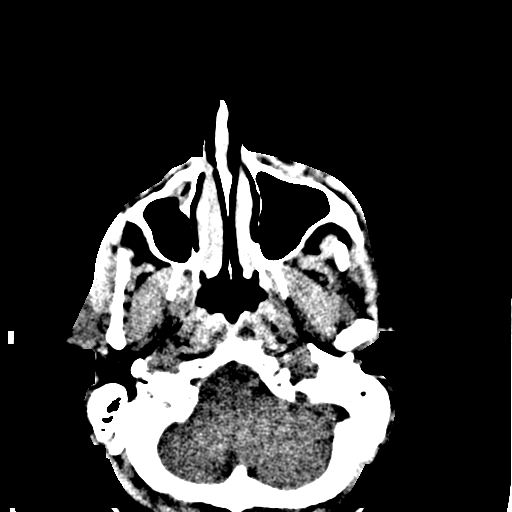
[im 57/92  brain]
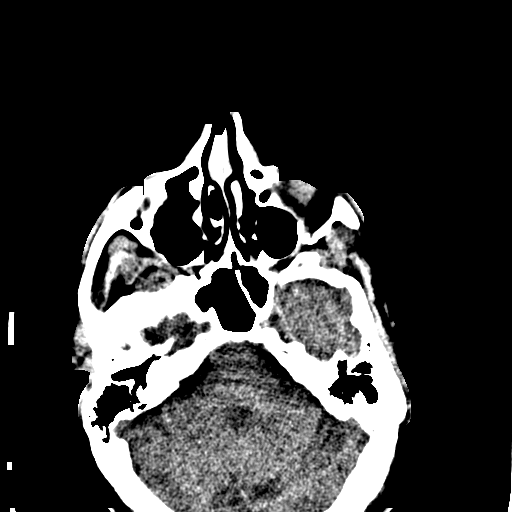
[im 57/92  bone]
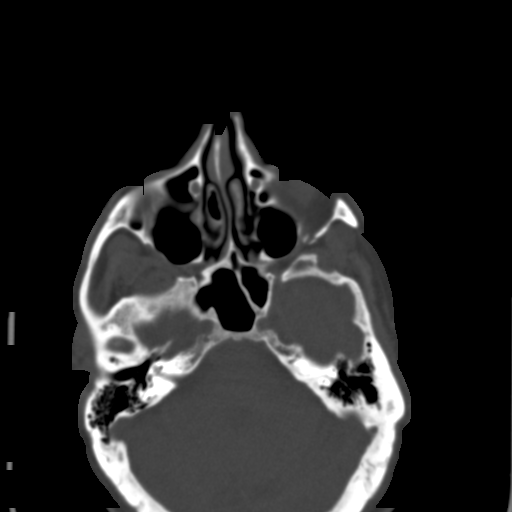
[im 69/92  brain]
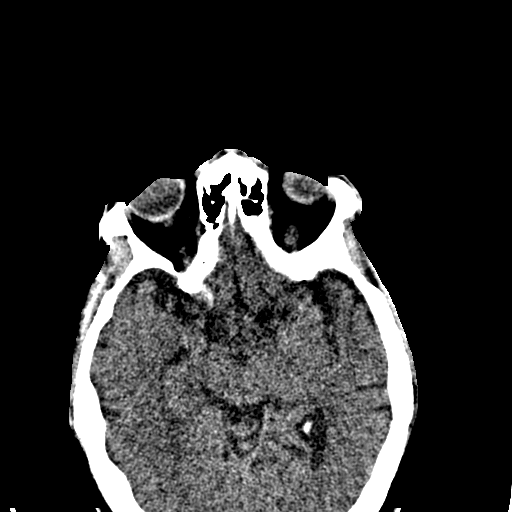
[im 80/92  brain]
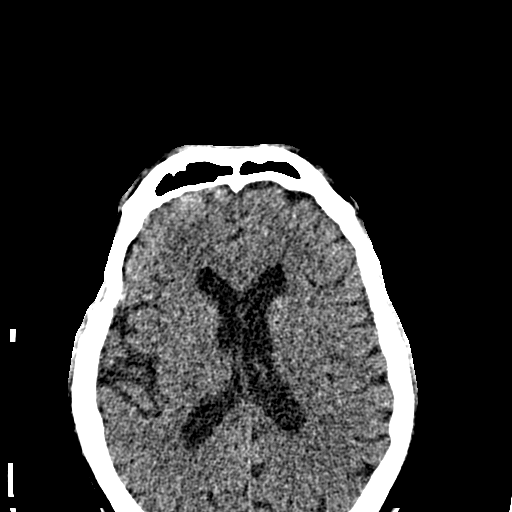

[Series 603: st cor · coronal · 0.44mm/px · 3 of 52 slices shown]
[im 18/52  brain]
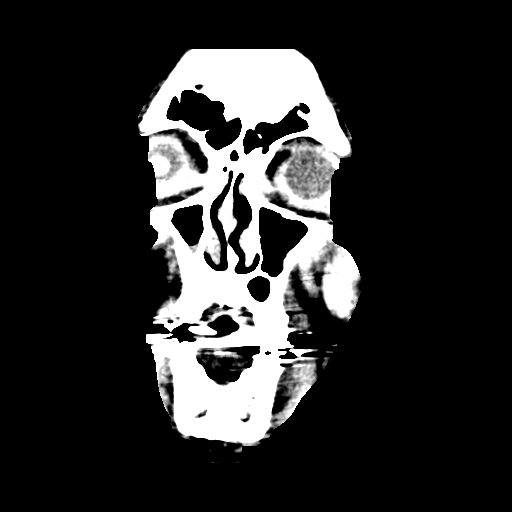
[im 23/52  brain]
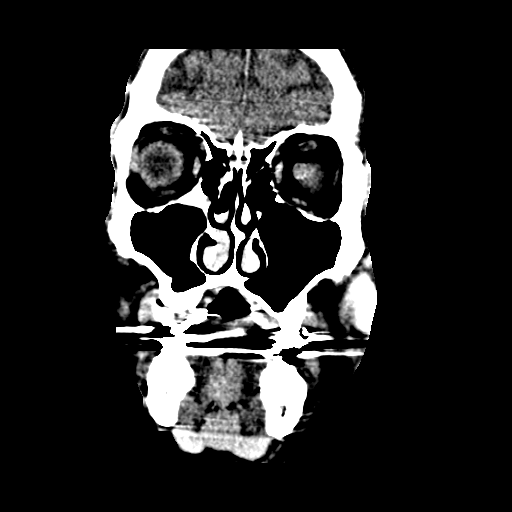
[im 29/52  brain]
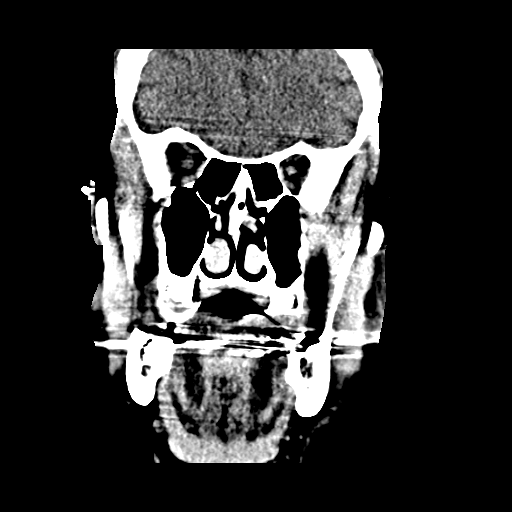

[Series 604: st sag · sagittal · 0.44mm/px · 3 of 56 slices shown]
[im 19/56  brain]
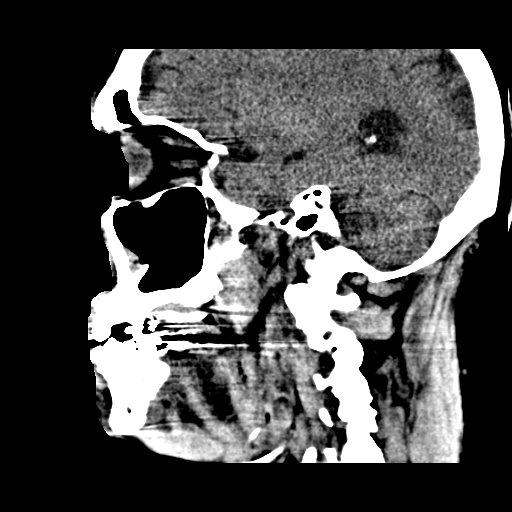
[im 28/56  brain]
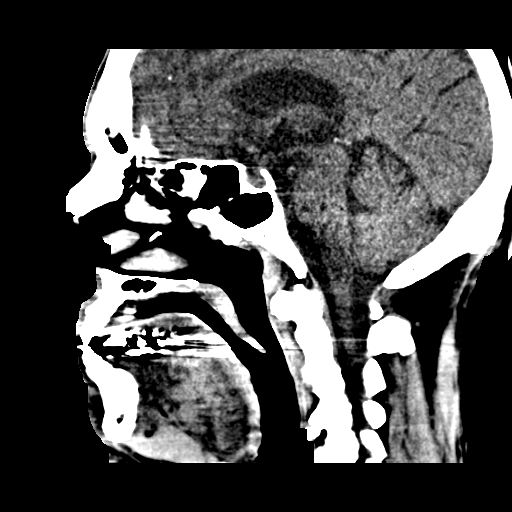
[im 37/56  brain]
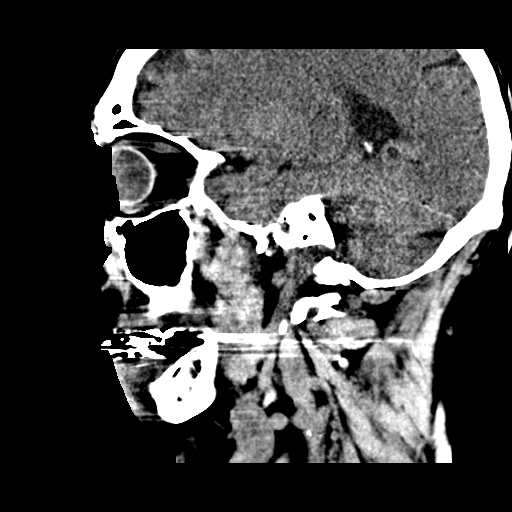

[Series 605: axials · axial · 0.22mm/px · z∈[-289,-219]mm · 4 of 83 slices shown]
[im 12/83  brain]
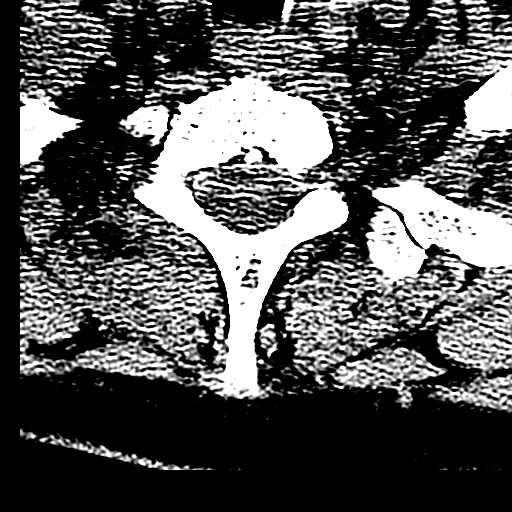
[im 24/83  brain]
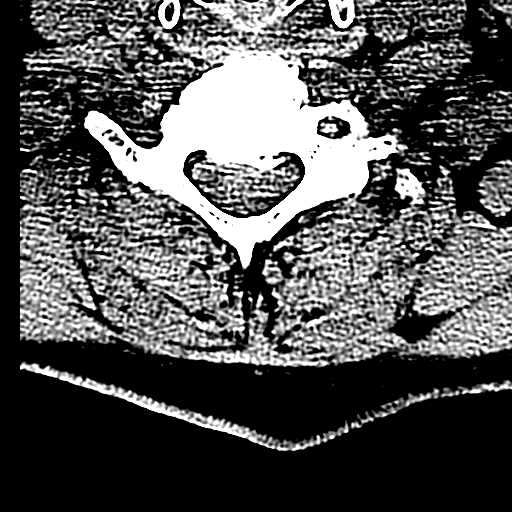
[im 36/83  brain]
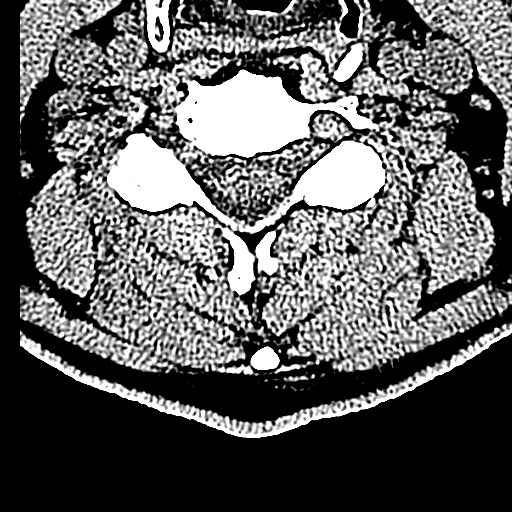
[im 47/83  brain]
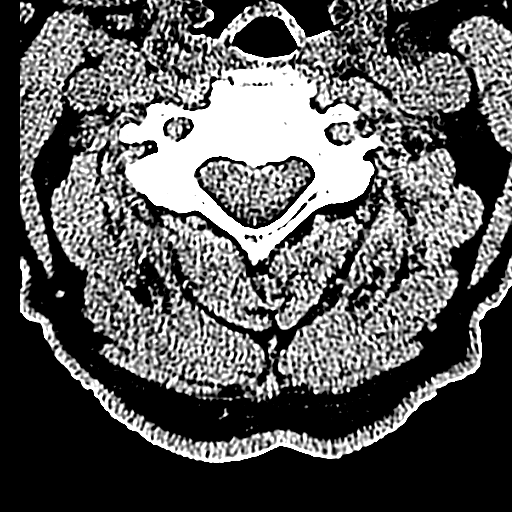

[17 of 47 positions shown; findings below may reference images not displayed]

FINDINGS: There is some cortical atrophy.  No evidence of acute
infarction, hemorrhage, mass lesion, mass effect, midline shift or
abnormal extra-axial fluid collection.  No hydrocephalus or
pneumocephalus.  The calvarium is intact.
IMPRESSION: No acute finding.

CT MAXILLOFACIAL
FINDINGS: Soft tissue contusions are seen about the left side of
the face with a focal hematoma identified in the subcutaneous
tissues measuring 2.9 x 1.5 at 3.1 cm.  No facial bone fracture is
seen.  Small amount of fluid is noted in the left mastoid air
cells.  The right mastoid air cells are clear.  Mild mucosal
thickening is seen in the anterior aspect of the right maxillary
sinus.  Globes are intact and lenses are located.  Orbital fat
appears normal.
IMPRESSION: 1.  Soft tissue contusion and hematoma on the left side of the face
without underlying fracture.
2.  Small amount of fluid the left mastoid air cells is identified.
No fracture is seen.

CT CERVICAL SPINE
FINDINGS: There is incomplete fusion of the posterior arch of C1,
a congenital anomaly.  No fracture or subluxation is identified.
The patient has multilevel degenerative disc disease.  There is
also facet degenerative change.  Lung apices are clear.
IMPRESSION: No acute finding.  Multilevel spondylosis.

## 2011-08-31 ENCOUNTER — Ambulatory Visit (INDEPENDENT_AMBULATORY_CARE_PROVIDER_SITE_OTHER): Payer: BC Managed Care – PPO | Admitting: Cardiology

## 2011-08-31 VITALS — BP 132/80 | HR 53 | Ht 69.0 in | Wt 165.0 lb

## 2011-08-31 DIAGNOSIS — E7800 Pure hypercholesterolemia, unspecified: Secondary | ICD-10-CM

## 2011-08-31 DIAGNOSIS — R911 Solitary pulmonary nodule: Secondary | ICD-10-CM

## 2011-08-31 DIAGNOSIS — I10 Essential (primary) hypertension: Secondary | ICD-10-CM

## 2011-08-31 DIAGNOSIS — J984 Other disorders of lung: Secondary | ICD-10-CM

## 2011-08-31 DIAGNOSIS — I2510 Atherosclerotic heart disease of native coronary artery without angina pectoris: Secondary | ICD-10-CM

## 2011-08-31 MED ORDER — NITROGLYCERIN 0.4 MG SL SUBL: 0.4000 mg | SUBLINGUAL_TABLET | SUBLINGUAL | Status: DC | PRN

## 2011-08-31 NOTE — Assessment & Plan Note (Signed)
No recurrent symptoms at present.  Prior OM stenting.  Has remained on DAPT.  Will consider options.

## 2011-08-31 NOTE — Progress Notes (Signed)
   HPI:  He is doing well.  He has maintained his weight in the 160 range.  Cannot tolerate statins very well.  No chest pain.  Does not have a primary care doctor, and is somewhat philosophical about the fact that everyone needs to die, and he related a number of stories of people that got things check out, and ended up doing worse.  Stable at present.  He continues to work on cars.    Current Outpatient Prescriptions  Medication Sig Dispense Refill  . amLODipine (NORVASC) 10 MG tablet Take 1 tablet (10 mg total) by mouth daily.  90 tablet  3  . aspirin 81 MG tablet Take 81 mg by mouth daily.        Marland Kitchen atenolol (TENORMIN) 50 MG tablet Take 1 tablet (50 mg total) by mouth daily.  90 tablet  3  . clopidogrel (PLAVIX) 75 MG tablet Take 1 tablet (75 mg total) by mouth daily.  90 tablet  3  . nitroGLYCERIN (NITROSTAT) 0.4 MG SL tablet Place 1 tablet (0.4 mg total) under the tongue every 5 (five) minutes as needed.  25 tablet  3    No Known Allergies  Past Medical History  Diagnosis Date  . MI (myocardial infarction)   . CAD (coronary artery disease)   . S/P CABG (coronary artery bypass graft)   . Dyslipidemia   . Statin intolerance     Past Surgical History  Procedure Date  . Coronary artery bypass graft 2003    x4 LIMA to LAD SVG digonal, OM, PDA  . Coronary stent placement 10/05/2006    4.52mmx 16mm Liberte non DES  . Laparoscopic incisional / umbilical / ventral hernia repair 2004  . Bronchogenic cyst resection 08/06/1997    Family History  Problem Relation Age of Onset  . Hypertension Other   . Heart disease Other   . Kidney cancer Other     History   Social History  . Marital Status: Married    Spouse Name: N/A    Number of Children: N/A  . Years of Education: N/A   Occupational History  . Not on file.   Social History Main Topics  . Smoking status: Former Games developer  . Smokeless tobacco: Not on file  . Alcohol Use:   . Drug Use:   . Sexually Active:    Other  Topics Concern  . Not on file   Social History Narrative  . No narrative on file    ROS: Please see the HPI.  All other systems reviewed and negative.  PHYSICAL EXAM:  BP 132/80  Pulse 53  Ht 5\' 9"  (1.753 m)  Wt 74.844 kg (165 lb)  BMI 24.37 kg/m2  General: Well developed, well nourished, in no acute distress. Head:  Normocephalic and atraumatic. Neck: no JVD Lungs: Clear to auscultation and percussion. Heart: Normal S1 and S2.  No murmur, rubs or gallops.  Pulses: Pulses normal in all 4 extremities. Extremities: No clubbing or cyanosis. No edema. Neurologic: Alert and oriented x 3.  EKG:  SB, otherwise normal tracing.   ASSESSMENT AND PLAN:

## 2011-08-31 NOTE — Assessment & Plan Note (Signed)
Cannot take statins.  Has lost weight.  Continue conservative management.

## 2011-08-31 NOTE — Patient Instructions (Signed)
Your physician wants you to follow-up in: 6 MONTHS.  You will receive a reminder letter in the mail two months in advance. If you don't receive a letter, please call our office to schedule the follow-up appointment.  Your physician recommends that you continue on your current medications as directed. Please refer to the Current Medication list given to you today.  

## 2011-08-31 NOTE — Assessment & Plan Note (Signed)
Good control on current regimen. 

## 2011-08-31 NOTE — Assessment & Plan Note (Signed)
See findings as noted on CT of chest.  No further work up.

## 2011-11-04 ENCOUNTER — Other Ambulatory Visit: Payer: Self-pay

## 2011-11-04 ENCOUNTER — Other Ambulatory Visit: Payer: Self-pay | Admitting: Cardiology

## 2011-11-04 DIAGNOSIS — I2510 Atherosclerotic heart disease of native coronary artery without angina pectoris: Secondary | ICD-10-CM

## 2011-11-04 MED ORDER — CLOPIDOGREL BISULFATE 75 MG PO TABS: 75.0000 mg | ORAL_TABLET | Freq: Every day | ORAL | Status: DC

## 2011-11-05 ENCOUNTER — Other Ambulatory Visit: Payer: Self-pay | Admitting: Cardiology

## 2011-12-14 ENCOUNTER — Encounter: Payer: Self-pay | Admitting: Cardiology

## 2011-12-14 ENCOUNTER — Ambulatory Visit (INDEPENDENT_AMBULATORY_CARE_PROVIDER_SITE_OTHER): Payer: BC Managed Care – PPO | Admitting: Cardiology

## 2011-12-14 VITALS — BP 124/70 | HR 45 | Ht 69.0 in | Wt 166.8 lb

## 2011-12-14 DIAGNOSIS — E7800 Pure hypercholesterolemia, unspecified: Secondary | ICD-10-CM

## 2011-12-14 DIAGNOSIS — I2510 Atherosclerotic heart disease of native coronary artery without angina pectoris: Secondary | ICD-10-CM

## 2011-12-14 DIAGNOSIS — I10 Essential (primary) hypertension: Secondary | ICD-10-CM

## 2011-12-14 DIAGNOSIS — I498 Other specified cardiac arrhythmias: Secondary | ICD-10-CM

## 2011-12-14 DIAGNOSIS — R001 Bradycardia, unspecified: Secondary | ICD-10-CM | POA: Insufficient documentation

## 2011-12-14 DIAGNOSIS — I25810 Atherosclerosis of coronary artery bypass graft(s) without angina pectoris: Secondary | ICD-10-CM

## 2011-12-14 NOTE — Assessment & Plan Note (Signed)
Rate is quite slow.  Will reduce atenolol and have him return for follow up of BP in the office in one month.

## 2011-12-14 NOTE — Patient Instructions (Signed)
Your physician has requested that you regularly monitor and record your blood pressure readings at home. Please use the same machine at the same time of day to check your readings and record them to bring to your follow-up visit.  Your physician recommends that you schedule a follow-up appointment in: 1 MONTH with Dr Riley Kill  Your physician has recommended you make the following change in your medication: DECREASE Atenolol to 25mg  once a day (You can take half of your 50mg  tablet)

## 2011-12-14 NOTE — Assessment & Plan Note (Signed)
No recurrent angina at the present time.   

## 2011-12-14 NOTE — Assessment & Plan Note (Signed)
Appears well controlled.  Will have him return and check his BP after we reduce his atenolol.  Otherwise, no change.

## 2011-12-14 NOTE — Assessment & Plan Note (Signed)
Most recent are improved.  Unable to take statins.

## 2011-12-14 NOTE — Progress Notes (Signed)
HPI: The patient is seen today in a followup visit. He recently developed some shaking chills and felt cold. Just prior to that, the patient had burning in his urine. His wife called 911 to the house and they checked him over and thought he was okay. He subsequently went  urology the following day, it was discovered that he had a urinary tract infection. He was placed on Cipro and has had complete relief of his symptoms. He's had no further shaking chills. He feels okay. He denies shortness of breath or cough. He is scheduled to have followup in the urology clinic, and importantly, I reminded him that a followup urine culture is important. He denies any current chest pain.  Current Outpatient Prescriptions  Medication Sig Dispense Refill  . amLODipine (NORVASC) 10 MG tablet TAKE 1 TABLET DAILY  90 tablet  2  . aspirin 81 MG tablet Take 81 mg by mouth daily.        Marland Kitchen atenolol (TENORMIN) 25 MG tablet Take 1 tablet (25 mg total) by mouth daily.  1 tablet  0  . ciprofloxacin (CIPRO) 500 MG tablet Take 500 mg by mouth 2 (two) times daily.       . clopidogrel (PLAVIX) 75 MG tablet Take 1 tablet (75 mg total) by mouth daily.  90 tablet  1  . nitroGLYCERIN (NITROSTAT) 0.4 MG SL tablet Place 1 tablet (0.4 mg total) under the tongue every 5 (five) minutes as needed.  25 tablet  3  . DISCONTD: atenolol (TENORMIN) 50 MG tablet TAKE 1 TABLET DAILY  90 tablet  2    No Known Allergies  Past Medical History  Diagnosis Date  . MI (myocardial infarction)   . CAD (coronary artery disease)   . S/P CABG (coronary artery bypass graft)   . Dyslipidemia   . Statin intolerance     Past Surgical History  Procedure Date  . Coronary artery bypass graft 2003    x4 LIMA to LAD SVG digonal, OM, PDA  . Coronary stent placement 10/05/2006    4.36mmx 16mm Liberte non DES  . Laparoscopic incisional / umbilical / ventral hernia repair 2004  . Bronchogenic cyst resection 08/06/1997    Family History  Problem  Relation Age of Onset  . Hypertension Other   . Heart disease Other   . Kidney cancer Other     History   Social History  . Marital Status: Married    Spouse Name: N/A    Number of Children: N/A  . Years of Education: N/A   Occupational History  . Not on file.   Social History Main Topics  . Smoking status: Former Games developer  . Smokeless tobacco: Not on file  . Alcohol Use:   . Drug Use:   . Sexually Active:    Other Topics Concern  . Not on file   Social History Narrative  . No narrative on file    ROS: Please see the HPI.  All other systems reviewed and negative.  PHYSICAL EXAM:  BP 124/70  Pulse 45  Ht 5\' 9"  (1.753 m)  Wt 166 lb 12.8 oz (75.66 kg)  BMI 24.63 kg/m2  General: Well developed, well nourished, in no acute distress. Head:  Normocephalic and atraumatic. Neck: no JVD Lungs: Clear to auscultation and percussion. Heart: Normal S1 and S2.  No murmur, rubs or gallops.  Abdomen:  Normal bowel sounds; soft; non tender; no organomegaly Pulses: Pulses normal in all 4 extremities. Extremities: No clubbing or  cyanosis. No edema. Neurologic: Alert and oriented x 3.  EKG:  Marked sinus bradycardia.  ASSESSMENT AND PLAN:

## 2012-01-19 ENCOUNTER — Ambulatory Visit (INDEPENDENT_AMBULATORY_CARE_PROVIDER_SITE_OTHER): Payer: BC Managed Care – PPO | Admitting: Cardiology

## 2012-01-19 ENCOUNTER — Encounter: Payer: Self-pay | Admitting: Cardiology

## 2012-01-19 VITALS — BP 126/70 | HR 58 | Ht 69.0 in | Wt 161.4 lb

## 2012-01-19 DIAGNOSIS — I498 Other specified cardiac arrhythmias: Secondary | ICD-10-CM

## 2012-01-19 DIAGNOSIS — I2510 Atherosclerotic heart disease of native coronary artery without angina pectoris: Secondary | ICD-10-CM

## 2012-01-19 DIAGNOSIS — R001 Bradycardia, unspecified: Secondary | ICD-10-CM

## 2012-01-19 DIAGNOSIS — I951 Orthostatic hypotension: Secondary | ICD-10-CM

## 2012-01-19 NOTE — Assessment & Plan Note (Signed)
BP today 110 lying and 120 standing.  Did not drop.

## 2012-01-19 NOTE — Assessment & Plan Note (Signed)
improved

## 2012-01-19 NOTE — Progress Notes (Signed)
   HPI:  He is doing well. He's had no more shaking chills. He's had followup in the urology office, and he rechecked his urinalysis. He's not having any further problems. He does get lightheaded when he stands up.  Current Outpatient Prescriptions  Medication Sig Dispense Refill  . amLODipine (NORVASC) 10 MG tablet TAKE 1 TABLET DAILY  90 tablet  2  . aspirin 81 MG tablet Take 81 mg by mouth daily.        Marland Kitchen atenolol (TENORMIN) 25 MG tablet Take 1 tablet (25 mg total) by mouth daily.  1 tablet  0  . clopidogrel (PLAVIX) 75 MG tablet Take 1 tablet (75 mg total) by mouth daily.  90 tablet  1  . nitroGLYCERIN (NITROSTAT) 0.4 MG SL tablet Place 1 tablet (0.4 mg total) under the tongue every 5 (five) minutes as needed.  25 tablet  3    No Known Allergies  Past Medical History  Diagnosis Date  . MI (myocardial infarction)   . CAD (coronary artery disease)   . S/P CABG (coronary artery bypass graft)   . Dyslipidemia   . Statin intolerance     Past Surgical History  Procedure Date  . Coronary artery bypass graft 2003    x4 LIMA to LAD SVG digonal, OM, PDA  . Coronary stent placement 10/05/2006    4.65mmx 16mm Liberte non DES  . Laparoscopic incisional / umbilical / ventral hernia repair 2004  . Bronchogenic cyst resection 08/06/1997    Family History  Problem Relation Age of Onset  . Hypertension Other   . Heart disease Other   . Kidney cancer Other     History   Social History  . Marital Status: Married    Spouse Name: N/A    Number of Children: N/A  . Years of Education: N/A   Occupational History  . Not on file.   Social History Main Topics  . Smoking status: Former Games developer  . Smokeless tobacco: Not on file  . Alcohol Use:   . Drug Use:   . Sexually Active:    Other Topics Concern  . Not on file   Social History Narrative  . No narrative on file    ROS: Please see the HPI.  All other systems reviewed and negative.  PHYSICAL EXAM:  BP 126/70  Pulse 58   Ht 5\' 9"  (1.753 m)  Wt 161 lb 6.4 oz (73.211 kg)  BMI 23.83 kg/m2  General: Well developed, well nourished, in no acute distress. Head:  Normocephalic and atraumatic. Neck: no JVD Lungs: Clear to auscultation and percussion. Heart: Normal S1 and S2.  No murmur, rubs or gallops.  Pulses: Pulses normal in all 4 extremities. Extremities: No clubbing or cyanosis. No edema. Neurologic: Alert and oriented x 3.  EKG:  ASSESSMENT AND PLAN:

## 2012-01-19 NOTE — Assessment & Plan Note (Signed)
No angina 

## 2012-01-19 NOTE — Patient Instructions (Addendum)
Your physician recommends that you schedule a follow-up appointment in: 2 MONTHS  

## 2012-03-29 ENCOUNTER — Encounter: Payer: Self-pay | Admitting: Cardiology

## 2012-03-29 ENCOUNTER — Ambulatory Visit (INDEPENDENT_AMBULATORY_CARE_PROVIDER_SITE_OTHER): Payer: BC Managed Care – PPO | Admitting: Cardiology

## 2012-03-29 VITALS — BP 130/70 | HR 47 | Ht 69.0 in | Wt 162.8 lb

## 2012-03-29 DIAGNOSIS — I951 Orthostatic hypotension: Secondary | ICD-10-CM

## 2012-03-29 DIAGNOSIS — I2510 Atherosclerotic heart disease of native coronary artery without angina pectoris: Secondary | ICD-10-CM

## 2012-03-29 DIAGNOSIS — I10 Essential (primary) hypertension: Secondary | ICD-10-CM

## 2012-03-29 MED ORDER — CLOPIDOGREL BISULFATE 75 MG PO TABS: 75.0000 mg | ORAL_TABLET | Freq: Every day | ORAL | Status: DC

## 2012-03-29 NOTE — Assessment & Plan Note (Signed)
Reminded him to drink fluids.

## 2012-03-29 NOTE — Assessment & Plan Note (Signed)
Continues to remain stable.  No chest pain.  Feels good.

## 2012-03-29 NOTE — Assessment & Plan Note (Signed)
Remains controlled. 

## 2012-03-29 NOTE — Progress Notes (Signed)
   HPI:  Patient is in for followup. From a clinical standpoint, he is really doing quite well. He is absolutely had no symptoms. He denies any syncope or presyncope.  Current Outpatient Prescriptions  Medication Sig Dispense Refill  . amLODipine (NORVASC) 10 MG tablet TAKE 1 TABLET DAILY  90 tablet  2  . aspirin 81 MG tablet Take 81 mg by mouth daily.        Marland Kitchen atenolol (TENORMIN) 25 MG tablet Take 1 tablet (25 mg total) by mouth daily.  1 tablet  0  . clopidogrel (PLAVIX) 75 MG tablet Take 1 tablet (75 mg total) by mouth daily.  90 tablet  1  . nitroGLYCERIN (NITROSTAT) 0.4 MG SL tablet Place 1 tablet (0.4 mg total) under the tongue every 5 (five) minutes as needed.  25 tablet  3    No Known Allergies  Past Medical History  Diagnosis Date  . MI (myocardial infarction)   . CAD (coronary artery disease)   . S/P CABG (coronary artery bypass graft)   . Dyslipidemia   . Statin intolerance     Past Surgical History  Procedure Date  . Coronary artery bypass graft 2003    x4 LIMA to LAD SVG digonal, OM, PDA  . Coronary stent placement 10/05/2006    4.106mmx 16mm Liberte non DES  . Laparoscopic incisional / umbilical / ventral hernia repair 2004  . Bronchogenic cyst resection 08/06/1997    Family History  Problem Relation Age of Onset  . Hypertension Other   . Heart disease Other   . Kidney cancer Other     History   Social History  . Marital Status: Married    Spouse Name: N/A    Number of Children: N/A  . Years of Education: N/A   Occupational History  . Not on file.   Social History Main Topics  . Smoking status: Former Games developer  . Smokeless tobacco: Not on file  . Alcohol Use:   . Drug Use:   . Sexually Active:    Other Topics Concern  . Not on file   Social History Narrative  . No narrative on file    ROS: Please see the HPI.  All other systems reviewed and negative.  PHYSICAL EXAM:  BP 130/70  Pulse 47  Ht 5\' 9"  (1.753 m)  Wt 162 lb 12.8 oz (73.846  kg)  BMI 24.04 kg/m2  General: Well developed, well nourished, in no acute distress. Head:  Normocephalic and atraumatic. Neck: no JVD Lungs: Clear to auscultation and percussion. Heart: Normal S1 and S2.  No murmur, rubs or gallops.  Abdomen:  Normal bowel sounds; soft; non tender; no organomegaly Pulses: Pulses normal in all 4 extremities. Extremities: No clubbing or cyanosis. No edema. Neurologic: Alert and oriented x 3.  EKG:  SB.  Otherwise normal tracing.    ASSESSMENT AND PLAN:

## 2012-03-29 NOTE — Patient Instructions (Addendum)
Your physician recommends that you continue on your current medications as directed. Please refer to the Current Medication list given to you today.  Your physician wants you to follow-up in: 6 MONTHS with Dr Stuckey.  You will receive a reminder letter in the mail two months in advance. If you don't receive a letter, please call our office to schedule the follow-up appointment.  

## 2012-07-13 ENCOUNTER — Other Ambulatory Visit: Payer: Self-pay | Admitting: *Deleted

## 2012-07-13 DIAGNOSIS — I25810 Atherosclerosis of coronary artery bypass graft(s) without angina pectoris: Secondary | ICD-10-CM

## 2012-07-13 MED ORDER — AMLODIPINE BESYLATE 10 MG PO TABS: 10.0000 mg | ORAL_TABLET | Freq: Every day | ORAL | Status: DC

## 2012-07-13 MED ORDER — ATENOLOL 25 MG PO TABS: 25.0000 mg | ORAL_TABLET | Freq: Every day | ORAL | Status: DC

## 2012-09-25 ENCOUNTER — Ambulatory Visit (INDEPENDENT_AMBULATORY_CARE_PROVIDER_SITE_OTHER): Payer: BC Managed Care – PPO | Admitting: Cardiology

## 2012-09-25 ENCOUNTER — Encounter: Payer: Self-pay | Admitting: Cardiology

## 2012-09-25 VITALS — BP 132/76 | HR 49 | Ht 69.0 in | Wt 164.0 lb

## 2012-09-25 DIAGNOSIS — I2510 Atherosclerotic heart disease of native coronary artery without angina pectoris: Secondary | ICD-10-CM

## 2012-09-25 DIAGNOSIS — E7800 Pure hypercholesterolemia, unspecified: Secondary | ICD-10-CM

## 2012-09-25 NOTE — Assessment & Plan Note (Signed)
/  On his multiple stents. He's had prior coronary bypass graft surgery. We had an extensive discussion today about the merits and problems associated with use of dual antiplatelet therapy, including risk of bleeding as well as potential that production. It is multiple stents, and the fact that he is not able to take statins, and we agreed that he would remain on dual antiplatelet therapy as he has not had any problems with that over a long period of time. We will have him see Dr. Clifton James in about 3 months in followup

## 2012-09-25 NOTE — Assessment & Plan Note (Signed)
Patient statin intolerant, but would like to have a lipid  profile checked.

## 2012-09-25 NOTE — Patient Instructions (Addendum)
Your physician recommends that you have a lipid profile--nothing to eat or drink after midnight, lab opens at 7:30 (09/26/12)   Your physician recommends that you schedule a follow-up appointment in: 4 MONTHS with Dr Clifton James (previous pt of Dr Riley Kill)  Your physician recommends that you continue on your current medications as directed. Please refer to the Current Medication list given to you today.

## 2012-09-25 NOTE — Progress Notes (Signed)
   HPI:  Patient is in for followup visit. He continues to do extremely well. He denies any chest pain.  We had an extensive discussion about aspirin and Plavix today. Overall, he has not had new cardiac events. He denies any bleeding.  Current Outpatient Prescriptions  Medication Sig Dispense Refill  . amLODipine (NORVASC) 10 MG tablet Take 1 tablet (10 mg total) by mouth daily.  90 tablet  2  . aspirin 81 MG tablet Take 81 mg by mouth daily.        Marland Kitchen atenolol (TENORMIN) 25 MG tablet Take 1 tablet (25 mg total) by mouth daily.  90 tablet  1  . clopidogrel (PLAVIX) 75 MG tablet Take 1 tablet (75 mg total) by mouth daily.  90 tablet  3  . nitroGLYCERIN (NITROSTAT) 0.4 MG SL tablet Place 1 tablet (0.4 mg total) under the tongue every 5 (five) minutes as needed.  25 tablet  3   No current facility-administered medications for this visit.    No Known Allergies  Past Medical History  Diagnosis Date  . MI (myocardial infarction)   . CAD (coronary artery disease)   . S/P CABG (coronary artery bypass graft)   . Dyslipidemia   . Statin intolerance     Past Surgical History  Procedure Laterality Date  . Coronary artery bypass graft  2003    x4 LIMA to LAD SVG digonal, OM, PDA  . Coronary stent placement  10/05/2006    4.21mmx 16mm Liberte non DES  . Laparoscopic incisional / umbilical / ventral hernia repair  2004  . Bronchogenic cyst resection  08/06/1997    Family History  Problem Relation Age of Onset  . Hypertension Other   . Heart disease Other   . Kidney cancer Other     History   Social History  . Marital Status: Married    Spouse Name: N/A    Number of Children: N/A  . Years of Education: N/A   Occupational History  . Not on file.   Social History Main Topics  . Smoking status: Former Games developer  . Smokeless tobacco: Not on file  . Alcohol Use:   . Drug Use:   . Sexually Active:    Other Topics Concern  . Not on file   Social History Narrative  . No narrative  on file    ROS: Please see the HPI.  All other systems reviewed and negative.  PHYSICAL EXAM:  BP 132/76  Pulse 49  Ht 5\' 9"  (1.753 m)  Wt 164 lb (74.39 kg)  BMI 24.21 kg/m2  SpO2 96%  General: Well developed, well nourished, in no acute distress. Head:  Normocephalic and atraumatic. Neck: no JVD Lungs: Clear to auscultation and percussion. Heart: Normal S1 and S2.  No murmur, rubs or gallops.  Extremities: No clubbing or cyanosis. No edema. Neurologic: Alert and oriented x 3.  EKG:  SB.  Otherwise normal.   ASSESSMENT AND PLAN:

## 2012-09-26 ENCOUNTER — Other Ambulatory Visit (INDEPENDENT_AMBULATORY_CARE_PROVIDER_SITE_OTHER): Payer: BC Managed Care – PPO

## 2012-09-26 DIAGNOSIS — I2510 Atherosclerotic heart disease of native coronary artery without angina pectoris: Secondary | ICD-10-CM

## 2012-09-26 DIAGNOSIS — E7800 Pure hypercholesterolemia, unspecified: Secondary | ICD-10-CM

## 2012-09-26 LAB — LIPID PANEL
Cholesterol: 168 mg/dL (ref 0–200)
HDL: 31.4 mg/dL — ABNORMAL LOW
LDL Cholesterol: 113 mg/dL — ABNORMAL HIGH (ref 0–99)
Total CHOL/HDL Ratio: 5
Triglycerides: 118 mg/dL (ref 0.0–149.0)
VLDL: 23.6 mg/dL (ref 0.0–40.0)

## 2012-10-25 ENCOUNTER — Encounter: Payer: Self-pay | Admitting: Cardiovascular Disease

## 2012-11-13 ENCOUNTER — Encounter: Payer: Self-pay | Admitting: Cardiovascular Disease

## 2013-01-29 ENCOUNTER — Ambulatory Visit: Payer: BC Managed Care – PPO | Admitting: Cardiovascular Disease

## 2013-03-06 ENCOUNTER — Ambulatory Visit: Payer: BC Managed Care – PPO | Admitting: Cardiovascular Disease

## 2013-03-12 ENCOUNTER — Other Ambulatory Visit: Payer: Self-pay | Admitting: Cardiology

## 2013-03-22 ENCOUNTER — Encounter: Payer: Self-pay | Admitting: Cardiovascular Disease

## 2013-03-22 ENCOUNTER — Ambulatory Visit (INDEPENDENT_AMBULATORY_CARE_PROVIDER_SITE_OTHER): Payer: BC Managed Care – PPO | Admitting: Cardiovascular Disease

## 2013-03-22 VITALS — BP 120/72 | HR 54 | Ht 69.0 in | Wt 164.0 lb

## 2013-03-22 DIAGNOSIS — I25810 Atherosclerosis of coronary artery bypass graft(s) without angina pectoris: Secondary | ICD-10-CM

## 2013-03-22 DIAGNOSIS — R079 Chest pain, unspecified: Secondary | ICD-10-CM

## 2013-03-22 DIAGNOSIS — I2510 Atherosclerotic heart disease of native coronary artery without angina pectoris: Secondary | ICD-10-CM

## 2013-03-22 LAB — BASIC METABOLIC PANEL
BUN: 24 mg/dL — ABNORMAL HIGH (ref 6–23)
Calcium: 8.9 mg/dL (ref 8.4–10.5)
Creatinine, Ser: 1.3 mg/dL (ref 0.4–1.5)
GFR: 55.65 mL/min — ABNORMAL LOW (ref >60.00)
Potassium: 4.2 mEq/L (ref 3.5–5.1)

## 2013-03-22 LAB — CBC WITH DIFFERENTIAL/PLATELET
Eosinophils Relative: 5.4 % — ABNORMAL HIGH (ref 0.0–5.0)
HCT: 39.9 % (ref 39.0–52.0)
Lymphocytes Relative: 22.3 % (ref 12.0–46.0)
Monocytes Relative: 10.8 % (ref 3.0–12.0)
Neutrophils Relative %: 60.9 % (ref 43.0–77.0)
Platelets: 174 10*3/uL (ref 150.0–400.0)
WBC: 5.4 10*3/uL (ref 4.5–10.5)

## 2013-03-22 MED ORDER — AMLODIPINE BESYLATE 10 MG PO TABS: 10.0000 mg | ORAL_TABLET | Freq: Every day | ORAL | Status: DC

## 2013-03-22 MED ORDER — CLOPIDOGREL BISULFATE 75 MG PO TABS: ORAL_TABLET | ORAL | Status: DC

## 2013-03-22 MED ORDER — ATENOLOL 25 MG PO TABS: 25.0000 mg | ORAL_TABLET | Freq: Every day | ORAL | Status: DC

## 2013-03-22 MED ORDER — NITROGLYCERIN 0.4 MG SL SUBL: 0.4000 mg | SUBLINGUAL_TABLET | SUBLINGUAL | Status: DC | PRN

## 2013-03-22 NOTE — Patient Instructions (Signed)
Your physician has requested that you have a cardiac catheterization. Cardiac catheterization is used to diagnose and/or treat various heart conditions. Doctors may recommend this procedure for a number of different reasons. The most common reason is to evaluate chest pain. Chest pain can be a symptom of coronary artery disease (CAD), and cardiac catheterization can show whether plaque is narrowing or blocking your heart's arteries. This procedure is also used to evaluate the valves, as well as measure the blood flow and oxygen levels in different parts of your heart. For further information please visit [email or web address]. Please follow instruction sheet, as given.  Your physician recommends that you have lab work today: BMP, CBC and PT/INR  Your physician recommends that you continue on your current medications as directed. Please refer to the Current Medication list given to you today.   

## 2013-03-22 NOTE — Progress Notes (Signed)
History of Present Illness: 77 yo male with history of CAD s/p 4V CABG 2003 (LIMA to LAD, SVG to Diagonal, SVG to OM, SVG to PDA), HLD with statin intolerance who is here today for cardiac follow up. He has been followed in the past by Dr. Riley Kill. He had an anterior MI prior ot 1998 at which time a bare metal stent was placed in the LAD. Cardiac cath 2003 with severe stenoses in the Diagonal, OM2 and PDA. He underwent 4V CABG in 2003. 4.0 x 16 mm Liberte mid RCA in 2008. Follow up cath August 07, 2010 with 40-50% proximal LAD stent restenosis with moderate disease in the whole vessel, 50% proximal Circumflex stenosis, 90% OM1, mild disease RCA, SVG to diagonal patent, SVG to OM occluded, SVG to PDA occluded, LIMA to LAD atretic. The OM was treated with a 3. 5 x 20 mm Promus DES.   He is here today for follow up. He has been having chest pains for the last 3-4 weeks. Left side of chest, occurs at rest and with exercise. Some associated SOB. This is very similar to his prior angina.   Primary Care Physician: None  Last Lipid Profile:Lipid Panel     Component Value Date/Time   CHOL 168 09/26/2012 0907   TRIG 118.0 09/26/2012 0907   HDL 31.40* 09/26/2012 0907   CHOLHDL 5 09/26/2012 0907   VLDL 23.6 09/26/2012 0907   LDLCALC 113* 09/26/2012 0907     Past Medical History  Diagnosis Date  . MI (myocardial infarction)   . CAD (coronary artery disease)   . S/P CABG (coronary artery bypass graft)   . Dyslipidemia   . Statin intolerance     Past Surgical History  Procedure Laterality Date  . Coronary artery bypass graft  2003    x4 LIMA to LAD SVG digonal, OM, PDA  . Coronary stent placement  10/05/2006    4.34mmx 16mm Liberte non DES  . Laparoscopic incisional / umbilical / ventral hernia repair  2004  . Bronchogenic cyst resection  08/06/1997    Current Outpatient Prescriptions  Medication Sig Dispense Refill  . amLODipine (NORVASC) 10 MG tablet Take 1 tablet (10 mg total) by mouth  daily.  90 tablet  2  . aspirin 81 MG tablet Take 81 mg by mouth daily.        Marland Kitchen atenolol (TENORMIN) 25 MG tablet Take 1 tablet (25 mg total) by mouth daily.  90 tablet  1  . clopidogrel (PLAVIX) 75 MG tablet TAKE 1 TABLET DAILY  90 tablet  0  . nitroGLYCERIN (NITROSTAT) 0.4 MG SL tablet Place 1 tablet (0.4 mg total) under the tongue every 5 (five) minutes as needed.  25 tablet  3   No current facility-administered medications for this visit.    No Known Allergies  History   Social History  . Marital Status: Married    Spouse Name: N/A    Number of Children: N/A  . Years of Education: N/A   Occupational History  . Not on file.   Social History Main Topics  . Smoking status: Former Games developer  . Smokeless tobacco: Not on file  . Alcohol Use:   . Drug Use:   . Sexual Activity:    Other Topics Concern  . Not on file   Social History Narrative  . No narrative on file    Family History  Problem Relation Age of Onset  . Hypertension Other   . Heart disease Other   .  Kidney cancer Other     Review of Systems:  As stated in the HPI and otherwise negative.   BP 120/72  Pulse 54  Ht 5\' 9"  (1.753 m)  Wt 164 lb (74.39 kg)  BMI 24.21 kg/m2  SpO2 98%  Physical Examination: General: Well developed, well nourished, NAD HEENT: OP clear, mucus membranes moist SKIN: warm, dry. No rashes. Neuro: No focal deficits Musculoskeletal: Muscle strength 5/5 all ext Psychiatric: Mood and affect normal Neck: No JVD, no carotid bruits, no thyromegaly, no lymphadenopathy. Lungs:Clear bilaterally, no wheezes, rhonci, crackles Cardiovascular: Regular rate and rhythm. No murmurs, gallops or rubs. Abdomen:Soft. Bowel sounds present. Non-tender.  Extremities: No lower extremity edema. Pulses are 2 + in the bilateral DP/PT.  Assessment and Plan:   1. CAD: Recent chest pains c/w his prior angina. I do not think a stress test would be helpful with his complex cardiac coronary disease. Will  arrange a cardiac cath next week in the main lab. Risks and benefits reviewed. Pre-cath labs today.

## 2013-03-26 ENCOUNTER — Ambulatory Visit (HOSPITAL_COMMUNITY)
Admission: RE | Admit: 2013-03-26 | Discharge: 2013-03-27 | Disposition: A | Discharge status: Home / Self Care | Payer: BC Managed Care – PPO | Source: Ambulatory Visit | Attending: Cardiovascular Disease | Admitting: Cardiovascular Disease

## 2013-03-26 ENCOUNTER — Encounter (HOSPITAL_COMMUNITY)
Admission: RE | Disposition: A | Discharge status: Home / Self Care | Payer: Self-pay | Source: Ambulatory Visit | Attending: Cardiovascular Disease

## 2013-03-26 ENCOUNTER — Encounter (HOSPITAL_COMMUNITY): Payer: Self-pay | Admitting: General Practice

## 2013-03-26 DIAGNOSIS — I10 Essential (primary) hypertension: Secondary | ICD-10-CM | POA: Diagnosis not present

## 2013-03-26 DIAGNOSIS — I2510 Atherosclerotic heart disease of native coronary artery without angina pectoris: Secondary | ICD-10-CM | POA: Diagnosis not present

## 2013-03-26 DIAGNOSIS — R079 Chest pain, unspecified: Secondary | ICD-10-CM | POA: Diagnosis present

## 2013-03-26 DIAGNOSIS — Z9861 Coronary angioplasty status: Secondary | ICD-10-CM | POA: Insufficient documentation

## 2013-03-26 DIAGNOSIS — I252 Old myocardial infarction: Secondary | ICD-10-CM | POA: Insufficient documentation

## 2013-03-26 DIAGNOSIS — I200 Unstable angina: Secondary | ICD-10-CM | POA: Insufficient documentation

## 2013-03-26 DIAGNOSIS — E7800 Pure hypercholesterolemia, unspecified: Secondary | ICD-10-CM | POA: Diagnosis not present

## 2013-03-26 DIAGNOSIS — I25810 Atherosclerosis of coronary artery bypass graft(s) without angina pectoris: Secondary | ICD-10-CM | POA: Diagnosis not present

## 2013-03-26 HISTORY — DX: Essential (primary) hypertension: I10

## 2013-03-26 HISTORY — PX: LEFT HEART CATHETERIZATION WITH CORONARY/GRAFT ANGIOGRAM: SHX5450

## 2013-03-26 LAB — POCT ACTIVATED CLOTTING TIME: Activated Clotting Time: 411 seconds

## 2013-03-26 SURGERY — LEFT HEART CATHETERIZATION WITH CORONARY/GRAFT ANGIOGRAM
Anesthesia: LOCAL

## 2013-03-26 MED ORDER — NITROGLYCERIN 0.4 MG SL SUBL: 0.4000 mg | SUBLINGUAL_TABLET | SUBLINGUAL | Status: DC | PRN

## 2013-03-26 MED ORDER — MIDAZOLAM HCL 2 MG/2ML IJ SOLN
INTRAMUSCULAR | Status: AC
  Filled 2013-03-26: qty 2

## 2013-03-26 MED ORDER — BIVALIRUDIN 250 MG IV SOLR
INTRAVENOUS | Status: AC
  Filled 2013-03-26: qty 250

## 2013-03-26 MED ORDER — FENTANYL CITRATE 0.05 MG/ML IJ SOLN
INTRAMUSCULAR | Status: AC
  Filled 2013-03-26: qty 2

## 2013-03-26 MED ORDER — CLOPIDOGREL BISULFATE 300 MG PO TABS
ORAL_TABLET | ORAL | Status: AC
  Filled 2013-03-26: qty 1

## 2013-03-26 MED ORDER — DIAZEPAM 5 MG PO TABS
5.0000 mg | ORAL_TABLET | ORAL | Status: AC
  Administered 2013-03-26: 5 mg via ORAL

## 2013-03-26 MED ORDER — SODIUM CHLORIDE 0.9 % IJ SOLN: 3.0000 mL | INTRAMUSCULAR | Status: DC | PRN

## 2013-03-26 MED ORDER — ASPIRIN 81 MG PO CHEW
CHEWABLE_TABLET | ORAL | Status: AC
  Filled 2013-03-26: qty 4

## 2013-03-26 MED ORDER — LIDOCAINE HCL (PF) 1 % IJ SOLN
INTRAMUSCULAR | Status: AC
  Filled 2013-03-26: qty 30

## 2013-03-26 MED ORDER — SODIUM CHLORIDE 0.9 % IJ SOLN: 3.0000 mL | Freq: Two times a day (BID) | INTRAMUSCULAR | Status: DC

## 2013-03-26 MED ORDER — ADENOSINE 12 MG/4ML IV SOLN
12.0000 mL | INTRAVENOUS | Status: AC
  Administered 2013-03-26: 36 mg via INTRAVENOUS
  Filled 2013-03-26: qty 12

## 2013-03-26 MED ORDER — NITROGLYCERIN 0.2 MG/ML ON CALL CATH LAB
INTRAVENOUS | Status: AC
  Filled 2013-03-26: qty 1

## 2013-03-26 MED ORDER — ASPIRIN EC 81 MG PO TBEC
81.0000 mg | DELAYED_RELEASE_TABLET | Freq: Every day | ORAL | Status: DC
  Administered 2013-03-26: 21:00:00 81 mg via ORAL
  Filled 2013-03-26 (×2): qty 1

## 2013-03-26 MED ORDER — ASPIRIN 81 MG PO CHEW
324.0000 mg | CHEWABLE_TABLET | ORAL | Status: AC
  Administered 2013-03-26: 324 mg via ORAL

## 2013-03-26 MED ORDER — AMLODIPINE BESYLATE 10 MG PO TABS
10.0000 mg | ORAL_TABLET | Freq: Every day | ORAL | Status: DC
  Administered 2013-03-26: 10 mg via ORAL
  Filled 2013-03-26 (×2): qty 1

## 2013-03-26 MED ORDER — ATENOLOL 25 MG PO TABS
25.0000 mg | ORAL_TABLET | Freq: Every day | ORAL | Status: DC
  Administered 2013-03-26: 21:00:00 25 mg via ORAL
  Filled 2013-03-26 (×2): qty 1

## 2013-03-26 MED ORDER — ONDANSETRON HCL 4 MG/2ML IJ SOLN: 4.0000 mg | Freq: Four times a day (QID) | INTRAMUSCULAR | Status: DC | PRN

## 2013-03-26 MED ORDER — CLOPIDOGREL BISULFATE 75 MG PO TABS: 75.0000 mg | ORAL_TABLET | Freq: Every day | ORAL | Status: DC

## 2013-03-26 MED ORDER — ACETAMINOPHEN 325 MG PO TABS: 650.0000 mg | ORAL_TABLET | ORAL | Status: DC | PRN

## 2013-03-26 MED ORDER — SODIUM CHLORIDE 0.9 % IV SOLN: INTRAVENOUS | Status: AC

## 2013-03-26 MED ORDER — SODIUM CHLORIDE 0.9 % IV SOLN
INTRAVENOUS | Status: DC
  Administered 2013-03-26: 06:00:00 via INTRAVENOUS

## 2013-03-26 MED ORDER — SODIUM CHLORIDE 0.9 % IV SOLN: 250.0000 mL | INTRAVENOUS | Status: DC | PRN

## 2013-03-26 MED ORDER — HEPARIN (PORCINE) IN NACL 2-0.9 UNIT/ML-% IJ SOLN
INTRAMUSCULAR | Status: AC
  Filled 2013-03-26: qty 1000

## 2013-03-26 MED ORDER — DIAZEPAM 5 MG PO TABS
ORAL_TABLET | ORAL | Status: AC
  Filled 2013-03-26: qty 1

## 2013-03-26 NOTE — CV Procedure (Signed)
Cardiac Catheterization Operative Report  CASHTYN POULIOT [id number] 8/18/20148:42 AM No primary provider on file.  Procedure Performed:  1. Left Heart Catheterization 2. Selective Coronary Angiography 3. Left ventricular angiogram 4. PTCA/DES x 1 OM1 5. FFR of LAD 6. Angioseal right femoral artery  Operator: Verne Carrow, MD  Indication:    77 yo male with history of CAD s/p 4V CABG with recent chest pains with minimal exertion c/w unstable angina (class III). He has undergone CABG in 2003. Last cath December 2011 with 1/4 patent grafts (SVG to Diagonal) and he had DES placed prox Circ into OM1 at that time.                                 Procedure Details: The risks, benefits, complications, treatment options, and expected outcomes were discussed with the patient. The patient and/or family concurred with the proposed plan, giving informed consent. The patient was brought to the cath lab after IV hydration was begun and oral premedication was given. The patient was further sedated with Versed and Fentanyl. The right groin was prepped and draped in the usual manner. Using the modified Seldinger access technique, a 5 French sheath was placed in the right femoral artery. Standard diagnostic catheters were used to perform selective coronary angiography. A pigtail catheter was used to perform a left ventricular angiogram. He was found to have a severe stenosis in the mid portion of OM1.   PCI Note: He was given a bolus of Angiomax and a drip was started. I engaged the the left main with a XB 3.0 guiding catheter. When the ACT was over 200, I passed a BMW wire down into the OM branch. A 2.0 x 12 mm balloon was used to pre-dilate the stenosis. I then carefully positioned and deployed a 3.0 x 32 mm Promus Premier DES in the OM branch overlapping the old stent. I then post-dilated the stent with a 3.25 x 20 mm [place] balloon x 2. The stenosis was taken from 85% down to 0%.   I then turned my  attention to the LAD. The proximal stented segment had evidence of at least moderate restenosis. This likely represented a 60-70% stenosis but was only minimally changed from last cath in 2011. An FFR of this vessel was 0.90 after infusion of IV adenosine for 120 seconds. This suggested that the lesion is not flow limiting.   There were no immediate complications. Plavix 300 mg po x 1 during case. An Angioseal femoral artery closure device was placed in the right femoral artery. The patient was taken to the recovery area in stable condition.   Hemodynamic Findings: Central aortic pressure: 118/59 Left ventricular pressure: 121/8/12  Angiographic Findings:  Left main: No obstructive disease.   Left Anterior Descending Artery: Moderate to large caliber vessel that courses to the apex. The proximal to mid vessel stented segment with 60-70% restenosis, grossly unchanged from last cath in 2011. The distal vessel has diffuse mild stenosis. The diagonal branch is occluded but fills from the patent vein graft.   Circumflex Artery: Large caliber vessel with patent stent in proximal vessel extending into OM1. The first OM branch has a hazy, 85-90% stenosis distal to the stent. The AV groove Circumflex is small in caliber and has mild plaque disease.   Right Coronary Artery: Large dominant vessel with 40% mid stenosis, patent mid stent with 30% restenosis, mild distal disease.   Graft  Anatomy:  SVG to Diagonal is patent  SVG to OM known to be occluded and not injected SVG to PDA known to be occluded and not injected LIMA to mid LAD is known to be atretic and not injected  Left Ventricular Angiogram: LVEF=55%. 1+ MR  Impression: 1. Triple vessel CAD s/p CABG with 1/4 patent grafts, patent stents LAD and RCA.  2.  Unstable angina (class III) secondary to severe stenosis OM1 3.  Successful PTCA/DES x 1 OM1 4. Moderate disease stented segment of proximal LAD, FFR of 0.90 suggesting this lesion is not  flow limiting.  5. Preserved LV systolic function  Recommendations: He will need continued dual anti-platelet therapy with ASA and Plavix for at least one year. Continue other cardiac meds.        Complications:  None. The patient tolerated the procedure well.

## 2013-03-26 NOTE — Interval H&P Note (Signed)
History and Physical Interval Note:  03/26/2013 7:36 AM  Steve Gordon  has presented today for cardiac cath with the diagnosis of CAD/chest pain. The various methods of treatment have been discussed with the patient and family. After consideration of risks, benefits and other options for treatment, the patient has consented to  Procedure(s): LEFT HEART CATHETERIZATION WITH CORONARY/GRAFT ANGIOGRAM (N/A) as a surgical intervention .  The patient's history has been reviewed, patient examined, no change in status, stable for surgery.  I have reviewed the patient's chart and labs.  Questions were answered to the patient's satisfaction.    Cath Lab Visit (complete for each Cath Lab visit)  Clinical Evaluation Leading to the Procedure:   ACS: no  Non-ACS:    Anginal Classification: CCS III  Anti-ischemic medical therapy: Maximal Therapy (2 or more classes of medications)  Non-Invasive Test Results: No non-invasive testing performed  Prior CABG: Previous CABG        MCALHANY,CHRISTOPHER

## 2013-03-26 NOTE — H&P (View-Only) (Signed)
 History of Present Illness: 77 yo male with history of CAD s/p 4V CABG 2003 (LIMA to LAD, SVG to Diagonal, SVG to OM, SVG to PDA), HLD with statin intolerance who is here today for cardiac follow up. He has been followed in the past by Dr. Stuckey. He had an anterior MI prior ot 1998 at which time a bare metal stent was placed in the LAD. Cardiac cath 2003 with severe stenoses in the Diagonal, OM2 and PDA. He underwent 4V CABG in 2003. 4.0 x 16 mm Liberte mid RCA in 2008. Follow up cath August 07, 2010 with 40-50% proximal LAD stent restenosis with moderate disease in the whole vessel, 50% proximal Circumflex stenosis, 90% OM1, mild disease RCA, SVG to diagonal patent, SVG to OM occluded, SVG to PDA occluded, LIMA to LAD atretic. The OM was treated with a 3. 5 x 20 mm Promus DES.   He is here today for follow up. He has been having chest pains for the last 3-4 weeks. Left side of chest, occurs at rest and with exercise. Some associated SOB. This is very similar to his prior angina.   Primary Care Physician: None  Last Lipid Profile:Lipid Panel     Component Value Date/Time   CHOL 168 09/26/2012 0907   TRIG 118.0 09/26/2012 0907   HDL 31.40* 09/26/2012 0907   CHOLHDL 5 09/26/2012 0907   VLDL 23.6 09/26/2012 0907   LDLCALC 113* 09/26/2012 0907     Past Medical History  Diagnosis Date  . MI (myocardial infarction)   . CAD (coronary artery disease)   . S/P CABG (coronary artery bypass graft)   . Dyslipidemia   . Statin intolerance     Past Surgical History  Procedure Laterality Date  . Coronary artery bypass graft  2003    x4 LIMA to LAD SVG digonal, OM, PDA  . Coronary stent placement  10/05/2006    4.0mmx 16mm Liberte non DES  . Laparoscopic incisional / umbilical / ventral hernia repair  2004  . Bronchogenic cyst resection  08/06/1997    Current Outpatient Prescriptions  Medication Sig Dispense Refill  . amLODipine (NORVASC) 10 MG tablet Take 1 tablet (10 mg total) by mouth  daily.  90 tablet  2  . aspirin 81 MG tablet Take 81 mg by mouth daily.        . atenolol (TENORMIN) 25 MG tablet Take 1 tablet (25 mg total) by mouth daily.  90 tablet  1  . clopidogrel (PLAVIX) 75 MG tablet TAKE 1 TABLET DAILY  90 tablet  0  . nitroGLYCERIN (NITROSTAT) 0.4 MG SL tablet Place 1 tablet (0.4 mg total) under the tongue every 5 (five) minutes as needed.  25 tablet  3   No current facility-administered medications for this visit.    No Known Allergies  History   Social History  . Marital Status: Married    Spouse Name: N/A    Number of Children: N/A  . Years of Education: N/A   Occupational History  . Not on file.   Social History Main Topics  . Smoking status: Former Smoker  . Smokeless tobacco: Not on file  . Alcohol Use:   . Drug Use:   . Sexual Activity:    Other Topics Concern  . Not on file   Social History Narrative  . No narrative on file    Family History  Problem Relation Age of Onset  . Hypertension Other   . Heart disease Other   .   Kidney cancer Other     Review of Systems:  As stated in the HPI and otherwise negative.   BP 120/72  Pulse 54  Ht 5' 9" (1.753 m)  Wt 164 lb (74.39 kg)  BMI 24.21 kg/m2  SpO2 98%  Physical Examination: General: Well developed, well nourished, NAD HEENT: OP clear, mucus membranes moist SKIN: warm, dry. No rashes. Neuro: No focal deficits Musculoskeletal: Muscle strength 5/5 all ext Psychiatric: Mood and affect normal Neck: No JVD, no carotid bruits, no thyromegaly, no lymphadenopathy. Lungs:Clear bilaterally, no wheezes, rhonci, crackles Cardiovascular: Regular rate and rhythm. No murmurs, gallops or rubs. Abdomen:Soft. Bowel sounds present. Non-tender.  Extremities: No lower extremity edema. Pulses are 2 + in the bilateral DP/PT.  Assessment and Plan:   1. CAD: Recent chest pains c/w his prior angina. I do not think a stress test would be helpful with his complex cardiac coronary disease. Will  arrange a cardiac cath next week in the main lab. Risks and benefits reviewed. Pre-cath labs today.  

## 2013-03-26 NOTE — Progress Notes (Signed)
Utilization Review Completed.   Lorenia Hoston, RN, BSN Nurse Case Manager  [phone]  

## 2013-03-27 ENCOUNTER — Encounter (HOSPITAL_COMMUNITY): Payer: Self-pay | Admitting: Nurse Practitioner

## 2013-03-27 DIAGNOSIS — I2510 Atherosclerotic heart disease of native coronary artery without angina pectoris: Secondary | ICD-10-CM | POA: Diagnosis not present

## 2013-03-27 DIAGNOSIS — I252 Old myocardial infarction: Secondary | ICD-10-CM | POA: Diagnosis not present

## 2013-03-27 DIAGNOSIS — I200 Unstable angina: Secondary | ICD-10-CM | POA: Diagnosis present

## 2013-03-27 DIAGNOSIS — I25810 Atherosclerosis of coronary artery bypass graft(s) without angina pectoris: Secondary | ICD-10-CM | POA: Diagnosis not present

## 2013-03-27 LAB — CBC
Hemoglobin: 12.9 g/dL — ABNORMAL LOW (ref 13.0–17.0)
MCH: 32.9 pg (ref 26.0–34.0)
MCHC: 34.6 g/dL (ref 30.0–36.0)
MCV: 95.2 fL (ref 78.0–100.0)
Platelets: 142 10*3/uL — ABNORMAL LOW (ref 150–400)
RBC: 3.92 MIL/uL — ABNORMAL LOW (ref 4.22–5.81)

## 2013-03-27 LAB — BASIC METABOLIC PANEL
CO2: 25 mEq/L (ref 19–32)
Calcium: 8.9 mg/dL (ref 8.4–10.5)
Creatinine, Ser: 1.22 mg/dL (ref 0.50–1.35)
GFR calc non Af Amer: 56 mL/min — ABNORMAL LOW (ref >90)
Glucose, Bld: 98 mg/dL (ref 70–99)

## 2013-03-27 MED FILL — Sodium Chloride IV Soln 0.9%: INTRAVENOUS | Qty: 50 | Status: AC

## 2013-03-27 NOTE — Discharge Summary (Signed)
Patient ID: Steve Gordon,  MRN: [id number], DOB/AGE: 14-May-1936 77 y.o.  Admit date: 03/26/2013 Discharge date: 03/27/2013  Primary Cardiologist: C. McAlhany, MD  Discharge Diagnoses Principal Problem:   Unstable angina  **s/p PCI and DES of the OM1 this admission.  **Normal FFR of mid LAD dzs -> Med Rx. Active Problems:   CAD, ARTERY BYPASS GRAFT   Coronary artery disease   HYPERCHOLESTEROLEMIA  IIA  **Statin intolerant   HYPERTENSION, BENIGN  Allergies No Known Allergies  Procedures  Cardiac Catheterization and Percutaneous Coronary Intervention 8.18.2014  Hemodynamic Findings: Central aortic pressure: 118/59 Left ventricular pressure: 121/8/12  Angiographic Findings:  Left main: No obstructive disease.   Left Anterior Descending Artery: Moderate to large caliber vessel that courses to the apex. The proximal to mid vessel stented segment with 60-70% restenosis, grossly unchanged from last cath in 2011. The distal vessel has diffuse mild stenosis. The diagonal branch is occluded but fills from the patent vein graft.     ** Fractional Flow Reserve of this vessel was 0.90 after infusion of IV adenosine for 120 seconds. This suggested that the lesion is not flow limiting.**  Circumflex Artery: Large caliber vessel with patent stent in proximal vessel extending into OM1. The first OM branch has a hazy, 85-90% stenosis distal to the stent. The AV groove Circumflex is small in caliber and has mild plaque disease.     **The OM1 was successfully treated with a 3.0 x 32 mm Promus Premier DES, overlapping the old stent.**  Right Coronary Artery: Large dominant vessel with 40% mid stenosis, patent mid stent with 30% restenosis, mild distal disease.   Graft Anatomy:  SVG to Diagonal is patent   SVG to OM known to be occluded and not injected SVG to PDA known to be occluded and not injected LIMA to mid LAD is known to be atretic and not injected  Left Ventricular Angiogram:  LVEF=55%. 1+ MR  Impression: 1. Triple vessel CAD s/p CABG with 1/4 patent grafts, patent stents LAD and RCA.   2.  Unstable angina (class III) secondary to severe stenosis OM1 3.  Successful PTCA/DES x 1 OM1 4. Moderate disease stented segment of proximal LAD, FFR of 0.90 suggesting this lesion is not flow limiting.   5. Preserved LV systolic function _____________   History of Present Illness  77 y/o male with h/o CAD s/p CABG x 4 in 2003, with known occlusions of two of his vein grafts and known atresia of the LIMA graft.  He was recently seen in clinic with complaints of recurrent rest and exertional chest discomfort.  Decision was made to pursue diagnostic catheterization.  Hospital Course  Patient presented to the [facility] Cath Lab and underwent diagnostic cardiac catheterization revealing moderate LAD disease and a new stenosis within the OM1.  The OM1 was successfully treated with a Promus Premier DES.  Fractional flow reserved was performed within the LAD stenosis and was normal @ 0.90, thus no intervention was required.  Post-PCI, Steve Gordon has had no recurrence of chest pain and has been ambulating without difficulty.  He will be discharged home today in good condition.  Discharge Vitals Blood pressure 136/80, pulse 53, temperature 97.6 F (36.4 C), temperature source Oral, resp. rate 16, height 5\' 9"  (1.753 m), weight 164 lb 7.4 oz (74.6 kg), SpO2 100.00%.  Filed Weights   03/26/13 0542 03/27/13 0529  Weight: 164 lb (74.39 kg) 164 lb 7.4 oz (74.6 kg)   Labs  CBC  Recent Labs  03/27/13 0500  WBC 5.9  HGB 12.9*  HCT 37.3*  MCV 95.2  PLT 142*   Basic Metabolic Panel  Recent Labs  03/27/13 0500  NA 142  K 4.4  CL 109  CO2 25  GLUCOSE 98  BUN 21  CREATININE 1.22  CALCIUM 8.9   Disposition  Pt is being discharged home today in good condition.  Follow-up Plans & Appointments      Follow-up Information   Follow up with Tereso Newcomer, PA-C On 04/13/2013.  (10:30 AM)    Specialty:  Physician Assistant   Contact information:   [address] [place] [address] [phone]       Discharge Medications    Medication List         amLODipine 10 MG tablet  Commonly known as:  NORVASC  Take 10 mg by mouth at bedtime.     aspirin EC 81 MG tablet  Take 81 mg by mouth at bedtime.     atenolol 25 MG tablet  Commonly known as:  TENORMIN  Take 25 mg by mouth at bedtime.     clopidogrel 75 MG tablet  Commonly known as:  PLAVIX  Take 75 mg by mouth at bedtime.     nitroGLYCERIN 0.4 MG SL tablet  Commonly known as:  NITROSTAT  Place 0.4 mg under the tongue every 5 (five) minutes as needed for chest pain.      Outstanding Labs/Studies  None  Duration of Discharge Encounter   Greater than 30 minutes including physician time.  Signed, Nicolasa Ducking NP 03/27/2013, 1:03 PM

## 2013-03-27 NOTE — Progress Notes (Signed)
    SUBJECTIVE: No chest pain or SOB.   BP 141/78  Pulse 50  Temp(Src) 98.1 F (36.7 C) (Oral)  Resp 16  Ht 5\' 9"  (1.753 m)  Wt 164 lb 7.4 oz (74.6 kg)  BMI 24.28 kg/m2  SpO2 100%  Intake/Output Summary (Last 24 hours) at 03/27/13 4098 Last data filed at 03/27/13 0200  Gross per 24 hour  Intake    480 ml  Output    375 ml  Net    105 ml    PHYSICAL EXAM General: Well developed, well nourished, in no acute distress. Alert and oriented x 3.  Psych:  Good affect, responds appropriately Neck: No JVD. No masses noted.  Lungs: Clear bilaterally with no wheezes or rhonci noted.  Heart: RRR with no murmurs noted. Abdomen: Bowel sounds are present. Soft, non-tender.  Extremities: No lower extremity edema. Right groin without hematoma.   LABS: Basic Metabolic Panel:  Recent Labs  11/91/47 0500  NA 142  K 4.4  CL 109  CO2 25  GLUCOSE 98  BUN 21  CREATININE 1.22  CALCIUM 8.9   CBC:  Recent Labs  03/27/13 0500  WBC 5.9  HGB 12.9*  HCT 37.3*  MCV 95.2  PLT 142*   Current Meds: . amLODipine  10 mg Oral QHS  . aspirin EC  81 mg Oral QHS  . atenolol  25 mg Oral QHS  . clopidogrel  75 mg Oral QHS    Cardiac cath: 03/26/13: Left main: No obstructive disease.  Left Anterior Descending Artery: Moderate to large caliber vessel that courses to the apex. The proximal to mid vessel stented segment with 60-70% restenosis, grossly unchanged from last cath in 2011. The distal vessel has diffuse mild stenosis. The diagonal branch is occluded but fills from the patent vein graft.  Circumflex Artery: Large caliber vessel with patent stent in proximal vessel extending into OM1. The first OM branch has a hazy, 85-90% stenosis distal to the stent. The AV groove Circumflex is small in caliber and has mild plaque disease.  Right Coronary Artery: Large dominant vessel with 40% mid stenosis, patent mid stent with 30% restenosis, mild distal disease.  Graft Anatomy:  SVG to Diagonal is  patent  SVG to OM known to be occluded and not injected  SVG to PDA known to be occluded and not injected  LIMA to mid LAD is known to be atretic and not injected  Left Ventricular Angiogram: LVEF=55%. 1+ MR  ASSESSMENT AND PLAN:  1. Unstable angina: Pt admitted yesterday after PCI. Recent chest pains c/w unstable angina. Cath with severe stenosis OM1, now s/p DES x 1 in the OM1. Moderate disease proximal LAD stented segment. FFR of 0.90 suggesting this lesion is not flow limiting. Will need continued ASA and Plavix for at least one year. Continue beta blocker and amlodipine. He is intolerant of all statins.   2. Dispo: d/c home today. He can f/u with me, Tereso Newcomer or Norma Fredrickson in 3-4 weeks.   MCALHANY,CHRISTOPHER  8/19/20146:42 AM

## 2013-03-27 NOTE — Progress Notes (Signed)
[phone] Pt had already walked independently so I did not walk with him. Education completed with pt and wife. Pt has attended CRP 2 before and did not want to repeat. Encouraged pt to try stationary bike or water aerobics as he said he could not walk long distances with his ankle difficulties.  No CP with walks he stated. Luetta Nutting RNBSN

## 2013-03-28 NOTE — Discharge Summary (Signed)
See full progress note. cdm

## 2013-04-12 ENCOUNTER — Encounter: Payer: Self-pay | Admitting: *Deleted

## 2013-04-13 ENCOUNTER — Ambulatory Visit (INDEPENDENT_AMBULATORY_CARE_PROVIDER_SITE_OTHER): Payer: BC Managed Care – PPO | Admitting: Physician Assistant

## 2013-04-13 ENCOUNTER — Encounter: Payer: Self-pay | Admitting: Physician Assistant

## 2013-04-13 VITALS — BP 127/72 | HR 47 | Ht 69.0 in | Wt 160.0 lb

## 2013-04-13 DIAGNOSIS — E785 Hyperlipidemia, unspecified: Secondary | ICD-10-CM

## 2013-04-13 DIAGNOSIS — I25810 Atherosclerosis of coronary artery bypass graft(s) without angina pectoris: Secondary | ICD-10-CM

## 2013-04-13 DIAGNOSIS — I10 Essential (primary) hypertension: Secondary | ICD-10-CM

## 2013-04-13 NOTE — Progress Notes (Signed)
[address] [place], [place]  [address] Phone: [phone] Fax:  [phone]  Date:  04/13/2013   ID:  Steve Gordon, DOB 04/26/1936, MRN [id number]  PCP:  No primary provider on file.  Cardiologist:  Dr.  Shawnie Pons => Dr. Verne Carrow     History of Present Illness: Steve Gordon is a 77 y.o. male who returns for follow up after a recent admission to the hospital for [place].  He has a hx of CAD s/p 4V CABG 2003 (LIMA to LAD, SVG to Diagonal, SVG to OM, SVG to PDA), HL with statin intolerance.  He had an anterior MI prior to 1998 at which time a bare metal stent was placed in the LAD. Cardiac cath in 2003 with severe stenoses in the Dx, OM2 and PDA. He underwent 4V CABG in 2003. He underwent placement of a 4.0 x 16 mm Liberte BMS to the mid RCA in 2008. Follow up cath August 07, 2010 with 40-50% proximal LAD ISR with moderate disease in the whole vessel, 50% proximal CFX stenosis, 90% OM1, mild disease RCA, SVG to diagonal patent, SVG to OM occluded, SVG to PDA occluded, LIMA to LAD atretic. The OM was treated with a  3.5 x 20 mm Promus DES.   He was recently seen by Dr. Verne Carrow with CCS Class III angina.  LHC 03/26/13:  prox to mid LAD 60-70 (unchanged), Dx occluded, OM1 85-90 dist to stent, mRCA 40, mRCA stent patent with 30% ISR, S-Dx ok, S-OM known to be occluded, S-PDA known to be occluded, L-LAD known to be atretic, EF 55%.  FFR of LAD: 0.90 (not flow limiting).  PCI:  3x32 mm Promus premier DES to OM1 overlapping old stent.  Post PCI course uneventful.    He is feeling much better.  The patient denies chest pain, shortness of breath, syncope, orthopnea, PND or significant pedal edema.   Labs (2/14):  LDL 113 Labs (8/14):  K 4.4, Cr 1.22, Hgb 12.9,   Wt Readings from Last 3 Encounters:  03/27/13 164 lb 7.4 oz (74.6 kg)  03/27/13 164 lb 7.4 oz (74.6 kg)  03/22/13 164 lb (74.39 kg)     Past Medical History  Diagnosis Date  . CAD  (coronary artery disease)     a. Ant MI 1998;  b. 2003: s/p CABG x 4 (VG->Diag, VG->OM, VG->PDA, LIMA->LAD);  c. PCI 2008: RCA - 4.0x16 Liberte BMS;  d. 07/2010 PCI: OM1- 3.5x20 Promus DES;  e. 03/2013 Cath/PCI: LM nl, LAD 60-70p/m (FFR 0.9->Med Rx), Diag 100, LCX patent prox stent, OM1 95-90(3.0x32 Promus Premier DES), RCA dom, 57m, 30 ISR, VG->Diag ok, VG->OM KTBO, VG->PDA KTBO, LIMA->LAD known atresia, EF 55%  . Dyslipidemia   . Statin intolerance   . Hypertension     Current Outpatient Prescriptions  Medication Sig Dispense Refill  . amLODipine (NORVASC) 10 MG tablet Take 10 mg by mouth at bedtime.      Marland Kitchen aspirin EC 81 MG tablet Take 81 mg by mouth at bedtime.      Marland Kitchen atenolol (TENORMIN) 25 MG tablet Take 25 mg by mouth at bedtime.      . clopidogrel (PLAVIX) 75 MG tablet Take 75 mg by mouth at bedtime.      . nitroGLYCERIN (NITROSTAT) 0.4 MG SL tablet Place 0.4 mg under the tongue every 5 (five) minutes as needed for chest pain.       No current facility-administered medications for this visit.  Allergies:   No Known Allergies  Social History:  The patient  reports that he has never smoked. He has never used smokeless tobacco. He reports that  drinks alcohol. He reports that he does not use illicit drugs.   ROS:  Please see the history of present illness.      All other systems reviewed and negative.   PHYSICAL EXAM: VS:  BP 127/72  Pulse 47  Ht 5\' 9"  (1.753 m)  Wt 160 lb (72.576 kg)  BMI 23.62 kg/m2 Well nourished, well developed, in no acute distress HEENT: normal Neck: no JVD Cardiac:  normal S1, S2; RRR; no murmur Lungs:  clear to auscultation bilaterally, no wheezing, rhonchi or rales Abd: soft, nontender, no hepatomegaly Ext: no edema; right groin without hematoma or bruit  Skin: warm and dry Neuro:  CNs 2-12 intact, no focal abnormalities noted  EKG:  Sinus brady, HR 47, normal axis, no changes     ASSESSMENT AND PLAN:  1. CAD:  Doing well post PCI to the OM1.   He will remain on dual antiplatelet Rx.  2. Hypertension:  Controlled.  Continue current therapy.  3. Hyperlipidemia:  He is intolerant to statins. 4. Disposition:  F/u with Dr. Verne Carrow in 3 mos.   Signed, Tereso Newcomer, PA-C  04/13/2013 10:22 AM

## 2013-04-13 NOTE — Patient Instructions (Addendum)
NO CHANGES WERE MADE TODAY  YOU HAVE A FOLLOW UP WITH DR. Clifton James 07/17/13 @ 10:30

## 2013-07-17 ENCOUNTER — Ambulatory Visit (INDEPENDENT_AMBULATORY_CARE_PROVIDER_SITE_OTHER): Payer: BC Managed Care – PPO | Admitting: Cardiovascular Disease

## 2013-07-17 ENCOUNTER — Encounter (INDEPENDENT_AMBULATORY_CARE_PROVIDER_SITE_OTHER): Payer: Self-pay

## 2013-07-17 ENCOUNTER — Encounter: Payer: Self-pay | Admitting: Cardiovascular Disease

## 2013-07-17 VITALS — BP 149/66 | HR 52 | Ht 69.0 in | Wt 163.0 lb

## 2013-07-17 DIAGNOSIS — I10 Essential (primary) hypertension: Secondary | ICD-10-CM

## 2013-07-17 DIAGNOSIS — I2510 Atherosclerotic heart disease of native coronary artery without angina pectoris: Secondary | ICD-10-CM

## 2013-07-17 DIAGNOSIS — E785 Hyperlipidemia, unspecified: Secondary | ICD-10-CM

## 2013-07-17 NOTE — Patient Instructions (Signed)
Your physician wants you to follow-up in:  6 months. You will receive a reminder letter in the mail two months in advance. If you don't receive a letter, please call our office to schedule the follow-up appointment.   

## 2013-07-17 NOTE — Progress Notes (Signed)
History of Present Illness: 77 yo male with history of CAD s/p 4V CABG 2003 (LIMA to LAD, SVG to Diagonal, SVG to OM, SVG to PDA), HLD with statin intolerance who is here today for cardiac follow up. He has been followed in the past by Dr. Riley Kill. He had an anterior MI prior ot 1998 at which time a bare metal stent was placed in the LAD. Cardiac cath 2003 with severe stenoses in the Diagonal, OM2 and PDA. He underwent 4V CABG in 2003. 4.0 x 16 mm Liberte mid RCA in 2008. Follow up cath August 07, 2010 with 40-50% proximal LAD stent restenosis with moderate disease in the whole vessel, 50% proximal Circumflex stenosis, 90% OM1, mild disease RCA, SVG to diagonal patent, SVG to OM occluded, SVG to PDA occluded, LIMA to LAD atretic. The OM was treated with a 3. 5 x 20 mm Promus DES. I saw him in August 2014 and he had chest pain c/w unstable angina. Repeat cath [facility] 03/26/13: prox to mid LAD 60-70 (unchanged), Dx occluded, OM1 85-90 dist to stent, mRCA 40, mRCA stent patent with 30% ISR, SVG to Diagonal patent, SVG to OM known to be occluded, SVG to PDA known to be occluded, LIMA to LAD known to be atretic, EF 55%. FFR of LAD: 0.90 (not flow limiting). PCI: 3x32 mm Promus premier DES to OM1 overlapping old stent. Post PCI course uneventful.   He is here today for follow up. He has been doing well. No chest pains or SOB. BP has been 120-125 systolic at home.   Primary Care Physician: None  Last Lipid Profile:Lipid Panel     Component Value Date/Time   CHOL 168 09/26/2012 0907   TRIG 118.0 09/26/2012 0907   HDL 31.40* 09/26/2012 0907   CHOLHDL 5 09/26/2012 0907   VLDL 23.6 09/26/2012 0907   LDLCALC 113* 09/26/2012 0907     Past Medical History  Diagnosis Date  . CAD (coronary artery disease)     a. Ant MI 1998;  b. 2003: s/p CABG x 4 (VG->Diag, VG->OM, VG->PDA, LIMA->LAD);  c. PCI 2008: RCA - 4.0x16 Liberte BMS;  d. 07/2010 PCI: OM1- 3.5x20 Promus DES;  e. 03/2013 Cath/PCI: LM nl, LAD 60-70p/m (FFR  0.9->Med Rx), Diag 100, LCX patent prox stent, OM1 95-90(3.0x32 Promus Premier DES), RCA dom, 32m, 30 ISR, VG->Diag ok, VG->OM KTBO, VG->PDA KTBO, LIMA->LAD known atresia, EF 55%  . Dyslipidemia   . Statin intolerance   . Hypertension     Past Surgical History  Procedure Laterality Date  . Laparoscopic incisional / umbilical / ventral hernia repair  2004  . Bronchogenic cyst resection  08/06/1997  . Inguinal hernia repair Right     "3 times" (03/26/2013)  . Cholecystectomy    . Ankle fracture surgery Left ~ 1999    "broke in 1993; casted; PCP said not broken; had MI; walked for rehab; wore ankle out; put in plates and screws" (03/26/2013)  . Coronary artery bypass graft  2003    x4 LIMA to LAD SVG digonal, OM, PDA  . Coronary angioplasty with stent placement  1998-03/26/2013    "2 at one time in 1998; this one  today is the 3rd one I've had since OHS in 2003" (03/26/2013)  . Coronary angioplasty with stent placement  10/05/2006    4.86mmx 16mm Liberte non DES    Current Outpatient Prescriptions  Medication Sig Dispense Refill  . amLODipine (NORVASC) 10 MG tablet Take 10 mg by mouth at  bedtime.      Marland Kitchen aspirin EC 81 MG tablet Take 81 mg by mouth at bedtime.      Marland Kitchen atenolol (TENORMIN) 25 MG tablet Take 25 mg by mouth at bedtime.      . clopidogrel (PLAVIX) 75 MG tablet Take 75 mg by mouth at bedtime.      . nitroGLYCERIN (NITROSTAT) 0.4 MG SL tablet Place 0.4 mg under the tongue every 5 (five) minutes as needed for chest pain.       No current facility-administered medications for this visit.    No Known Allergies  History   Social History  . Marital Status: Married    Spouse Name: N/A    Number of Children: N/A  . Years of Education: N/A   Occupational History  . Not on file.   Social History Main Topics  . Smoking status: Never Smoker   . Smokeless tobacco: Never Used  . Alcohol Use: Yes     Comment: 03/26/2013 "couple shots some weeks; none for 2 months; just take a drink  q now and then"  . Drug Use: No  . Sexual Activity: Yes   Other Topics Concern  . Not on file   Social History Narrative  . No narrative on file    Family History  Problem Relation Age of Onset  . Hypertension Other   . Heart disease Other   . Kidney cancer Other     Review of Systems:  As stated in the HPI and otherwise negative.   BP 149/66  Pulse 52  Ht 5\' 9"  (1.753 m)  Wt 163 lb (73.936 kg)  BMI 24.06 kg/m2  Physical Examination: General: Well developed, well nourished, NAD HEENT: OP clear, mucus membranes moist SKIN: warm, dry. No rashes. Neuro: No focal deficits Musculoskeletal: Muscle strength 5/5 all ext Psychiatric: Mood and affect normal Neck: No JVD, no carotid bruits, no thyromegaly, no lymphadenopathy. Lungs:Clear bilaterally, no wheezes, rhonci, crackles Cardiovascular: Regular rate and rhythm. No murmurs, gallops or rubs. Abdomen:Soft. Bowel sounds present. Non-tender.  Extremities: No lower extremity edema. Pulses are 2 + in the bilateral DP/PT.  Cardiac cath 03/26/13: PCI Note: He was given a bolus of Angiomax and a drip was started. I engaged the the left main with a XB 3.0 guiding catheter. When the ACT was over 200, I passed a BMW wire down into the OM branch. A 2.0 x 12 mm balloon was used to pre-dilate the stenosis. I then carefully positioned and deployed a 3.0 x 32 mm Promus Premier DES in the OM branch overlapping the old stent. I then post-dilated the stent with a 3.25 x 20 mm Romeo balloon x 2. The stenosis was taken from 85% down to 0%.  I then turned my attention to the LAD. The proximal stented segment had evidence of at least moderate restenosis. This likely represented a 60-70% stenosis but was only minimally changed from last cath in 2011. An FFR of this vessel was 0.90 after infusion of IV adenosine for 120 seconds. This suggested that the lesion is not flow limiting.  There were no immediate complications. Plavix 300 mg po x 1 during case. An  Angioseal femoral artery closure device was placed in the right femoral artery. The patient was taken to the recovery area in stable condition.  Hemodynamic Findings:  Central aortic pressure: 118/59  Left ventricular pressure: 121/8/12  Angiographic Findings:  Left main: No obstructive disease.  Left Anterior Descending Artery: Moderate to large caliber vessel that  courses to the apex. The proximal to mid vessel stented segment with 60-70% restenosis, grossly unchanged from last cath in 2011. The distal vessel has diffuse mild stenosis. The diagonal branch is occluded but fills from the patent vein graft.  Circumflex Artery: Large caliber vessel with patent stent in proximal vessel extending into OM1. The first OM branch has a hazy, 85-90% stenosis distal to the stent. The AV groove Circumflex is small in caliber and has mild plaque disease.  Right Coronary Artery: Large dominant vessel with 40% mid stenosis, patent mid stent with 30% restenosis, mild distal disease.  Graft Anatomy:  SVG to Diagonal is patent  SVG to OM known to be occluded and not injected  SVG to PDA known to be occluded and not injected  LIMA to mid LAD is known to be atretic and not injected  Left Ventricular Angiogram: LVEF=55%. 1+ MR  Impression:  1. Triple vessel CAD s/p CABG with 1/4 patent grafts, patent stents LAD and RCA.  2. Unstable angina (class III) secondary to severe stenosis OM1  3. Successful PTCA/DES x 1 OM1  4. Moderate disease stented segment of proximal LAD, FFR of 0.90 suggesting this lesion is not flow limiting.  5. Preserved LV systolic function  Assessment and Plan:   1. CAD: Stable after recent PCI of OM August 2014. Continue medical therapy including ASA, Plavix, beta blocker, Norvasc.    2. HTN: BP controlled at home.   3. HLD: He is intolerant of statins.

## 2014-02-02 ENCOUNTER — Other Ambulatory Visit: Payer: Self-pay | Admitting: Cardiovascular Disease

## 2014-02-04 ENCOUNTER — Other Ambulatory Visit: Payer: Self-pay | Admitting: Cardiovascular Disease

## 2014-02-06 ENCOUNTER — Encounter: Payer: Self-pay | Admitting: Cardiovascular Disease

## 2014-02-06 ENCOUNTER — Ambulatory Visit (INDEPENDENT_AMBULATORY_CARE_PROVIDER_SITE_OTHER): Payer: BC Managed Care – PPO | Admitting: Cardiovascular Disease

## 2014-02-06 VITALS — BP 138/72 | HR 51 | Ht 69.0 in | Wt 153.0 lb

## 2014-02-06 DIAGNOSIS — I498 Other specified cardiac arrhythmias: Secondary | ICD-10-CM

## 2014-02-06 DIAGNOSIS — R001 Bradycardia, unspecified: Secondary | ICD-10-CM

## 2014-02-06 DIAGNOSIS — I10 Essential (primary) hypertension: Secondary | ICD-10-CM

## 2014-02-06 DIAGNOSIS — E785 Hyperlipidemia, unspecified: Secondary | ICD-10-CM

## 2014-02-06 DIAGNOSIS — I2510 Atherosclerotic heart disease of native coronary artery without angina pectoris: Secondary | ICD-10-CM

## 2014-02-06 MED ORDER — CLOPIDOGREL BISULFATE 75 MG PO TABS: 75.0000 mg | ORAL_TABLET | Freq: Every day | ORAL | Status: DC

## 2014-02-06 MED ORDER — NITROGLYCERIN 0.4 MG SL SUBL: 0.4000 mg | SUBLINGUAL_TABLET | SUBLINGUAL | Status: DC | PRN

## 2014-02-06 MED ORDER — AMLODIPINE BESYLATE 10 MG PO TABS: 10.0000 mg | ORAL_TABLET | Freq: Every day | ORAL | Status: DC

## 2014-02-06 NOTE — Patient Instructions (Signed)
Your physician wants you to follow-up in:  6 months.  You will receive a reminder letter in the mail two months in advance. If you don't receive a letter, please call our office to schedule the follow-up appointment.  Your physician has recommended you make the following change in your medication:  Stop Atenolol

## 2014-02-06 NOTE — Progress Notes (Signed)
History of Present Illness: 78 yo male with history of CAD s/p 4V CABG 2003 (LIMA to LAD, SVG to Diagonal, SVG to OM, SVG to PDA), HLD with statin intolerance who is here today for cardiac follow up. He has been followed in the past by Dr. Riley Kill. He had an anterior MI prior ot 1998 at which time a bare metal stent was placed in the LAD. Cardiac cath 2003 with severe stenoses in the Diagonal, OM2 and PDA. He underwent 4V CABG in 2003. 4.0 x 16 mm Liberte mid RCA in 2008. Follow up cath August 07, 2010 with 40-50% proximal LAD stent restenosis with moderate disease in the whole vessel, 50% proximal Circumflex stenosis, 90% OM1, mild disease RCA, SVG to diagonal patent, SVG to OM occluded, SVG to PDA occluded, LIMA to LAD atretic. The OM was treated with a 3. 5 x 20 mm Promus DES. I saw him in August 2014 and he had chest pain c/w unstable angina. Repeat cath 03/26/13: prox to mid LAD 60-70% (unchanged), Diagonal occluded, OM1 85-90% dist to stent, mid RCA 40%, mid RCA stent patent with 30% ISR, SVG to Diagonal patent, SVG to OM known to be occluded, SVG to PDA known to be occluded, LIMA to LAD known to be atretic, EF 55%. FFR of LAD: 0.90 (not flow limiting). PCI: 3x32 mm Promus premier DES to OM1 overlapping old stent. Post PCI course uneventful.   He is here today for follow up. He has been doing well. He describes one sharp chest pain last week. He took a NTG and it resolved. Occurred at rest. No chest pains or SOB with exertion.  Primary Care Physician: None  Last Lipid Profile:Lipid Panel     Component Value Date/Time   CHOL 168 09/26/2012 0907   TRIG 118.0 09/26/2012 0907   HDL 31.40* 09/26/2012 0907   CHOLHDL 5 09/26/2012 0907   VLDL 23.6 09/26/2012 0907   LDLCALC 113* 09/26/2012 0907     Past Medical History  Diagnosis Date  . CAD (coronary artery disease)     a. Ant MI 1998;  b. 2003: s/p CABG x 4 (VG->Diag, VG->OM, VG->PDA, LIMA->LAD);  c. PCI 2008: RCA - 4.0x16 Liberte BMS;  d.  07/2010 PCI: OM1- 3.5x20 Promus DES;  e. 03/2013 Cath/PCI: LM nl, LAD 60-70p/m (FFR 0.9->Med Rx), Diag 100, LCX patent prox stent, OM1 95-90(3.0x32 Promus Premier DES), RCA dom, 70m, 30 ISR, VG->Diag ok, VG->OM KTBO, VG->PDA KTBO, LIMA->LAD known atresia, EF 55%  . Dyslipidemia   . Statin intolerance   . Hypertension     Past Surgical History  Procedure Laterality Date  . Laparoscopic incisional / umbilical / ventral hernia repair  2004  . Bronchogenic cyst resection  08/06/1997  . Inguinal hernia repair Right     "3 times" (03/26/2013)  . Cholecystectomy    . Ankle fracture surgery Left ~ 1999    "broke in 1993; casted; PCP said not broken; had MI; walked for rehab; wore ankle out; put in plates and screws" (03/26/2013)  . Coronary artery bypass graft  2003    x4 LIMA to LAD SVG digonal, OM, PDA  . Coronary angioplasty with stent placement  1998-03/26/2013    "2 at one time in 1998; this one  today is the 3rd one I've had since OHS in 2003" (03/26/2013)  . Coronary angioplasty with stent placement  10/05/2006    4.73mmx 16mm Liberte non DES    Current Outpatient Prescriptions  Medication Sig Dispense Refill  .  amLODipine (NORVASC) 10 MG tablet Take 10 mg by mouth at bedtime.      Marland Kitchen. aspirin EC 81 MG tablet Take 81 mg by mouth at bedtime.      Marland Kitchen. atenolol (TENORMIN) 25 MG tablet Take 25 mg by mouth at bedtime.      . clopidogrel (PLAVIX) 75 MG tablet Take 75 mg by mouth at bedtime.      . nitroGLYCERIN (NITROSTAT) 0.4 MG SL tablet Place 0.4 mg under the tongue every 5 (five) minutes as needed for chest pain.       No current facility-administered medications for this visit.    No Known Allergies  History   Social History  . Marital Status: Married    Spouse Name: N/A    Number of Children: N/A  . Years of Education: N/A   Occupational History  . Not on file.   Social History Main Topics  . Smoking status: Never Smoker   . Smokeless tobacco: Never Used  . Alcohol Use: Yes      Comment: 03/26/2013 "couple shots some weeks; none for 2 months; just take a drink q now and then"  . Drug Use: No  . Sexual Activity: Yes   Other Topics Concern  . Not on file   Social History Narrative  . No narrative on file    Family History  Problem Relation Age of Onset  . Hypertension Other   . Heart disease Other   . Kidney cancer Other     Review of Systems:  As stated in the HPI and otherwise negative.   BP 138/72  Pulse 51  Ht 5\' 9"  (1.753 m)  Wt 153 lb (69.4 kg)  BMI 22.58 kg/m2  Physical Examination: General: Well developed, well nourished, NAD HEENT: OP clear, mucus membranes moist SKIN: warm, dry. No rashes. Neuro: No focal deficits Musculoskeletal: Muscle strength 5/5 all ext Psychiatric: Mood and affect normal Neck: No JVD, no carotid bruits, no thyromegaly, no lymphadenopathy. Lungs:Clear bilaterally, no wheezes, rhonci, crackles Cardiovascular: Regular rate and rhythm. No murmurs, gallops or rubs. Abdomen:Soft. Bowel sounds present. Non-tender.  Extremities: No lower extremity edema. Pulses are 2 + in the bilateral DP/PT.  Cardiac cath 03/26/13: PCI Note: He was given a bolus of Angiomax and a drip was started. I engaged the the left main with a XB 3.0 guiding catheter. When the ACT was over 200, I passed a BMW wire down into the OM branch. A 2.0 x 12 mm balloon was used to pre-dilate the stenosis. I then carefully positioned and deployed a 3.0 x 32 mm Promus Premier DES in the OM branch overlapping the old stent. I then post-dilated the stent with a 3.25 x 20 mm [place] balloon x 2. The stenosis was taken from 85% down to 0%.  I then turned my attention to the LAD. The proximal stented segment had evidence of at least moderate restenosis. This likely represented a 60-70% stenosis but was only minimally changed from last cath in 2011. An FFR of this vessel was 0.90 after infusion of IV adenosine for 120 seconds. This suggested that the lesion is not flow limiting.   There were no immediate complications. Plavix 300 mg po x 1 during case. An Angioseal femoral artery closure device was placed in the right femoral artery. The patient was taken to the recovery area in stable condition.  Hemodynamic Findings:  Central aortic pressure: 118/59  Left ventricular pressure: 121/8/12  Angiographic Findings:  Left main: No obstructive disease.  Left Anterior Descending Artery: Moderate to large caliber vessel that courses to the apex. The proximal to mid vessel stented segment with 60-70% restenosis, grossly unchanged from last cath in 2011. The distal vessel has diffuse mild stenosis. The diagonal branch is occluded but fills from the patent vein graft.  Circumflex Artery: Large caliber vessel with patent stent in proximal vessel extending into OM1. The first OM branch has a hazy, 85-90% stenosis distal to the stent. The AV groove Circumflex is small in caliber and has mild plaque disease.  Right Coronary Artery: Large dominant vessel with 40% mid stenosis, patent mid stent with 30% restenosis, mild distal disease.  Graft Anatomy:  SVG to Diagonal is patent  SVG to OM known to be occluded and not injected  SVG to PDA known to be occluded and not injected  LIMA to mid LAD is known to be atretic and not injected  Left Ventricular Angiogram: LVEF=55%. 1+ MR  Impression:  1. Triple vessel CAD s/p CABG with 1/4 patent grafts, patent stents LAD and RCA.  2. Unstable angina (class III) secondary to severe stenosis OM1  3. Successful PTCA/DES x 1 OM1  4. Moderate disease stented segment of proximal LAD, FFR of 0.90 suggesting this lesion is not flow limiting.  5. Preserved LV systolic function  EKG: sinus, rate 51 bpm.   Assessment and Plan:   1. CAD: Stable. PCI of OM August 2014. Continue medical therapy including ASA, Plavix, Norvasc.  Will stop beta blocker with bradycardia.   2. HTN: BP controlled at home. No changes. If BP elevated after stopping atenolol,  would consider adding an ARB.  3. HLD: He is intolerant of statins. Most recent lipids with Total Chol: 207, HDL: 55, LDL: 139.   4. Sinus brady: stop atenolol.

## 2014-05-03 ENCOUNTER — Other Ambulatory Visit: Payer: Self-pay | Admitting: Cardiovascular Disease

## 2014-07-18 ENCOUNTER — Encounter (HOSPITAL_COMMUNITY): Payer: Self-pay | Admitting: Cardiovascular Disease

## 2015-01-07 ENCOUNTER — Other Ambulatory Visit: Payer: Self-pay | Admitting: Cardiovascular Disease

## 2015-02-13 ENCOUNTER — Ambulatory Visit (INDEPENDENT_AMBULATORY_CARE_PROVIDER_SITE_OTHER): Payer: BLUE CROSS/BLUE SHIELD | Admitting: Cardiovascular Disease

## 2015-02-13 ENCOUNTER — Encounter: Payer: Self-pay | Admitting: Cardiovascular Disease

## 2015-02-13 VITALS — BP 110/68 | HR 56 | Ht 67.0 in | Wt 149.0 lb

## 2015-02-13 DIAGNOSIS — I10 Essential (primary) hypertension: Secondary | ICD-10-CM | POA: Diagnosis not present

## 2015-02-13 DIAGNOSIS — E785 Hyperlipidemia, unspecified: Secondary | ICD-10-CM | POA: Diagnosis not present

## 2015-02-13 DIAGNOSIS — I2510 Atherosclerotic heart disease of native coronary artery without angina pectoris: Secondary | ICD-10-CM

## 2015-02-13 MED ORDER — NITROGLYCERIN 0.4 MG SL SUBL: 0.4000 mg | SUBLINGUAL_TABLET | SUBLINGUAL | Status: DC | PRN

## 2015-02-13 MED ORDER — CLOPIDOGREL BISULFATE 75 MG PO TABS: 75.0000 mg | ORAL_TABLET | Freq: Every day | ORAL | Status: DC

## 2015-02-13 MED ORDER — AMLODIPINE BESYLATE 10 MG PO TABS: 10.0000 mg | ORAL_TABLET | Freq: Every day | ORAL | Status: DC

## 2015-02-13 NOTE — Patient Instructions (Signed)
Medication Instructions:  Your physician recommends that you continue on your current medications as directed. Please refer to the Current Medication list given to you today.   Labwork: none  Testing/Procedures: none  Follow-Up: Your physician wants you to follow-up in: 6 months.  You will receive a reminder letter in the mail two months in advance. If you don't receive a letter, please call our office to schedule the follow-up appointment.       

## 2015-02-13 NOTE — Progress Notes (Signed)
Chief Complaint  Patient presents with  . Follow-up      History of Present Illness: 79 yo male with history of CAD s/p 4V CABG 2003 (LIMA to LAD, SVG to Diagonal, SVG to OM, SVG to PDA), HLD with statin intolerance who is here today for cardiac follow up. He has been followed in the past by Dr. Riley Kill. He had an anterior MI in 1998 at which time a bare metal stent was placed in the LAD. Cardiac cath 2003 with severe stenoses in the Diagonal, OM2 and PDA. He underwent 4V CABG in 2003. 4.0 x 16 mm Liberte mid RCA in 2008. Follow up cath August 07, 2010 with 40-50% proximal LAD stent restenosis with moderate disease in the whole vessel, 50% proximal Circumflex stenosis, 90% OM1, mild disease RCA, SVG to diagonal patent, SVG to OM occluded, SVG to PDA occluded, LIMA to LAD atretic. The OM was treated with a 3. 5 x 20 mm Promus DES. I saw him in August 2014 and he had chest pain c/w unstable angina. Repeat cath 03/26/13: prox to mid LAD 60-70% (unchanged), Diagonal occluded, OM1 85-90% dist to stent, mid RCA 40%, mid RCA stent patent with 30% ISR, SVG to Diagonal patent, SVG to OM known to be occluded, SVG to PDA known to be occluded, LIMA to LAD known to be atretic, EF 55%. FFR of LAD: 0.90 (not flow limiting). PCI: 3.0 x32 mm Promus premier DES to OM1 overlapping old stent. Post PCI course uneventful.   He is here today for follow up. He has been doing well. No chest pain or SOB.   Primary Care Physician: None  Last Lipid Profile: Last lipids in primary care 12/10/14 with TC 197, HDL 51, TG 47, LDL 137  Past Medical History  Diagnosis Date  . CAD (coronary artery disease)     a. Ant MI 1998;  b. 2003: s/p CABG x 4 (VG->Diag, VG->OM, VG->PDA, LIMA->LAD);  c. PCI 2008: RCA - 4.0x16 Liberte BMS;  d. 07/2010 PCI: OM1- 3.5x20 Promus DES;  e. 03/2013 Cath/PCI: LM nl, LAD 60-70p/m (FFR 0.9->Med Rx), Diag 100, LCX patent prox stent, OM1 95-90(3.0x32 Promus Premier DES), RCA dom, 43m, 30 ISR, VG->Diag ok,  VG->OM KTBO, VG->PDA KTBO, LIMA->LAD known atresia, EF 55%  . Dyslipidemia   . Statin intolerance   . Hypertension     Past Surgical History  Procedure Laterality Date  . Laparoscopic incisional / umbilical / ventral hernia repair  2004  . Bronchogenic cyst resection  08/06/1997  . Inguinal hernia repair Right     "3 times" (03/26/2013)  . Cholecystectomy    . Ankle fracture surgery Left ~ 1999    "broke in 1993; casted; PCP said not broken; had MI; walked for rehab; wore ankle out; put in plates and screws" (03/26/2013)  . Coronary artery bypass graft  2003    x4 LIMA to LAD SVG digonal, OM, PDA  . Coronary angioplasty with stent placement  1998-03/26/2013    "2 at one time in 1998; this one  today is the 3rd one I've had since OHS in 2003" (03/26/2013)  . Coronary angioplasty with stent placement  10/05/2006    4.43mmx 16mm Liberte non DES  . Left heart catheterization with coronary/graft angiogram N/A 03/26/2013    Procedure: LEFT HEART CATHETERIZATION WITH Isabel Caprice;  Surgeon: Kathleene Hazel, MD;  Location: [facility];  Service: Cardiovascular;  Laterality: N/A;    Current Outpatient Prescriptions  Medication Sig Dispense Refill  .  amLODipine (NORVASC) 10 MG tablet Take 1 tablet (10 mg total) by mouth daily. 90 tablet 3  . aspirin EC 81 MG tablet Take 81 mg by mouth at bedtime.    . clopidogrel (PLAVIX) 75 MG tablet Take 1 tablet (75 mg total) by mouth daily. 90 tablet 3  . nitroGLYCERIN (NITROSTAT) 0.4 MG SL tablet Place 1 tablet (0.4 mg total) under the tongue every 5 (five) minutes as needed for chest pain. 75 tablet 3   No current facility-administered medications for this visit.    No Known Allergies  History   Social History  . Marital Status: Married    Spouse Name: N/A  . Number of Children: N/A  . Years of Education: N/A   Occupational History  . Not on file.   Social History Main Topics  . Smoking status: Never Smoker   . Smokeless  tobacco: Never Used  . Alcohol Use: Yes     Comment: 03/26/2013 "couple shots some weeks; none for 2 months; just take a drink q now and then"  . Drug Use: No  . Sexual Activity: Yes   Other Topics Concern  . Not on file   Social History Narrative    Family History  Problem Relation Age of Onset  . Hypertension Other   . Heart disease Other   . Kidney cancer Other     Review of Systems:  As stated in the HPI and otherwise negative.   BP 110/68 mmHg  Pulse 56  Ht 5\' 7"  (1.702 m)  Wt 149 lb (67.586 kg)  BMI 23.33 kg/m2  Physical Examination: General: Well developed, well nourished, NAD HEENT: OP clear, mucus membranes moist SKIN: warm, dry. No rashes. Neuro: No focal deficits Musculoskeletal: Muscle strength 5/5 all ext Psychiatric: Mood and affect normal Neck: No JVD, no carotid bruits, no thyromegaly, no lymphadenopathy. Lungs:Clear bilaterally, no wheezes, rhonci, crackles Cardiovascular: Regular rate and rhythm. No murmurs, gallops or rubs. Abdomen:Soft. Bowel sounds present. Non-tender.  Extremities: No lower extremity edema. Pulses are 2 + in the bilateral DP/PT.  Cardiac cath 03/26/13: PCI Note: He was given a bolus of Angiomax and a drip was started. I engaged the the left main with a XB 3.0 guiding catheter. When the ACT was over 200, I passed a BMW wire down into the OM branch. A 2.0 x 12 mm balloon was used to pre-dilate the stenosis. I then carefully positioned and deployed a 3.0 x 32 mm Promus Premier DES in the OM branch overlapping the old stent. I then post-dilated the stent with a 3.25 x 20 mm [place] balloon x 2. The stenosis was taken from 85% down to 0%.  I then turned my attention to the LAD. The proximal stented segment had evidence of at least moderate restenosis. This likely represented a 60-70% stenosis but was only minimally changed from last cath in 2011. An FFR of this vessel was 0.90 after infusion of IV adenosine for 120 seconds. This suggested that the  lesion is not flow limiting.  There were no immediate complications. Plavix 300 mg po x 1 during case. An Angioseal femoral artery closure device was placed in the right femoral artery. The patient was taken to the recovery area in stable condition.  Hemodynamic Findings:  Central aortic pressure: 118/59  Left ventricular pressure: 121/8/12  Angiographic Findings:  Left main: No obstructive disease.  Left Anterior Descending Artery: Moderate to large caliber vessel that courses to the apex. The proximal to mid vessel stented segment  with 60-70% restenosis, grossly unchanged from last cath in 2011. The distal vessel has diffuse mild stenosis. The diagonal branch is occluded but fills from the patent vein graft.  Circumflex Artery: Large caliber vessel with patent stent in proximal vessel extending into OM1. The first OM branch has a hazy, 85-90% stenosis distal to the stent. The AV groove Circumflex is small in caliber and has mild plaque disease.  Right Coronary Artery: Large dominant vessel with 40% mid stenosis, patent mid stent with 30% restenosis, mild distal disease.  Graft Anatomy:  SVG to Diagonal is patent  SVG to OM known to be occluded and not injected  SVG to PDA known to be occluded and not injected  LIMA to mid LAD is known to be atretic and not injected  Left Ventricular Angiogram: LVEF=55%. 1+ MR  Impression:  1. Triple vessel CAD s/p CABG with 1/4 patent grafts, patent stents LAD and RCA.  2. Unstable angina (class III) secondary to severe stenosis OM1  3. Successful PTCA/DES x 1 OM1  4. Moderate disease stented segment of proximal LAD, FFR of 0.90 suggesting this lesion is not flow limiting.  5. Preserved LV systolic function  EKG:  EKG is ordered today. The ekg ordered today demonstrates sinus brady, rate 58 bpm  Recent Labs: No results found for requested labs within last 365 days.   Lipid Panel Scanned into EPIC   Wt Readings from Last 3 Encounters:  02/13/15 149  lb (67.586 kg)  02/06/14 153 lb (69.4 kg)  07/17/13 163 lb (73.936 kg)     Other studies Reviewed: Additional studies/ records that were reviewed today include: . Review of the above records demonstrates:    Assessment and Plan:   1. CAD: Stable. PCI of OM August 2014. Continue medical therapy including ASA, Plavix, Norvasc.  No beta blocker with bradycardia.   2. HTN: BP controlled at home. No changes.   3. HLD: He is intolerant of statins. We discussed starting a  [id number] inh but he is not interested in trying this.   4. Sinus bradycardia: Stable. Asymptomatic.   Current medicines are reviewed at length with the patient today.  The patient does not have concerns regarding medicines.  The following changes have been made:  no change  Labs/ tests ordered today include:   Orders Placed This Encounter  Procedures  . EKG 12-Lead    Disposition:   FU with me in 6 months  Signed, Verne Carrowhristopher Jilliann Subramanian, MD 02/13/2015 11:37 AM    [facility] [address], [place], [place]  [id number] Phone: [phone]; Fax: [phone]

## 2015-07-12 ENCOUNTER — Encounter (HOSPITAL_COMMUNITY): Payer: Self-pay

## 2015-07-12 ENCOUNTER — Emergency Department (HOSPITAL_COMMUNITY): Payer: BLUE CROSS/BLUE SHIELD

## 2015-07-12 ENCOUNTER — Inpatient Hospital Stay (HOSPITAL_COMMUNITY)
Admission: EM | Admit: 2015-07-12 | Discharge: 2015-07-14 | DRG: 312 | Disposition: A | Discharge status: Home / Self Care | Payer: BLUE CROSS/BLUE SHIELD | Attending: Family Medicine | Admitting: Family Medicine

## 2015-07-12 DIAGNOSIS — Z7902 Long term (current) use of antithrombotics/antiplatelets: Secondary | ICD-10-CM

## 2015-07-12 DIAGNOSIS — I2510 Atherosclerotic heart disease of native coronary artery without angina pectoris: Secondary | ICD-10-CM | POA: Diagnosis present

## 2015-07-12 DIAGNOSIS — R55 Syncope and collapse: Secondary | ICD-10-CM | POA: Diagnosis present

## 2015-07-12 DIAGNOSIS — I10 Essential (primary) hypertension: Secondary | ICD-10-CM | POA: Insufficient documentation

## 2015-07-12 DIAGNOSIS — I252 Old myocardial infarction: Secondary | ICD-10-CM

## 2015-07-12 DIAGNOSIS — R531 Weakness: Secondary | ICD-10-CM | POA: Insufficient documentation

## 2015-07-12 DIAGNOSIS — E7800 Pure hypercholesterolemia, unspecified: Secondary | ICD-10-CM | POA: Diagnosis present

## 2015-07-12 DIAGNOSIS — E785 Hyperlipidemia, unspecified: Secondary | ICD-10-CM | POA: Insufficient documentation

## 2015-07-12 DIAGNOSIS — J323 Chronic sphenoidal sinusitis: Secondary | ICD-10-CM | POA: Diagnosis present

## 2015-07-12 DIAGNOSIS — Z8249 Family history of ischemic heart disease and other diseases of the circulatory system: Secondary | ICD-10-CM

## 2015-07-12 DIAGNOSIS — I739 Peripheral vascular disease, unspecified: Secondary | ICD-10-CM | POA: Diagnosis present

## 2015-07-12 DIAGNOSIS — R7989 Other specified abnormal findings of blood chemistry: Secondary | ICD-10-CM | POA: Insufficient documentation

## 2015-07-12 DIAGNOSIS — Z7982 Long term (current) use of aspirin: Secondary | ICD-10-CM | POA: Diagnosis not present

## 2015-07-12 DIAGNOSIS — Z955 Presence of coronary angioplasty implant and graft: Secondary | ICD-10-CM | POA: Diagnosis not present

## 2015-07-12 DIAGNOSIS — Z951 Presence of aortocoronary bypass graft: Secondary | ICD-10-CM

## 2015-07-12 LAB — BASIC METABOLIC PANEL
Anion gap: 6 (ref 5–15)
BUN: 21 mg/dL — AB (ref 6–20)
CHLORIDE: 111 mmol/L (ref 101–111)
CO2: 24 mmol/L (ref 22–32)
CREATININE: 1.44 mg/dL — AB (ref 0.61–1.24)
Calcium: 8.9 mg/dL (ref 8.9–10.3)
GFR calc Af Amer: 53 mL/min — ABNORMAL LOW (ref >60)
GFR calc non Af Amer: 45 mL/min — ABNORMAL LOW (ref >60)
Glucose, Bld: 109 mg/dL — ABNORMAL HIGH (ref 65–99)
Potassium: 4.3 mmol/L (ref 3.5–5.1)
SODIUM: 141 mmol/L (ref 135–145)

## 2015-07-12 LAB — CBC
HCT: 36.8 % — ABNORMAL LOW (ref 39.0–52.0)
Hemoglobin: 12.1 g/dL — ABNORMAL LOW (ref 13.0–17.0)
MCH: 32 pg (ref 26.0–34.0)
MCHC: 32.9 g/dL (ref 30.0–36.0)
MCV: 97.4 fL (ref 78.0–100.0)
PLATELETS: 176 10*3/uL (ref 150–400)
RBC: 3.78 MIL/uL — ABNORMAL LOW (ref 4.22–5.81)
RDW: 13.6 % (ref 11.5–15.5)
WBC: 7.6 10*3/uL (ref 4.0–10.5)

## 2015-07-12 LAB — I-STAT TROPONIN, ED: Troponin i, poc: 0.01 ng/mL (ref 0.00–0.08)

## 2015-07-12 LAB — URINALYSIS, ROUTINE W REFLEX MICROSCOPIC
BILIRUBIN URINE: NEGATIVE
GLUCOSE, UA: NEGATIVE mg/dL
HGB URINE DIPSTICK: NEGATIVE
KETONES UR: NEGATIVE mg/dL
LEUKOCYTES UA: NEGATIVE
Nitrite: NEGATIVE
PROTEIN: NEGATIVE mg/dL
Specific Gravity, Urine: 1.016 (ref 1.005–1.030)
pH: 5.5 (ref 5.0–8.0)

## 2015-07-12 LAB — D-DIMER, QUANTITATIVE (NOT AT ARMC): D DIMER QUANT: 0.4 ug{FEU}/mL (ref 0.00–0.50)

## 2015-07-12 LAB — TSH: TSH: 0.929 u[IU]/mL (ref 0.350–4.500)

## 2015-07-12 LAB — TROPONIN I: Troponin I: 0.05 ng/mL — ABNORMAL HIGH (ref <0.031)

## 2015-07-12 MED ORDER — ENOXAPARIN SODIUM 40 MG/0.4ML ~~LOC~~ SOLN
40.0000 mg | SUBCUTANEOUS | Status: DC
Start: 2015-07-12 — End: 2015-07-14
  Administered 2015-07-12: 40 mg via SUBCUTANEOUS
  Filled 2015-07-12 (×2): qty 0.4

## 2015-07-12 MED ORDER — CLOPIDOGREL BISULFATE 75 MG PO TABS
75.0000 mg | ORAL_TABLET | Freq: Every day | ORAL | Status: DC
  Administered 2015-07-13 – 2015-07-14 (×2): 75 mg via ORAL
  Filled 2015-07-12 (×2): qty 1

## 2015-07-12 MED ORDER — SODIUM CHLORIDE 0.9 % IV SOLN: 250.0000 mL | INTRAVENOUS | Status: DC | PRN

## 2015-07-12 MED ORDER — AMLODIPINE BESYLATE 10 MG PO TABS: 10.0000 mg | ORAL_TABLET | Freq: Every day | ORAL | Status: DC

## 2015-07-12 MED ORDER — SODIUM CHLORIDE 0.9 % IJ SOLN: 3.0000 mL | INTRAMUSCULAR | Status: DC | PRN

## 2015-07-12 MED ORDER — SODIUM CHLORIDE 0.9 % IJ SOLN
3.0000 mL | Freq: Two times a day (BID) | INTRAMUSCULAR | Status: DC
  Administered 2015-07-12 – 2015-07-14 (×2): 3 mL via INTRAVENOUS

## 2015-07-12 MED ORDER — SODIUM CHLORIDE 0.9 % IJ SOLN
3.0000 mL | Freq: Two times a day (BID) | INTRAMUSCULAR | Status: DC
  Administered 2015-07-13 – 2015-07-14 (×3): 3 mL via INTRAVENOUS

## 2015-07-12 MED ORDER — AMLODIPINE BESYLATE 10 MG PO TABS
10.0000 mg | ORAL_TABLET | Freq: Every day | ORAL | Status: DC
  Administered 2015-07-12 – 2015-07-13 (×2): 10 mg via ORAL
  Filled 2015-07-12 (×5): qty 1

## 2015-07-12 MED ORDER — ASPIRIN EC 81 MG PO TBEC
81.0000 mg | DELAYED_RELEASE_TABLET | Freq: Every day | ORAL | Status: DC
  Administered 2015-07-12 – 2015-07-13 (×2): 81 mg via ORAL
  Filled 2015-07-12 (×2): qty 1

## 2015-07-12 NOTE — ED Provider Notes (Signed)
CSN: [id number]     Arrival date & time 07/12/15  1447 History   First MD Initiated Contact with Patient 07/12/15 1508     Chief Complaint  Patient presents with  . Loss of Consciousness     (Consider location/radiation/quality/duration/timing/severity/associated sxs/prior Treatment) HPI Patient with extensive coronary artery disease presents with syncopal episode witnessed by his wife. 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 Patient describes this as a generalized weakness. He also describes dyspnea with any exertion. No nausea, diaphoresis or chest pain/pressure. He states he walked back to the house and sat down on a bench on the front porch. States he felt too weak to walk back into the house. Wife states she witnessed a 2 minute episode where the patient was unresponsive. States he did not fall and there was no trauma. There was no seizure-like movement. She called EMS. Patient continues to have no chest pain. He has no new lower extremity swelling or pain. He denies any current shortness of breath. He's had no recent fever, chills, nausea, vomiting, diarrhea, cough, dysuria. Patient does admit to urinary frequency. Denies any gross blood or melanotic stools. Last had stents placed 3 years ago. Dr. Riley Kill is his cardiologist. Patient states he does not have a primary care provider. Patient does admit to having similar episodes but less severe for the last few weeks. Past Medical History  Diagnosis Date  . CAD (coronary artery disease)     a. Ant MI 1998;  b. 2003: s/p CABG x 4 (VG->Diag, VG->OM, VG->PDA, LIMA->LAD);  c. PCI 2008: RCA - 4.0x16 Liberte BMS;  d. 07/2010 PCI: OM1- 3.5x20 Promus DES;  e. 03/2013 Cath/PCI: LM nl, LAD 60-70p/m (FFR 0.9->Med Rx), Diag 100, LCX patent prox stent, OM1 95-90(3.0x32 Promus Premier DES), RCA dom, 56m, 30 ISR, VG->Diag ok, VG->OM KTBO, VG->PDA KTBO, LIMA->LAD known atresia, EF 55%  . Dyslipidemia   . Statin  intolerance   . Hypertension    Past Surgical History  Procedure Laterality Date  . Laparoscopic incisional / umbilical / ventral hernia repair  2004  . Bronchogenic cyst resection  08/06/1997  . Inguinal hernia repair Right     "3 times" (03/26/2013)  . Cholecystectomy    . Ankle fracture surgery Left ~ 1999    "broke in 1993; casted; PCP said not broken; had MI; walked for rehab; wore ankle out; put in plates and screws" (03/26/2013)  . Coronary artery bypass graft  2003    x4 LIMA to LAD SVG digonal, OM, PDA  . Coronary angioplasty with stent placement  1998-03/26/2013    "2 at one time in 1998; this one  today is the 3rd one I've had since OHS in 2003" (03/26/2013)  . Coronary angioplasty with stent placement  10/05/2006    4.32mmx 16mm Liberte non DES  . Left heart catheterization with coronary/graft angiogram N/A 03/26/2013    Procedure: LEFT HEART CATHETERIZATION WITH Isabel Caprice;  Surgeon: Kathleene Hazel, MD;  Location: [facility] CATH LAB;  Service: Cardiovascular;  Laterality: N/A;   Family History  Problem Relation Age of Onset  . Hypertension Other   . Heart disease Other   . Kidney cancer Other    Social History  Substance Use Topics  . Smoking status: Never Smoker   . Smokeless tobacco: Never Used  . Alcohol Use: Yes     Comment: 03/26/2013 "couple shots some weeks; none for 2 months; just take a drink q now and  then"    Review of Systems  Constitutional: Negative for fever and chills.  HENT: Negative for congestion, sinus pressure and sore throat.   Respiratory: Positive for shortness of breath. Negative for cough and chest tightness.   Cardiovascular: Negative for chest pain, palpitations and leg swelling.  Gastrointestinal: Negative for nausea, vomiting, abdominal pain, diarrhea, constipation and blood in stool.  Genitourinary: Positive for frequency. Negative for dysuria, hematuria and flank pain.  Musculoskeletal: Negative for myalgias, back pain,  joint swelling, arthralgias, neck pain and neck stiffness.  Skin: Negative for rash and wound.  Neurological: Positive for dizziness, syncope, weakness (generalized) and light-headedness. Negative for numbness.  Psychiatric/Behavioral: Negative for confusion.  All other systems reviewed and are negative.     Allergies  Review of patient's allergies indicates no known allergies.  Home Medications   Prior to Admission medications   Medication Sig Start Date End Date Taking? Authorizing Provider  amLODipine (NORVASC) 10 MG tablet Take 1 tablet (10 mg total) by mouth daily. 02/13/15  Yes Kathleene Hazelhristopher D McAlhany, MD  aspirin EC 81 MG tablet Take 81 mg by mouth at bedtime.   Yes Historical Provider, MD  clopidogrel (PLAVIX) 75 MG tablet Take 1 tablet (75 mg total) by mouth daily. 02/13/15  Yes Kathleene Hazelhristopher D McAlhany, MD  nitroGLYCERIN (NITROSTAT) 0.4 MG SL tablet Place 1 tablet (0.4 mg total) under the tongue every 5 (five) minutes as needed for chest pain. 02/13/15  Yes Kathleene Hazelhristopher D McAlhany, MD   BP 123/62 mmHg  Pulse 66  Temp(Src) 97.7 F (36.5 C) (Oral)  Resp 11  Ht 5\' 7"  (1.702 m)  Wt 148 lb (67.132 kg)  BMI 23.17 kg/m2  SpO2 98% Physical Exam  Constitutional: He is oriented to person, place, and time. He appears well-developed and well-nourished. No distress.  HENT:  Head: Normocephalic and atraumatic.  Mouth/Throat: Oropharynx is clear and moist. No oropharyngeal exudate.  Eyes: EOM are normal. Pupils are equal, round, and reactive to light.  Neck: Normal range of motion. Neck supple.  No posterior midline cervical tenderness to palpation.  Cardiovascular: Normal rate and regular rhythm.  Exam reveals no gallop and no friction rub.   Murmur heard. Pulmonary/Chest: Effort normal and breath sounds normal. No respiratory distress. He has no wheezes. He has no rales. He exhibits no tenderness.  Abdominal: Soft. Bowel sounds are normal. He exhibits no distension and no mass. There is  no tenderness. There is no rebound and no guarding.  Musculoskeletal: Normal range of motion. He exhibits no edema or tenderness.  No midline thoracic or lumbar tenderness. All distal pulses 2+ and equal. Patient lower extremity asymmetry. Patient's right calf is larger than his left. There is no firmness or tenderness. There is no pitting edema. Patient states that the asymmetry has been unchanged for several years following surgery that he had on his left leg.  Lymphadenopathy:    He has no cervical adenopathy.  Neurological: He is alert and oriented to person, place, and time.  Patient is alert and oriented x3 with clear, goal oriented speech. Patient has 5/5 motor in all extremities. Patient has mild decreased sensation to the left lower extremity compared to right. Bilateral finger-to-nose is normal with no signs of dysmetria.   Skin: Skin is warm and dry. No rash noted. He is not diaphoretic. No erythema.  Psychiatric: He has a normal mood and affect. His behavior is normal.  Nursing note and vitals reviewed.   ED Course  Procedures (including critical care time)  Labs Review Labs Reviewed  BASIC METABOLIC PANEL - Abnormal; Notable for the following:    Glucose, Bld 109 (*)    BUN 21 (*)    Creatinine, Ser 1.44 (*)    GFR calc non Af Amer 45 (*)    GFR calc Af Amer 53 (*)    All other components within normal limits  CBC - Abnormal; Notable for the following:    RBC 3.78 (*)    Hemoglobin 12.1 (*)    HCT 36.8 (*)    All other components within normal limits  URINALYSIS, ROUTINE W REFLEX MICROSCOPIC (NOT AT [facility])  D-DIMER, QUANTITATIVE (NOT AT [facility])  CBG MONITORING, ED  I-STAT TROPOININ, ED    Imaging Review Ct Head Wo Contrast  07/12/2015  CLINICAL DATA:  Syncopal episodes, weakness, dizziness. EXAM: CT HEAD WITHOUT CONTRAST TECHNIQUE: Contiguous axial images were obtained from the base of the skull through the vertex without intravenous contrast. COMPARISON:  01/19/2011  FINDINGS: Brain: No evidence of acute infarction, hemorrhage, extra-axial collection, ventriculomegaly, or mass effect. There is moderate brain parenchymal atrophy and chronic small vessel disease changes. Vascular: No hyperdense vessel. Vascular calcifications are noted at the skullbase. Skull: Negative for fracture or focal lesion. Sinuses/Orbits: There is partial opacification with air-fluid level within the right sphenoid sinus. Other: None. IMPRESSION: No acute intracranial abnormality. Atrophy, chronic microvascular disease. Right sphenoid sinusitis. Electronically Signed   By: Ted Mcalpine M.D.   On: 07/12/2015 18:10   Dg Chest Port 1 View  07/12/2015  CLINICAL DATA:  Severe weakness and shortness of breath today. EXAM: PORTABLE CHEST 1 VIEW COMPARISON:  01/19/2011. FINDINGS: Normal sized heart. Stable post CABG changes. Clear lungs with normal vascularity. Thoracic spine degenerative changes. IMPRESSION: No acute abnormality. Electronically Signed   By: Beckie Salts M.D.   On: 07/12/2015 16:22   I have personally reviewed and evaluated these images and lab results as part of my medical decision-making.   EKG Interpretation   Date/Time:  Saturday July 12 2015 14:59:48 EST Ventricular Rate:  62 PR Interval:  149 QRS Duration: 108 QT Interval:  409 QTC Calculation: 415 R Axis:   85 Text Interpretation:  Sinus rhythm Atrial premature complexes Borderline  right axis deviation Minimal ST elevation, anterior leads Confirmed by  Marcio Hoque  MD, Cree Kunert ([id number]) on 07/12/2015 7:11:28 PM      MDM   Final diagnoses:  Syncope, unspecified syncope type  Generalized weakness   Patient continues to have generalized weakness though vital signs and workup were essentially normal. Patient does have sphenoid sinusitis on CT. Discussed with family medicine resident Dr. Sampson Goon. Will admit to telemetry bed.     Loren Racer, MD 07/12/15 1911

## 2015-07-12 NOTE — H&P (Signed)
[facility] History and Physical Service Pager: [phone]  Patient name: Steve Gordon Medical record number: [id number] Date of birth: 10/16/1935 Age: 79 y.o. Gender: male  Primary Care Provider: No PCP Per Patient Consultants: EP cards Code Status:  Full per discussion on admission  Chief Complaint: Syncopal episode, generalized weakness  Assessment and Plan: Steve Gordon is a 79 y.o. male presenting with a syncopal episode. PMH is significant for CAD s/p 4V CABG 2003 (LIMA to LAD, SVG to Diagonal, SVG to OM, SVG to PDA) and stent placement, HLD, HTN, and statin intolerat  Syncope: From prodromal symptoms this sounds like vasovagal vs orthostatic hypotension (however negative on admission) vs arrhythmia (however with prodromal symptoms this may be less likely). Patient does have significant cardiac history. Initial EKG was not concerning for ischemia or infarct. ISTAT Troponin 0.01.  - [facility]  - f/u EKG in the AM  - trend troponins  - f/u TSH - echocardiogram and EP cards consult given significant CAD history and syncope order set.   Dyspnea on exertion: D-dimer 0.40. Hgb 12.1, slightly down from 12.9 previously. CXR without evidence of infection, no cough or other respiratory symptoms.  - continuous pulse ox - [place] as needed to keep O2 >90%.  Decreased left-sided sensation in ED: No difference in sensation on my exam. No neurologic deficits noted on my exam. CT head wo contrast negative for acute changes. Partial opacification of right sphenoid sinus, consistent with sinusitis.   CVD Follows with Cardiology, Dr. Clifton JamesMcAlhany [facility]). Last seen 02/13/2015. Has extensive PMH with CABG and stents.  No chest pain currently.  - Continue home plavix 75 mg daily, asa 81  HLD - Statin intolerance - in the future, could consider PSK-9 medication with lipid clinic given significant cardiac history.   HTN- Hypertensive on  exam to 160s/80s - continue home amlodipine 10 mg daily   Elevated SCr: Doesn't meet criteria for AKI. 1.44, baseline 1.2/1.3. - Continue to monitor with BMPs to assess for development of AKI. - IV saline lock; if poor hydration consider IVFs.   FEN/GI: IV saline lock, heart healthy Prophylaxis: Lovenox SQ  Disposition: Admit to telemetry, attending Dr. Randolm IdolFletke.   History of Present Illness:  Steve Gordon is a 79 y.o. male presenting with a syncopal episode.  The patient was doing work on his truck this afternoon and became weak/tired.  He walked up to the house became weaker and got dizzy/lightheaded.  No diaphoresis or facial flushing prior. His wife noted he sat down on the bench and wanted to rest.  His eyes then rolled back into his head, he was unresponsive to his name and to shaking so she called 911. She noted when she got back outside his eyes were normal but he did not speak for approximately 2 minutes.  He seemed to slouch a little during this event but did not lose muscle tone, have loss of bowel/bladder, or have abnormal movements. No trauma/injury.  After those 2 minutes,  he got up on his own and walked into the house doing regular chores (wife denies any issues with ambulation).   He felt like he may have had some palpitations today and intermittently throughout the last few weeks.   The prior evening when he went to the bathroom in the middle of the night he felt weak and dizzy, however this resolved the subsequent morning. He's noted fatigue/weakness and dizziness over the last 3-4weeks but nothing as  prominent as this event. He notes he often feels dizzy while "vertical" but doesn't note a strong association with standing or any particular movement.   In the ED, his EKG was unchanged and iSTAT troponin was negative. D-dimer was negative. CXR was negative. CT head showed atrophy and chronic microvascular disease and R sphenoid sinusitis. Orthostatics normal.  UA negative for  nitrites, leukocytes, ketones and normal specific gravity at 1.016.   Review Of Systems: Per HPI with the following additions: None Otherwise the remainder of the systems were negative.  Patient Active Problem List   Diagnosis Date Noted  . Faintness 07/12/2015  . Unstable angina (HCC) 03/27/2013  . Sinus bradycardia 12/14/2011  . Orthostatic hypotension 03/21/2011  . Incidental lung nodule, > 3mm and < 8mm 11/25/2010  . Coronary artery disease 11/25/2010  . METABOLIC ACIDOSIS 08/26/2010  . HYPERTENSION, BENIGN 08/26/2010  . HYPERCHOLESTEROLEMIA  IIA 03/07/2009  . CAD, ARTERY BYPASS GRAFT 03/07/2009    Past Medical History: Past Medical History  Diagnosis Date  . CAD (coronary artery disease)     a. Ant MI 1998;  b. 2003: s/p CABG x 4 (VG->Diag, VG->OM, VG->PDA, LIMA->LAD);  c. PCI 2008: RCA - 4.0x16 Liberte BMS;  d. 07/2010 PCI: OM1- 3.5x20 Promus DES;  e. 03/2013 Cath/PCI: LM nl, LAD 60-70p/m (FFR 0.9->Med Rx), Diag 100, LCX patent prox stent, OM1 95-90(3.0x32 Promus Premier DES), RCA dom, 5m, 30 ISR, VG->Diag ok, VG->OM KTBO, VG->PDA KTBO, LIMA->LAD known atresia, EF 55%  . Dyslipidemia   . Statin intolerance   . Hypertension     Past Surgical History: Past Surgical History  Procedure Laterality Date  . Laparoscopic incisional / umbilical / ventral hernia repair  2004  . Bronchogenic cyst resection  08/06/1997  . Inguinal hernia repair Right     "3 times" (03/26/2013)  . Cholecystectomy    . Ankle fracture surgery Left ~ 1999    "broke in 1993; casted; PCP said not broken; had MI; walked for rehab; wore ankle out; put in plates and screws" (03/26/2013)  . Coronary artery bypass graft  2003    x4 LIMA to LAD SVG digonal, OM, PDA  . Coronary angioplasty with stent placement  1998-03/26/2013    "2 at one time in 1998; this one  today is the 3rd one I've had since OHS in 2003" (03/26/2013)  . Coronary angioplasty with stent placement  10/05/2006    4.73mmx 16mm Liberte non DES   . Left heart catheterization with coronary/graft angiogram N/A 03/26/2013    Procedure: LEFT HEART CATHETERIZATION WITH Isabel Caprice;  Surgeon: Kathleene Hazel, MD;  Location: [facility] CATH LAB;  Service: Cardiovascular;  Laterality: N/A;    Social History: Social History  Substance Use Topics  . Smoking status: Never Smoker   . Smokeless tobacco: Never Used  . Alcohol Use: Yes     Comment: 03/26/2013 "couple shots some weeks; none for 2 months; just take a drink q now and then"   Additional social history: Never smoker, occasional alcohol use  Please also refer to relevant sections of EMR.  Family History: Family History  Problem Relation Age of Onset  . Hypertension Other   . Heart disease Other   . Kidney cancer Other     Allergies and Medications: No Known Allergies No current facility-administered medications on file prior to encounter.   Current Outpatient Prescriptions on File Prior to Encounter  Medication Sig Dispense Refill  . amLODipine (NORVASC) 10 MG tablet Take 1 tablet (10 mg  total) by mouth daily. 90 tablet 3  . aspirin EC 81 MG tablet Take 81 mg by mouth at bedtime.    . clopidogrel (PLAVIX) 75 MG tablet Take 1 tablet (75 mg total) by mouth daily. 90 tablet 3  . nitroGLYCERIN (NITROSTAT) 0.4 MG SL tablet Place 1 tablet (0.4 mg total) under the tongue every 5 (five) minutes as needed for chest pain. 75 tablet 3    Objective: BP 147/82 mmHg  Pulse 62  Temp(Src) 97.7 F (36.5 C) (Oral)  Resp 12  Ht  (1.702 m)  Wt 148 lb (67.132 kg)  BMI 23.17 kg/m2  SpO2 100% Exam: General: Lying in bed in NAD. Non-toxic and pleasant.  Eyes: Conjunctivae non-injected.    ENTM: Moist mucous membranes. Oropharynx clear. No nasal discharge.  Neck: Supple, no LAD Cardiovascular: RRR, intermittent PVCs noted on ascultation and telemetry.  No murmurs, rubs, or gallops noted. 1+  pitting edema on the right, trace pitting edema on the L. R calf larger than the  L which is stable.  Respiratory: No increased WOB. CTAB without wheezing, rhonchi, or crackles noted. Abdomen: +BS, soft, non-distended, non-tender.  MSK: Normal bulk and noted. No gross deformities noted.  Skin: No rashes noted  Neuro: A&O x4. Speech clear. EOMI, Uvula and tongue midline. Facial movements symmetric. 5/5 strength in the upper extremities and lower extremities bilaterally. Sensation intact bilaterally on my exam.   Psych:  Appropriate mood and affect.    Labs and Imaging: CBC BMET   Recent Labs Lab 07/12/15 1542  WBC 7.6  HGB 12.1*  HCT 36.8*  PLT 176    Recent Labs Lab 07/12/15 1542  NA 141  K 4.3  CL 111  CO2 24  BUN 21*  CREATININE 1.44*  GLUCOSE 109*  CALCIUM 8.9     Ct Head Wo Contrast  07/12/2015  CLINICAL DATA:  Syncopal episodes, weakness, dizziness. EXAM: CT HEAD WITHOUT CONTRAST TECHNIQUE: Contiguous axial images were obtained from the base of the skull through the vertex without intravenous contrast. COMPARISON:  01/19/2011 FINDINGS: Brain: No evidence of acute infarction, hemorrhage, extra-axial collection, ventriculomegaly, or mass effect. There is moderate brain parenchymal atrophy and chronic small vessel disease changes. Vascular: No hyperdense vessel. Vascular calcifications are noted at the skullbase. Skull: Negative for fracture or focal lesion. Sinuses/Orbits: There is partial opacification with air-fluid level within the right sphenoid sinus. Other: None. IMPRESSION: No acute intracranial abnormality. Atrophy, chronic microvascular disease. Right sphenoid sinusitis. Electronically Signed   By: Ted Mcalpine M.D.   On: 07/12/2015 18:10   Dg Chest Port 1 View  07/12/2015  CLINICAL DATA:  Severe weakness and shortness of breath today. EXAM: PORTABLE CHEST 1 VIEW COMPARISON:  01/19/2011. FINDINGS: Normal sized heart. Stable post CABG changes. Clear lungs with normal vascularity. Thoracic spine degenerative changes. IMPRESSION: No acute  abnormality. Electronically Signed   By: Beckie Salts M.D.   On: 07/12/2015 16:22    Joanna Puff, MD 07/12/2015, 8:12 PM PGY-2, [place] [facility] FPTS Intern pager: [phone], text pages welcome

## 2015-07-12 NOTE — ED Notes (Signed)
Pt brought in EMS for syncopal episode.  Pt reports he had been working on his truck for several hours and suddenly became very weak and dizzy.  When pt made it back inside, wife reports he sat down in chair and had a syncopal episode lasting aprox. 2 minutes. Pt denies any pain or nausea at this time.

## 2015-07-13 ENCOUNTER — Inpatient Hospital Stay (HOSPITAL_COMMUNITY): Payer: BLUE CROSS/BLUE SHIELD

## 2015-07-13 DIAGNOSIS — I10 Essential (primary) hypertension: Secondary | ICD-10-CM | POA: Insufficient documentation

## 2015-07-13 DIAGNOSIS — E785 Hyperlipidemia, unspecified: Secondary | ICD-10-CM

## 2015-07-13 DIAGNOSIS — R748 Abnormal levels of other serum enzymes: Secondary | ICD-10-CM

## 2015-07-13 DIAGNOSIS — R55 Syncope and collapse: Principal | ICD-10-CM

## 2015-07-13 DIAGNOSIS — R5383 Other fatigue: Secondary | ICD-10-CM

## 2015-07-13 DIAGNOSIS — I2510 Atherosclerotic heart disease of native coronary artery without angina pectoris: Secondary | ICD-10-CM | POA: Insufficient documentation

## 2015-07-13 DIAGNOSIS — R531 Weakness: Secondary | ICD-10-CM | POA: Insufficient documentation

## 2015-07-13 DIAGNOSIS — R5381 Other malaise: Secondary | ICD-10-CM

## 2015-07-13 DIAGNOSIS — R7989 Other specified abnormal findings of blood chemistry: Secondary | ICD-10-CM | POA: Insufficient documentation

## 2015-07-13 LAB — TROPONIN I
Troponin I: 0.15 ng/mL — ABNORMAL HIGH (ref <0.031)
Troponin I: 0.14 ng/mL — ABNORMAL HIGH (ref <0.031)
Troponin I: 0.09 ng/mL — ABNORMAL HIGH (ref <0.031)

## 2015-07-13 LAB — CBC
HCT: 36.2 % — ABNORMAL LOW (ref 39.0–52.0)
Hemoglobin: 11.9 g/dL — ABNORMAL LOW (ref 13.0–17.0)
MCH: 32.1 pg (ref 26.0–34.0)
MCHC: 32.9 g/dL (ref 30.0–36.0)
MCV: 97.6 fL (ref 78.0–100.0)
PLATELETS: 169 10*3/uL (ref 150–400)
RBC: 3.71 MIL/uL — ABNORMAL LOW (ref 4.22–5.81)
RDW: 13.7 % (ref 11.5–15.5)
WBC: 5.7 10*3/uL (ref 4.0–10.5)

## 2015-07-13 LAB — BASIC METABOLIC PANEL
Anion gap: 5 (ref 5–15)
BUN: 16 mg/dL (ref 6–20)
CHLORIDE: 109 mmol/L (ref 101–111)
CO2: 26 mmol/L (ref 22–32)
CREATININE: 1.21 mg/dL (ref 0.61–1.24)
Calcium: 8.8 mg/dL — ABNORMAL LOW (ref 8.9–10.3)
GFR calc Af Amer: >60 mL/min (ref >60)
GFR calc non Af Amer: 56 mL/min — ABNORMAL LOW (ref >60)
Glucose, Bld: 123 mg/dL — ABNORMAL HIGH (ref 65–99)
Potassium: 3.9 mmol/L (ref 3.5–5.1)
SODIUM: 140 mmol/L (ref 135–145)

## 2015-07-13 LAB — GLUCOSE, CAPILLARY: Glucose-Capillary: 77 mg/dL (ref 65–99)

## 2015-07-13 NOTE — Progress Notes (Signed)
Utilization Review Completed.Ferol Laiche T12/11/2014  

## 2015-07-13 NOTE — Progress Notes (Signed)
  Echocardiogram 2D Echocardiogram has been performed.  Delcie RochENNINGTON, Acel Natzke 07/13/2015, 11:13 AM

## 2015-07-13 NOTE — Progress Notes (Signed)
Family Medicine Teaching Service Daily Progress Note Intern Pager: [phone]  Patient name: Steve BlackerGeorge T Gordon Medical record number: [id number] Date of birth: 12/22/1935 Age: 79 y.o. Gender: male  Primary Care Provider: No PCP Per Patient Consultants: EP cardiology  Code Status: FULL   Pt Overview and Major Events to Date:  12/3: Patient admitted for syncopal episode   Assessment and Plan: Steve BlackerGeorge T Gordon is a 79 y.o. male presenting with a syncopal episode. PMH is significant for CAD s/p 4V CABG 2003 (LIMA to LAD, SVG to Diagonal, SVG to OM, SVG to PDA) and stent placement, HLD, HTN, and statin intolerat  Syncope: From prodromal symptoms this sounds like vasovagal vs orthostatic hypotension (however negative on admission) vs arrhythmia (however with prodromal symptoms this may be less likely). Patient does have significant cardiac history. Initial EKG was not concerning for ischemia or infarct. ISTAT Troponin 0.01. Troponin then 0.05 > 0.15. Repeat EKG with no new ST segment depression or elevation.  - telemetry  - last troponin pending, if not down-trending will order another set to follow  - f/u TSH: WNL  - echocardiogram today -EP cards consult given significant CAD history and syncope order set  Dyspnea on exertion: D-dimer 0.40. Hgb 12.1, slightly down from 12.9 previously. CXR without evidence of infection, no cough or other respiratory symptoms. Has been 98-100% on RA.  - continuous pulse ox - [place] as needed to keep O2 >90%.  Decreased left-sided sensation in ED: No difference in sensation on my exam. No neurologic deficits noted on my exam. CT head wo contrast negative for acute changes. Partial opacification of right sphenoid sinus, consistent with sinusitis.   CVD Follow[facility] with Cardiology[facility] Last seen 02/13/2015. Has extensive PMH with CABG and stents. No chest pain currently.  - Continue home plavix 75 mg daily, asa 81  HLD - Statin intolerance - in the  future, could consider PSK-9 medication with lipid clinic given significant cardiac history.   HTN- Currently stable BPs.  - continue home amlodipine 10 mg daily   Elevated SCr, Resolved:  1.44 at admission, back at baseline with Cr of 1.21 (12/4). - IV saline lock; if poor hydration consider IVFs.   FEN/GI: IV saline lock, heart healthy Prophylaxis: Lovenox SQ   Disposition: Home pending cardiology work up   Subjective:  Feeling well this morning. Denies chest pain. Has not had any further episodes of dizziness but has not been ambulating on his own either.   Objective: Temp:  [97.7 F (36.5 C)-98 F (36.7 C)] 97.8 F (36.6 C) (12/04 0532) Pulse Rate:  [60-79] 65 (12/04 0532) Resp:  [8-18] 14 (12/04 0532) BP: (123-167)/(54-85) 140/78 mmHg (12/04 0532) SpO2:  [97 %-100 %] 99 % (12/04 0532) Weight:  [148 lb (67.132 kg)-148 lb 8 oz (67.359 kg)] 148 lb 8 oz (67.359 kg) (12/03 2124) Physical Exam: General: lying comfortably in bed, in NAD  Cardiovascular: RRR. No murmurs.  Respiratory: CTAB. Normal WOB.  Abdomen: +bs, soft, NTND  Extremities: 1+ pitting edema on R LE, trace pitting edema on L. R calf > L calf in size which is stable   Laboratory:  Recent Labs Lab 07/12/15 1542 07/13/15 0220  WBC 7.6 5.7  HGB 12.1* 11.9*  HCT 36.8* 36.2*  PLT 176 169    Recent Labs Lab 07/12/15 1542 07/13/15 0220  NA 141 140  K 4.3 3.9  CL 111 109  CO2 24 26  BUN 21* 16  CREATININE 1.44* 1.21  CALCIUM 8.9  8.8*  GLUCOSE 109* 123*   iStat Troponin 0.01  Troponin: 0.05 >> 0.15  TSH 0.929    Imaging/Diagnostic Tests: Ct Head Wo Contrast  07/12/2015  CLINICAL DATA:  Syncopal episodes, weakness, dizziness. EXAM: CT HEAD WITHOUT CONTRAST TECHNIQUE: Contiguous axial images were obtained from the base of the skull through the vertex without intravenous contrast. COMPARISON:  01/19/2011 FINDINGS: Brain: No evidence of acute infarction, hemorrhage, extra-axial collection,  ventriculomegaly, or mass effect. There is moderate brain parenchymal atrophy and chronic small vessel disease changes. Vascular: No hyperdense vessel. Vascular calcifications are noted at the skullbase. Skull: Negative for fracture or focal lesion. Sinuses/Orbits: There is partial opacification with air-fluid level within the right sphenoid sinus. Other: None. IMPRESSION: No acute intracranial abnormality. Atrophy, chronic microvascular disease. Right sphenoid sinusitis. Electronically Signed   By: Ted Mcalpine M.D.   On: 07/12/2015 18:10   Dg Chest Port 1 View  07/12/2015  CLINICAL DATA:  Severe weakness and shortness of breath today. EXAM: PORTABLE CHEST 1 VIEW COMPARISON:  01/19/2011. FINDINGS: Normal sized heart. Stable post CABG changes. Clear lungs with normal vascularity. Thoracic spine degenerative changes. IMPRESSION: No acute abnormality. Electronically Signed   By: Beckie Salts M.D.   On: 07/12/2015 16:22    Arvilla Market, DO 07/13/2015, 9:13 AM PGY-1, [facility] FPTS Intern pager: [phone], text pages welcome

## 2015-07-14 ENCOUNTER — Other Ambulatory Visit: Payer: Self-pay | Admitting: Physician Assistant

## 2015-07-14 ENCOUNTER — Encounter (HOSPITAL_COMMUNITY): Payer: Self-pay | Admitting: Physician Assistant

## 2015-07-14 DIAGNOSIS — R55 Syncope and collapse: Secondary | ICD-10-CM

## 2015-07-14 LAB — GLUCOSE, CAPILLARY: Glucose-Capillary: 95 mg/dL (ref 65–99)

## 2015-07-14 MED ORDER — AMLODIPINE BESYLATE 10 MG PO TABS: 10.0000 mg | ORAL_TABLET | Freq: Every day | ORAL | Status: DC

## 2015-07-14 NOTE — Progress Notes (Signed)
Pt has orders to be discharged. Discharge instructions given and pt has no additional questions at this time. Medication regimen reviewed and pt educated. Pt verbalized understanding and has no additional questions. Telemetry box removed. IV removed and site in good condition. Pt stable and waiting for transportation.   Chayce Robbins RN 

## 2015-07-14 NOTE — Progress Notes (Signed)
Family Medicine Teaching Service Daily Progress Note Intern Pager: [phone]  Patient name: Steve Gordon Medical record number: [id number] Date of birth: 1936/08/07 Age: 79 y.o. Gender: male  Primary Care Provider: No PCP Per Patient Consultants: EP cardiology  Code Status: FULL   Pt Overview and Major Events to Date:  12/3: Patient admitted for syncopal episode   Assessment and Plan: Steve Gordon is a 79 y.o. male presenting with a syncopal episode. PMH is significant for CAD s/p 4V CABG 2003 (LIMA to LAD, SVG to Diagonal, SVG to OM, SVG to PDA) and stent placement, HLD, HTN, and statin intolerat  Syncope: From prodromal symptoms this sounds like vasovagal vs orthostatic hypotension (however negative on admission) vs arrhythmia (however with prodromal symptoms this may be less likely). Patient does have significant cardiac history. Initial EKG was not concerning for ischemia or infarct. ISTAT Troponin 0.01. Troponin then 0.05 > 0.15> 0.14 >0.09. Repeat EKG with no new ST segment depression or elevation. Echo: LVEF 55-60%, Grade II diastolic dysfunction, mild MV regurgitation.  - telemetry  - echocardiogram today -EP cards consult given significant CAD history and syncope order set  Dyspnea on exertion: D-dimer 0.40. Hgb 12.1, slightly down from 12.9 previously. CXR without evidence of infection, no cough or other respiratory symptoms. Has been 98-100% on RA.  - continuous pulse ox - [place] as needed to keep O2 >90%.  Decreased left-sided sensation in ED: No difference in sensation on my exam. No neurologic deficits noted on my exam. CT head wo contrast negative for acute changes. Partial opacification of right sphenoid sinus, consistent with sinusitis.   CVD Follows with Cardiology, Dr. Clifton James [facility]). Last seen 02/13/2015. Has extensive PMH with CABG and stents. No chest pain currently.  - Continue home plavix 75 mg daily, asa 81  HLD - Statin intolerance - in the future, could  consider PSK-9 medication with lipid clinic given significant cardiac history.   HTN- Currently stable BPs.  - continue home amlodipine 10 mg daily   Elevated SCr, Resolved:  1.44 at admission, back at baseline with Cr of 1.21 (12/4). - IV saline lock  FEN/GI: IV saline lock, heart healthy Prophylaxis: Lovenox SQ   Disposition: Home pending cardiology work up   Subjective:  Feeling well this morning.Ambulating without dizziness. Denies chest pain.   Objective: Temp:  [97.4 F (36.3 C)-97.8 F (36.6 C)] 97.8 F (36.6 C) (12/05 0502) Pulse Rate:  [56-63] 58 (12/05 0502) Resp:  [17-20] 20 (12/05 0502) BP: (105-131)/(57-74) 124/74 mmHg (12/05 0502) SpO2:  [96 %-99 %] 99 % (12/05 0502) Weight:  [149 lb (67.586 kg)] 149 lb (67.586 kg) (12/05 0502) Physical Exam: General: standing in room, in NAD  Cardiovascular: RRR. Intermittent PVCs noted on auscultation. No murmurs appreciated.  Respiratory: CTAB. Normal WOB.  Abdomen: +bs, soft, NTND  Extremities: 1+ pitting edema on R LE, trace pitting edema on L. R calf > L calf in size which is stable   Laboratory:  Recent Labs Lab 07/12/15 1542 07/13/15 0220  WBC 7.6 5.7  HGB 12.1* 11.9*  HCT 36.8* 36.2*  PLT 176 169    Recent Labs Lab 07/12/15 1542 07/13/15 0220  NA 141 140  K 4.3 3.9  CL 111 109  CO2 24 26  BUN 21* 16  CREATININE 1.44* 1.21  CALCIUM 8.9 8.8*  GLUCOSE 109* 123*   iStat Troponin 0.01  Troponin: 0.05 >> 0.15>>0.14>>0.09  TSH 0.929    Imaging/Diagnostic Tests: Ct Head Wo Contrast  07/12/2015  CLINICAL DATA:  Syncopal episodes, weakness, dizziness. EXAM: CT HEAD WITHOUT CONTRAST TECHNIQUE: Contiguous axial images were obtained from the base of the skull through the vertex without intravenous contrast. COMPARISON:  01/19/2011 FINDINGS: Brain: No evidence of acute infarction, hemorrhage, extra-axial collection, ventriculomegaly, or mass effect. There is moderate brain parenchymal atrophy and chronic  small vessel disease changes. Vascular: No hyperdense vessel. Vascular calcifications are noted at the skullbase. Skull: Negative for fracture or focal lesion. Sinuses/Orbits: There is partial opacification with air-fluid level within the right sphenoid sinus. Other: None. IMPRESSION: No acute intracranial abnormality. Atrophy, chronic microvascular disease. Right sphenoid sinusitis. Electronically Signed   By: Ted Mcalpineobrinka  Dimitrova M.D.   On: 07/12/2015 18:10   Dg Chest Port 1 View  07/12/2015  CLINICAL DATA:  Severe weakness and shortness of breath today. EXAM: PORTABLE CHEST 1 VIEW COMPARISON:  01/19/2011. FINDINGS: Normal sized heart. Stable post CABG changes. Clear lungs with normal vascularity. Thoracic spine degenerative changes. IMPRESSION: No acute abnormality. Electronically Signed   By: Beckie SaltsSteven  Reid M.D.   On: 07/12/2015 16:22    Arvilla Marketatherine Lauren Marvette Schamp, DO 07/14/2015, 6:59 AM PGY-1, [facility] FPTS Intern pager: [phone], text pages welcome

## 2015-07-14 NOTE — Consult Note (Signed)
CARDIOLOGY CONSULT NOTE   Patient ID: Steve Gordon MRN: [id number] DOB/AGE: 77/03/1936 77 y.o.  Admit date: 07/12/2015  Primary Physician   No PCP Per Patient Primary Cardiologist   Dr Stuckey>>Dr Clifton JamesMcAlhany  Reason for Consultation   CHF  WUJ:WJXBJYHPI:Damany T Vassie MomentHolton is a 79 y.o. year old male with a history of 4V CABG 2003 (LIMA to LAD, SVG to D1, SVG to OM, SVG to PDA), BMS RCA 2008, cath 2011 w/ LIMA-LAD atretic, SVG-OM & SVG-PDA 100%, s/p DES CFX-OM, DES OM 2014 for ISR, HLD with statin intolerance, bradycardia so not on BB.   Seen by Dr Clifton JamesMcAlhany 02/2015 and doing well, weight 149 lbs.  For 6-8 weeks, he has been feeling that he was getting tired more so than usual. The fatigue did not keep him from working, building things around the house and working in the yard, but he rested more often.  3 weeks ago, pt had light-headed episode when got up to BR during the night.    12/03 was doing OK. He was working on his model T Tour managerradiator, but did not feel it was strenuous. He ate lunch and did a little more work on the car. After he finished, he felt tired and sat on the porch.   Per wife, he began staring and his eyes rolled back. He did not respond to shaking or calling his name. 911 called. Pt remembers her calling 911 and was awake after that, but was still a little "out of it" when EMS got there. When he woke, he knew who and where he was. However he felt a little weak and "hungover" at first.   He felt a little numb and tingly all over. No chest pain, SOB or palpitations. Per pt, EMS said his HR was irregular but no documentation of any specific arrhythmia/HR. Initial ECG w/ PACs. Never had symptoms like this before.    Past Medical History  Diagnosis Date  . CAD (coronary artery disease)     a. Ant MI 1998;  b. 2003: s/p CABG x 4 (VG->Diag, VG->OM, VG->PDA, LIMA->LAD);  c. PCI 2008: RCA - 4.0x16 Liberte BMS;  d. 07/2010 PCI: OM1- 3.5x20 Promus DES;  e. 03/2013 Cath/PCI: LM nl, LAD  60-70p/m (FFR 0.9->Med Rx), Diag 100, LCX patent prox stent, OM1 95-90(3.0x32 Promus Premier DES), RCA dom, 5442m, 30 ISR, VG->Diag ok, VG->OM KTBO, VG->PDA KTBO, LIMA->LAD known atresia, EF 55%  . Dyslipidemia   . Statin intolerance   . Hypertension      Past Surgical History  Procedure Laterality Date  . Laparoscopic incisional / umbilical / ventral hernia repair  2004  . Bronchogenic cyst resection  08/06/1997  . Inguinal hernia repair Right     "3 times" (03/26/2013)  . Cholecystectomy    . Ankle fracture surgery Left ~ 1999    "broke in 1993; casted; PCP said not broken; had MI; walked for rehab; wore ankle out; put in plates and screws" (03/26/2013)  . Coronary artery bypass graft  2003    x4 LIMA to LAD SVG digonal, OM, PDA  . Coronary angioplasty with stent placement  1998-03/26/2013    "2 at one time in 1998; this one  today is the 3rd one I've had since OHS in 2003" (03/26/2013)  . Coronary angioplasty with stent placement  10/05/2006    4.790mmx 16mm Liberte non DES  . Left heart catheterization with coronary/graft angiogram N/A 03/26/2013    Procedure: LEFT HEART CATHETERIZATION WITH CORONARY/GRAFT  ANGIOGRAM;  Surgeon: Kathleene Hazel, MD;  Location[facility] CATH LAB;  Service: Cardiovascular;  Laterality: N/A;    No Known Allergies  I have reviewed the patient's current medications . amLODipine  10 mg Oral Daily  . aspirin EC  81 mg Oral QHS  . clopidogrel  75 mg Oral Daily  . enoxaparin (LOVENOX) injection  40 mg Subcutaneous Q24H  . sodium chloride  3 mL Intravenous Q12H  . sodium chloride  3 mL Intravenous Q12H     sodium chloride, sodium chloride  Prior to Admission medications   Medication Sig Start Date End Date Taking? Authorizing Provider  amLODipine (NORVASC) 10 MG tablet Take 1 tablet (10 mg total) by mouth daily. 02/13/15  Yes Kathleene Hazel, MD  aspirin EC 81 MG tablet Take 81 mg by mouth at bedtime.   Yes Historical Provider, MD  clopidogrel (PLAVIX)  75 MG tablet Take 1 tablet (75 mg total) by mouth daily. 02/13/15  Yes Kathleene Hazel, MD  nitroGLYCERIN (NITROSTAT) 0.4 MG SL tablet Place 1 tablet (0.4 mg total) under the tongue every 5 (five) minutes as needed for chest pain. 02/13/15  Yes Kathleene Hazel, MD     Social History   Social History  . Marital Status: Married    Spouse Name: N/A  . Number of Children: N/A  . Years of Education: N/A   Occupational History  . Retired, but works on Forensic scientist    Social History Main Topics  . Smoking status: Never Smoker   . Smokeless tobacco: Never Used  . Alcohol Use: Yes     Comment: 03/26/2013 "couple shots some weeks; none for 2 months; just take a drink q now and then"  . Drug Use: No  . Sexual Activity: Yes   Other Topics Concern  . Not on file   Social History Narrative   Lives in [place] with wife. 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    Family Status  Relation Status Death Age  . Mother Deceased   . Father Deceased    Family History  Problem Relation Age of Onset  . Hypertension Other   . Heart disease Other   . Kidney cancer Other      ROS:  Full 14 point review of systems complete and found to be negative unless listed above.  Physical Exam: Blood pressure 121/63, pulse 50, temperature 97.9 F (36.6 C), temperature source Oral, resp. rate 20, height 5\' 7"  (1.702 m), weight 149 lb (67.586 kg), SpO2 100 %.  General: Well developed, well nourished, male in no acute distress Head: Eyes PERRLA, No xanthomas.   Normocephalic and atraumatic, oropharynx without edema or exudate. Dentition: poor Lungs: clear bilaterally Heart: Heart irregular at times rate and rhythm with S1, S2, no murmur. pulses are 2+ all 4 extrem.   Neck: No carotid bruits. No lymphadenopathy.  JVD not elevated. Abdomen: Bowel sounds present, abdomen soft and non-tender without masses or hernias noted. Msk:  No spine or cva tenderness. No weakness, no joint deformities or  effusions. Extremities: No clubbing or cyanosis. No edema.  Neuro: Alert and oriented X 3. No focal deficits noted. Psych:  Good affect, responds appropriately Skin: No rashes or lesions noted.  Labs:   Lab Results  Component Value Date   WBC 5.7 07/13/2015   HGB 11.9* 07/13/2015   HCT 36.2* 07/13/2015   MCV 97.6 07/13/2015   PLT 169 07/13/2015     Recent Labs Lab 07/13/15 0220  NA 140  K 3.9  CL 109  CO2 26  BUN 16  CREATININE 1.21  CALCIUM 8.8*  GLUCOSE 123*    Recent Labs  07/12/15 2148 07/13/15 0220 07/13/15 0910 07/13/15 1610  TROPONINI 0.05* 0.15* 0.14* 0.09*    Recent Labs  07/12/15 1546  TROPIPOC 0.01   Lab Results  Component Value Date   DDIMER 0.40 07/12/2015   TSH  Date/Time Value Ref Range Status  07/12/2015 09:44 PM 0.929 0.350 - 4.500 uIU/mL Final  08/06/2010 07:39 PM 0.652 0.350 - 4.500 uIU/mL Final   Echo: 07/13/2015 - Left ventricle: The cavity size was normal. Wall thickness was normal. Systolic function was normal. The estimated ejection fraction was in the range of 55% to 60%. Wall motion was normal; there were no regional wall motion abnormalities. Features are consistent with a pseudonormal left ventricular filling pattern, with concomitant abnormal relaxation and increased filling pressure (grade 2 diastolic dysfunction). - Aortic valve: Trileaflet; mildly thickened, mildly calcified leaflets. - Mitral valve: There was mild regurgitation. - Left atrium: The atrium was mildly dilated. - Right atrium: The atrium was mildly dilated.  ECG:  07/13/2015 Sinus brady, rate 59 No acute changes  Radiology:  Ct Head Wo Contrast 07/12/2015  CLINICAL DATA:  Syncopal episodes, weakness, dizziness. EXAM: CT HEAD WITHOUT CONTRAST TECHNIQUE: Contiguous axial images were obtained from the base of the skull through the vertex without intravenous contrast. COMPARISON:  01/19/2011 FINDINGS: Brain: No evidence of acute infarction,  hemorrhage, extra-axial collection, ventriculomegaly, or mass effect. There is moderate brain parenchymal atrophy and chronic small vessel disease changes. Vascular: No hyperdense vessel. Vascular calcifications are noted at the skullbase. Skull: Negative for fracture or focal lesion. Sinuses/Orbits: There is partial opacification with air-fluid level within the right sphenoid sinus. Other: None. IMPRESSION: No acute intracranial abnormality. Atrophy, chronic microvascular disease. Right sphenoid sinusitis. Electronically Signed   By: Ted Mcalpine M.D.   On: 07/12/2015 18:10   Dg Chest Port 1 View 07/12/2015  CLINICAL DATA:  Severe weakness and shortness of breath today. EXAM: PORTABLE CHEST 1 VIEW COMPARISON:  01/19/2011. FINDINGS: Normal sized heart. Stable post CABG changes. Clear lungs with normal vascularity. Thoracic spine degenerative changes. IMPRESSION: No acute abnormality. Electronically Signed   By: Beckie Salts M.D.   On: 07/12/2015 16:22    ASSESSMENT AND PLAN:   The patient was seen today by Dr Excell Seltzer, the patient evaluated and the data reviewed.  Active Problems:   Faintness - unclear cause, orthostatic VS were negative - has had bradycardia w/ HR 50s for years - does not routinely have presyncope - no rate-lowering medications - MD advise on event monitor - w/ no documented significant bradycardia, no indication for PPM    Generalized weakness - per IM    Essential hypertension - good control - per IM    Coronary artery disease involving native coronary artery of native heart without angina pectoris - No ischemic sx - EF normal and no WMA on echo - no volume overload by exam - no clear reason for elevated troponin - Dr Clifton James has told pt no need for stress test, if he has sx, cath him - had chest pain prior to PCI/CABG, no sig pain recently    Hyperlipidemia - statin intolerant, f/u with Dr Clifton James    Elevated serum creatinine - in line w/ previous values,  GFR >/=45 - per IM  SignedTheodore Demark, PA-C 07/14/2015 9:54 AM Beeper [phone]  Co-Sign MD  Patient seen, examined. Available  data reviewed. Agree with findings, assessment, and plan as outlined by Theodore Demark, PA-C. The patient is independently interviewed and examined. His wife is at the bedside. On exam, he is alert and oriented, in no distress. Lungs are clear. Heart is regular rate and rhythm without murmur. Abdomen is soft and nontender. There is no peripheral edema present. EKG shows no evidence of significant conduction disease. Echocardiogram is reviewed and it is essentially unremarkable with normal LV function and no significant valvular disease. The patient has long-standing sinus bradycardia, but no evidence of significant heart block. Telemetry is reviewed and shows no arrhythmia. I think the most likely etiology of his syncopal episode is vasovagal mediated. However, it is reasonable to evaluate further for arrhythmia with a 2 week event monitor. This will be arranged. From a cardiac perspective, the patient appears stable for hospital discharge. Follow-up will be arranged after his event monitor is completed. All questions are answered today.  Tonny Bollman, M.D. 07/14/2015 1:15 PM

## 2015-07-14 NOTE — Discharge Summary (Signed)
[facility] Dis[facility] Summary  Patient name: Steve BlackerGeorge T Kindler Medical record number: [id number] Date of birth: 06/11/1936 Age: 79 y.o. Gender: male Date of Admission: 07/12/2015  Date of Discharge: 07/14/2015 Admitting Physician: Uvaldo RisingKyle J Fletke, MD  Primary Care Provider: No PCP Per Patient Consultants: Cardiology   Indication for Hospitalization: Syncopal Episode   Discharge Diagnoses/Problem List:  Patient Active Problem List   Diagnosis Date Noted  . Generalized weakness   . Essential hypertension   . Coronary artery disease involving native coronary artery of native heart without angina pectoris   . Hyperlipidemia   . Elevated serum creatinine   . Faintness 07/12/2015  . Unstable angina (HCC) 03/27/2013  . Sinus bradycardia 12/14/2011  . Orthostatic hypotension 03/21/2011  . Incidental lung nodule, > 3mm and < 8mm 11/25/2010  . Coronary artery disease 11/25/2010  . METABOLIC ACIDOSIS 08/26/2010  . HYPERTENSION, BENIGN 08/26/2010  . HYPERCHOLESTEROLEMIA  IIA 03/07/2009  . CAD, ARTERY BYPASS GRAFT 03/07/2009    Disposition: Home  Discharge Condition: Stable   Discharge Exam:  General: standing in room, in NAD  Cardiovascular: RRR. Intermittent PVCs noted on auscultation. No murmurs appreciated.  Respiratory: CTAB. Normal WOB.  Abdomen: +bs, soft, NTND  Extremities: 1+ pitting edema on R LE, trace pitting edema on L. R calf > L calf in size which is stable   Brief Hospital Course:  Steve Gordon is 79 y.o. male with PMH of CAD s/p CABG in 2003, multiple PCI, HTN. HLD, and statin intolerance who presented with syncopal episode at home. He did have LOC for a short period of time per wife. Patient reported occasional dizziness x 2 weeks with no associated chest pain or SOB. CT head was negative for acute changes. Patient admitted for syncopal workup including echo and ACS r/o.   Cardiology was consulted. Initial troponins peaked at 0.15 but  then trended down. EKGs did not show any evidence of significant conduction disease and were non-concerning for ischemia or infarction. Patient was on telemetry during hospital course without arrhythmia. Echo showed normal LV function and no significant valvular disease. Syncope was felt to likely be vasovagal in nature. However, cardiology felt it was reasonable to evaluate further for arrhythmia and planned to arrange for a 2 week event monitor. Patient had no further episodes of dizziness during hospitalization and was ambulating without issue. He was felt to be medically stable for discharge with follow up.   Issues for Follow Up:  1. Follow up with Cardiology Clifton James([facility]) regarding statin intolerance  2. Cardiology to arrange for 2 week event monitor  Significant Procedures: None   Significant Labs and Imaging:   Recent Labs Lab 07/12/15 1542 07/13/15 0220  WBC 7.6 5.7  HGB 12.1* 11.9*  HCT 36.8* 36.2*  PLT 176 169    Recent Labs Lab 07/12/15 1542 07/13/15 0220  NA 141 140  K 4.3 3.9  CL 111 109  CO2 24 26  GLUCOSE 109* 123*  BUN 21* 16  CREATININE 1.44* 1.21  CALCIUM 8.9 8.8*   iStat Troponin 0.01  Troponin: 0.05 >> 0.15>>0.14>>0.09  TSH 0.929   Ct Head Wo Contrast  07/12/2015  CLINICAL DATA:  Syncopal episodes, weakness, dizziness. EXAM: CT HEAD WITHOUT CONTRAST TECHNIQUE: Contiguous axial images were obtained from the base of the skull through the vertex without intravenous contrast. COMPARISON:  01/19/2011 FINDINGS: Brain: No evidence of acute infarction, hemorrhage, extra-axial collection, ventriculomegaly, or mass effect. There is moderate brain parenchymal atrophy and chronic small vessel  disease changes. Vascular: No hyperdense vessel. Vascular calcifications are noted at the skullbase. Skull: Negative for fracture or focal lesion. Sinuses/Orbits: There is partial opacification with air-fluid level within the right sphenoid sinus. Other: None. IMPRESSION: No acute  intracranial abnormality. Atrophy, chronic microvascular disease. Right sphenoid sinusitis. Electronically Signed   By: Ted Mcalpine M.D.   On: 07/12/2015 18:10   Dg Chest Port 1 View  07/12/2015  CLINICAL DATA:  Severe weakness and shortness of breath today. EXAM: PORTABLE CHEST 1 VIEW COMPARISON:  01/19/2011. FINDINGS: Normal sized heart. Stable post CABG changes. Clear lungs with normal vascularity. Thoracic spine degenerative changes. IMPRESSION: No acute abnormality. Electronically Signed   By: Beckie Salts M.D.   On: 07/12/2015 16:22    Results/Tests Pending at Time of Discharge: None   Discharge Medications:    Medication List    TAKE these medications        amLODipine 10 MG tablet  Commonly known as:  NORVASC  Take 1 tablet (10 mg total) by mouth daily.     aspirin EC 81 MG tablet  Take 81 mg by mouth at bedtime.     clopidogrel 75 MG tablet  Commonly known as:  PLAVIX  Take 1 tablet (75 mg total) by mouth daily.     nitroGLYCERIN 0.4 MG SL tablet  Commonly known as:  NITROSTAT  Place 1 tablet (0.4 mg total) under the tongue every 5 (five) minutes as needed for chest pain.        Discharge Instructions: Please refer to Patient Instructions section of EMR for full details.  Patient was counseled important signs and symptoms that should prompt return to medical care, changes in medications, dietary instructions, activity restrictions, and follow up appointments.   Follow-Up Appointments: Follow-up Information    [facility] with [facility]. Go on 07/22/2015.   Specialty:  Family Medicine   Why:  For Hospital Followup; 11 am with Dr. Patrica Duel information:   [address] [id number] mc [address] [place] [address] [phone]      Arvilla Market, DO 07/14/2015, 9:54 PM PGY-1, [facility]

## 2015-07-14 NOTE — Discharge Instructions (Signed)
You were hospitalized for an episode of syncope (passing out). You had a work up to make sure this episode was not cardiac in nature. Your work up was negative for anything concerning for an heart attack or anything else urgent with your heart. Please follow up with your outpatient cardiologist and please [facility]. Seek medical care if you pass out again, have chest pain, or have difficulty breathing.

## 2015-07-18 ENCOUNTER — Ambulatory Visit (INDEPENDENT_AMBULATORY_CARE_PROVIDER_SITE_OTHER): Payer: BLUE CROSS/BLUE SHIELD

## 2015-07-18 DIAGNOSIS — R55 Syncope and collapse: Secondary | ICD-10-CM | POA: Diagnosis not present

## 2015-07-22 ENCOUNTER — Encounter: Payer: Self-pay | Admitting: Family Medicine

## 2015-07-22 ENCOUNTER — Ambulatory Visit (INDEPENDENT_AMBULATORY_CARE_PROVIDER_SITE_OTHER): Payer: BLUE CROSS/BLUE SHIELD | Admitting: Family Medicine

## 2015-07-22 VITALS — BP 149/67 | HR 61 | Temp 97.9°F | Ht 67.0 in | Wt 153.0 lb

## 2015-07-22 DIAGNOSIS — I2510 Atherosclerotic heart disease of native coronary artery without angina pectoris: Secondary | ICD-10-CM

## 2015-07-22 DIAGNOSIS — R55 Syncope and collapse: Secondary | ICD-10-CM

## 2015-07-22 NOTE — Patient Instructions (Signed)
Thank you for coming in,   Please follow up with us if you need us in the future.   Sign up for My Chart to have easy access to your labs results, and communication with your Primary care physician   Please feel free to call with any questions or concerns at any time, at [phone]. --Dr. Jordan LikesSchmitz

## 2015-07-22 NOTE — Progress Notes (Signed)
   Subjective:    Steve Gordon - 79 y.o. male MRN [id number]  Date of birth: 12/13/1935  HPI  Steve Gordon is here for hospital follow up.   Syncope:  Was placed with a Holter monitor  Has been asymptomatic since discharge.  Feeling well today Hx of MI in 1998 Hasn't seen a PCP since before 1998   Health Maintenance:  Health Maintenance Due  Topic Date Due  . TETANUS/TDAP  10/14/1956  . ZOSTAVAX  10/14/1997  . PNA vac Low Risk Adult (1 of 2 - PCV13) 10/15/2002  . INFLUENZA VACCINE  03/10/2015    -  reports that he has never smoked. He has never used smokeless tobacco. - Review of Systems: Per HPI. - Past Medical History: Patient Active Problem List   Diagnosis Date Noted  . Coronary artery disease involving native coronary artery of native heart without angina pectoris   . Hyperlipidemia   . Syncope 07/12/2015  . Sinus bradycardia 12/14/2011  . Incidental lung nodule, > 3mm and < 8mm 11/25/2010  . Coronary artery disease 11/25/2010  . HYPERTENSION, BENIGN 08/26/2010  . HYPERCHOLESTEROLEMIA  IIA 03/07/2009  . CAD, ARTERY BYPASS GRAFT 03/07/2009   - Medications: reviewed and updated Current Outpatient Prescriptions  Medication Sig Dispense Refill  . amLODipine (NORVASC) 10 MG tablet Take 1 tablet (10 mg total) by mouth daily. 90 tablet 3  . aspirin EC 81 MG tablet Take 81 mg by mouth at bedtime.    . clopidogrel (PLAVIX) 75 MG tablet Take 1 tablet (75 mg total) by mouth daily. 90 tablet 3  . nitroGLYCERIN (NITROSTAT) 0.4 MG SL tablet Place 1 tablet (0.4 mg total) under the tongue every 5 (five) minutes as needed for chest pain. 75 tablet 3   No current facility-administered medications for this visit.     Review of Systems See HPI     Objective:   Physical Exam BP 149/67 mmHg  Pulse 61  Temp(Src) 97.9 F (36.6 C) (Oral)  Ht 5\' 7"  (1.702 m)  Wt 153 lb (69.4 kg)  BMI 23.96 kg/m2 Gen: NAD, alert, cooperative with exam, well-appearing HEENT: NCAT, EOMI,  clear conjunctiva,  CV: RRR, good S1/S2, no murmur, no edema,  Resp: CTABL, no wheezes, non-labored Skin: no rashes, normal turgor  Neuro: no gross deficits.  Psych:  alert and oriented        Assessment & Plan:   Syncope Asymptomatic since discharge He is followed with cardiology Holter monitor and place

## 2015-07-23 NOTE — Assessment & Plan Note (Signed)
Asymptomatic since discharge He is followed with cardiology Holter monitor and place

## 2015-10-13 ENCOUNTER — Ambulatory Visit (INDEPENDENT_AMBULATORY_CARE_PROVIDER_SITE_OTHER): Payer: BLUE CROSS/BLUE SHIELD | Admitting: Cardiovascular Disease

## 2015-10-13 ENCOUNTER — Encounter: Payer: Self-pay | Admitting: Cardiovascular Disease

## 2015-10-13 VITALS — BP 122/72 | HR 60 | Ht 67.0 in | Wt 153.0 lb

## 2015-10-13 DIAGNOSIS — E785 Hyperlipidemia, unspecified: Secondary | ICD-10-CM | POA: Diagnosis not present

## 2015-10-13 DIAGNOSIS — I2510 Atherosclerotic heart disease of native coronary artery without angina pectoris: Secondary | ICD-10-CM

## 2015-10-13 DIAGNOSIS — I10 Essential (primary) hypertension: Secondary | ICD-10-CM

## 2015-10-13 DIAGNOSIS — R001 Bradycardia, unspecified: Secondary | ICD-10-CM

## 2015-10-13 MED ORDER — CLOPIDOGREL BISULFATE 75 MG PO TABS: 75.0000 mg | ORAL_TABLET | Freq: Every day | ORAL | Status: DC

## 2015-10-13 MED ORDER — NITROGLYCERIN 0.4 MG SL SUBL: 0.4000 mg | SUBLINGUAL_TABLET | SUBLINGUAL | Status: DC | PRN

## 2015-10-13 MED ORDER — AMLODIPINE BESYLATE 10 MG PO TABS: 10.0000 mg | ORAL_TABLET | Freq: Every day | ORAL | Status: DC

## 2015-10-13 NOTE — Patient Instructions (Signed)

## 2015-10-13 NOTE — Progress Notes (Signed)
Chief Complaint  Patient presents with  . Follow-up  . Hypertension      History of Present Illness: 80 yo male with history of CAD s/p 4V CABG 2003 (LIMA to LAD, SVG to Diagonal, SVG to OM, SVG to PDA), HLD with statin intolerance who is here today for cardiac follow up. He has been followed in the past by Dr. Riley Kill. He had an anterior MI in 1998 at which time a bare metal stent was placed in the LAD. Cardiac cath 2003 with severe stenoses in the Diagonal, OM2 and PDA. He underwent 4V CABG in 2003. He then had PCI of the mid RCA in 2008 (4.0 x 16 mm Liberte bare metal stent) and PCI of the OM1 with DES in 2011 (3.5 x 20 mm Promus Premier). I saw him in August 2014 and he had chest pain c/w unstable angina. Repeat cath 03/26/13: prox to mid LAD 60-70% (unchanged), Diagonal occluded, OM1 85-90% dist to stent, mid RCA 40%, mid RCA stent patent with 30% ISR, SVG to Diagonal patent, SVG to OM known to be occluded, SVG to PDA known to be occluded, LIMA to LAD known to be atretic, EF 55%. FFR of LAD: 0.90 (not flow limiting). PCI: 3.0 x32 mm Promus premier DES to OM1 overlapping old stent. Post PCI course uneventful. 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 Echo with normal LV function and mild MR. Event monitor December 2016 with sinus brady, PACs, no pauses or high grade AV block.   He is here today for follow up. He has been doing well. No chest pain or SOB.   Primary Care Physician: None  Last Lipid Profile: Lipids followed in primary care.   Past Medical History  Diagnosis Date  . CAD (coronary artery disease)     a. Ant MI 1998;  b. 2003: s/p CABG x 4 (VG->Diag, VG->OM, VG->PDA, LIMA->LAD);  c. PCI 2008: RCA - 4.0x16 Liberte BMS;  d. 07/2010 PCI: OM1- 3.5x20 Promus DES;  e. 03/2013 Cath/PCI: LM nl, LAD 60-70p/m (FFR 0.9->Med Rx), Diag 100, LCX patent prox stent, OM1 95-90(3.0x32 Promus Premier DES), RCA dom, 54m, 30 ISR, VG->Diag ok, VG->OM KTBO,  VG->PDA KTBO, LIMA->LAD known atresia, EF 55%  . Dyslipidemia   . Statin intolerance   . Hypertension     Past Surgical History  Procedure Laterality Date  . Laparoscopic incisional / umbilical / ventral hernia repair  2004  . Bronchogenic cyst resection  08/06/1997  . Inguinal hernia repair Right     "3 times" (03/26/2013)  . Cholecystectomy    . Ankle fracture surgery Left ~ 1999    "broke in 1993; casted; PCP said not broken; had MI; walked for rehab; wore ankle out; put in plates and screws" (03/26/2013)  . Coronary artery bypass graft  2003    x4 LIMA to LAD SVG digonal, OM, PDA  . Coronary angioplasty with stent placement  1998-03/26/2013    "2 at one time in 1998; this one  today is the 3rd one I've had since OHS in 2003" (03/26/2013)  . Coronary angioplasty with stent placement  10/05/2006    4.92mmx 16mm Liberte non DES  . Left heart catheterization with coronary/graft angiogram N/A 03/26/2013    Procedure: LEFT HEART CATHETERIZATION WITH Isabel Caprice;  Surgeon: Kathleene Hazel, MD;  Location: [facility] CATH LAB;  Service: Cardiovascular;  Laterality: N/A;    Current Outpatient Prescriptions  Medication Sig Dispense Refill  . amLODipine (  NORVASC) 10 MG tablet Take 1 tablet (10 mg total) by mouth daily. 90 tablet 3  . aspirin EC 81 MG tablet Take 81 mg by mouth at bedtime.    . clopidogrel (PLAVIX) 75 MG tablet Take 1 tablet (75 mg total) by mouth daily. 90 tablet 3  . nitroGLYCERIN (NITROSTAT) 0.4 MG SL tablet Place 1 tablet (0.4 mg total) under the tongue every 5 (five) minutes as needed for chest pain. 75 tablet 3   No current facility-administered medications for this visit.    No Known Allergies  Social History   Social History  . Marital Status: Married    Spouse Name: N/A  . Number of Children: N/A  . Years of Education: N/A   Occupational History  . Retired, but works on Forensic scientistcars part-time    Social History Main Topics  . Smoking status: Never Smoker    . Smokeless tobacco: Never Used  . Alcohol Use: Yes     Comment: 03/26/2013 "couple shots some weeks; none for 2 months; just take a drink q now and then"  . Drug Use: No  . Sexual Activity: Yes   Other Topics Concern  . Not on file   Social History Narrative   Level of education: 10th grade   Employment: retired, previously self employed and truck Physiological scientistdriver    Transportation: self    Exercise: works on his cars    Housing situation: house    Relationships (safe): yes   SolicitorContact for message (voicemail): Wife [name]([name]) ok to leave information with her. [phone]   [place]. 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    Family History  Problem Relation Age of Onset  . Hypertension Other   . Heart disease Other   . Kidney cancer Other     Review of Systems:  As stated in the HPI and otherwise negative.   BP 122/72 mmHg  Pulse 60  Ht 5\' 7"  (1.702 m)  Wt 153 lb (69.4 kg)  BMI 23.96 kg/m2  SpO2 99%  Physical Examination: General: Well developed, well nourished, NAD HEENT: OP clear, mucus membranes moist SKIN: warm, dry. No rashes. Neuro: No focal deficits Musculoskeletal: Muscle strength 5/5 all ext Psychiatric: Mood and affect normal Neck: No JVD, no carotid bruits, no thyromegaly, no lymphadenopathy. Lungs:Clear bilaterally, no wheezes, rhonci, crackles Cardiovascular: Regular rate and rhythm. No murmurs, gallops or rubs. Abdomen:Soft. Bowel sounds present. Non-tender.  Extremities: No lower extremity edema. Pulses are 2 + in the bilateral DP/PT.  Echo December 2016: Left ventricle: The cavity size was normal. Wall thickness was normal. Systolic function was normal. The estimated ejection fraction was in the range of 55% to 60%. Wall motion was normal; there were no regional wall motion abnormalities. Features are consistent with a pseudonormal left ventricular filling pattern, with concomitant abnormal relaxation and increased filling pressure  (grade 2 diastolic dysfunction). - Aortic valve: Trileaflet; mildly thickened, mildly calcified leaflets. - Mitral valve: There was mild regurgitation. - Left atrium: The atrium was mildly dilated. - Right atrium: The atrium was mildly dilated.  Cardiac cath 03/26/13: PCI Note: He was given a bolus of Angiomax and a drip was started. I engaged the the left main with a XB 3.0 guiding catheter. When the ACT was over 200, I passed a BMW wire down into the OM branch. A 2.0 x 12 mm balloon was used to pre-dilate the stenosis. I then carefully positioned and deployed a 3.0 x 32 mm [employer]bridge EnergyPromus Premier  DES in the OM branch overlapping the old stent. I then post-dilated the stent with a 3.25 x 20 mm [place] balloon x 2. The stenosis was taken from 85% down to 0%.  I then turned my attention to the LAD. The proximal stented segment had evidence of at least moderate restenosis. This likely represented a 60-70% stenosis but was only minimally changed from last cath in 2011. An FFR of this vessel was 0.90 after infusion of IV adenosine for 120 seconds. This suggested that the lesion is not flow limiting.  There were no immediate complications. Plavix 300 mg po x 1 during case. An Angioseal femoral artery closure device was placed in the right femoral artery. The patient was taken to the recovery area in stable condition.  Hemodynamic Findings:  Central aortic pressure: 118/59  Left ventricular pressure: 121/8/12  Angiographic Findings:  Left main: No obstructive disease.  Left Anterior Descending Artery: Moderate to large caliber vessel that courses to the apex. The proximal to mid vessel stented segment with 60-70% restenosis, grossly unchanged from last cath in 2011. The distal vessel has diffuse mild stenosis. The diagonal branch is occluded but fills from the patent vein graft.  Circumflex Artery: Large caliber vessel with patent stent in proximal vessel extending into OM1. The first OM branch has a hazy, 85-90%  stenosis distal to the stent. The AV groove Circumflex is small in caliber and has mild plaque disease.  Right Coronary Artery: Large dominant vessel with 40% mid stenosis, patent mid stent with 30% restenosis, mild distal disease.  Graft Anatomy:  SVG to Diagonal is patent  SVG to OM known to be occluded and not injected  SVG to PDA known to be occluded and not injected  LIMA to mid LAD is known to be atretic and not injected  Left Ventricular Angiogram: LVEF=55%. 1+ MR  Impression:  1. Triple vessel CAD s/p CABG with 1/4 patent grafts, patent stents LAD and RCA.  2. Unstable angina (class III) secondary to severe stenosis OM1  3. Successful PTCA/DES x 1 OM1  4. Moderate disease stented segment of proximal LAD, FFR of 0.90 suggesting this lesion is not flow limiting.  5. Preserved LV systolic function  EKG:  EKG is ordered today. The ekg ordered today demonstrates sinus brady, rate 58 bpm  Recent Labs: 07/12/2015: TSH 0.929 07/13/2015: BUN 16; Creatinine, Ser 1.21; Hemoglobin 11.9*; Platelets 169; Potassium 3.9; Sodium 140   Lipid Panel Lipid Panel     Component Value Date/Time   CHOL 168 09/26/2012 0907   TRIG 118.0 09/26/2012 0907   HDL 31.40* 09/26/2012 0907   CHOLHDL 5 09/26/2012 0907   VLDL 23.6 09/26/2012 0907   LDLCALC 113* 09/26/2012 0907      Wt Readings from Last 3 Encounters:  10/13/15 153 lb (69.4 kg)  07/22/15 153 lb (69.4 kg)  07/14/15 149 lb (67.586 kg)     Other studies Reviewed: Additional studies/ records that were reviewed today include: . Review of the above records demonstrates:    Assessment and Plan:   1. CAD: Stable. PCI of OM August 2014. Continue medical therapy including ASA, Plavix, Norvasc.  No beta blocker with bradycardia. LV function normal by echo December 2016.   2. HTN: BP controlled. No changes.   3. HLD: He is intolerant of statins. We discussed starting a  PCSK9- inh but he is not interested in trying this.   4. Sinus  bradycardia: Stable. Asymptomatic. Event monitor December 2016 after syncopal event with sinus  brady, PACs, no pauses or high grade AV block.   Current medicines are reviewed at length with the patient today.  The patient does not have concerns regarding medicines.  The following changes have been made:  no change  Labs/ tests ordered today include:   No orders of the defined types were placed in this encounter.    Disposition:   FU with me in 12 months  Signed, Verne Carrow, MD 10/13/2015 3:24 PM    [facility] [address] [place], [place], [place]  [address] Phone: [phone]; Fax: [phone]

## 2015-11-12 ENCOUNTER — Encounter: Payer: Self-pay | Admitting: Family Medicine

## 2015-11-12 ENCOUNTER — Ambulatory Visit (INDEPENDENT_AMBULATORY_CARE_PROVIDER_SITE_OTHER): Payer: Medicare Other | Admitting: Family Medicine

## 2015-11-12 VITALS — BP 142/69 | HR 62 | Temp 98.1°F | Ht 67.0 in | Wt 150.1 lb

## 2015-11-12 DIAGNOSIS — Z23 Encounter for immunization: Secondary | ICD-10-CM

## 2015-11-12 DIAGNOSIS — R21 Rash and other nonspecific skin eruption: Secondary | ICD-10-CM | POA: Insufficient documentation

## 2015-11-12 DIAGNOSIS — I2510 Atherosclerotic heart disease of native coronary artery without angina pectoris: Secondary | ICD-10-CM | POA: Diagnosis not present

## 2015-11-12 LAB — POCT SKIN KOH: SKIN KOH, POC: NEGATIVE

## 2015-11-12 MED ORDER — TRIAMCINOLONE ACETONIDE 0.1 % EX OINT: 1.0000 "application " | TOPICAL_OINTMENT | Freq: Two times a day (BID) | CUTANEOUS | Status: DC

## 2015-11-12 MED ORDER — ZOSTER VACCINE LIVE 19400 UNT/0.65ML ~~LOC~~ SOLR: 0.6500 mL | Freq: Once | SUBCUTANEOUS | Status: DC

## 2015-11-12 NOTE — Progress Notes (Signed)
   Subjective:    Patient ID: Steve Gordon, male    DOB: 07/27/1936, 80 y.o.   MRN: [id number]  HPI First visit with me for a 80 yo male who has received the majority of his care through cardiology.  His acute problem is a sore on his right ankle present for at least two months.  He does not recall any trauma or contact with irritating agent.  (e.g. poison ivy.)  He has put a variety of generally innocuous things on the area including vasoline, some sort of powder for athletes foot and listerine.  Not improving.  Intensely pruritic  Behind on immunizations.  No recent tetanus.  No zostavax or pneumonia vaccine to his recollection.  CAD seems stable (no cp or DOE)  And is managed by cards.    Review of Systems     Objective:   Physical ExamLungs clear Cardiac RRR without m or g Ankles no edema.  Silver dollar sized red scaley lesion well-cicumscribed on right ankle over lateral malleolus.  No purulence.  Redness does not extend beyond lesion.   Good pulses and cap refill        Assessment & Plan:

## 2015-11-12 NOTE — Patient Instructions (Signed)
You will for sure get a tetanus shot today. My nurse will check to see if you need the second pneumonia vaccine. I have also [facility] in [facility]  I would wait a week or two before picking up.  Please let us know if/when you get that vaccine so that I can update your records. You have either eczema or a contact dermatitis.  I will send in a prescription for the steroid cream Please don't put anything else on it.  Also, do your best to quit scratching. See me back about the rash if it is not showing signs of improvement after two weeks, or immediately if it starts to look infected.

## 2015-11-12 NOTE — Assessment & Plan Note (Signed)
With neg KOH, this is likely in the eczema or contact dermatitis family of skin disorders.  Will treat with moderate/high dose triamcinolone oint.

## 2016-05-17 ENCOUNTER — Telehealth: Payer: Self-pay | Admitting: Cardiovascular Disease

## 2016-05-17 NOTE — Telephone Encounter (Signed)
Patient complaining of weakness and off & on chest pain. Patient stated his chest pain was worse this morning, but he did not take any nitroglycerin at that time. Patient stated, after calling the office he decided to take a nitroglycerin at 1:30 when he had some chest pain. Patient stated his chest pain went away and he developed a headache. Patient's BP 144/80 and HR 78. Informed patient that I think he needs to be seen today to be evaluated or go to ED. Patient stated if it gets worse or comes back he will go to ED. Patient stated he thinks he will be fine, until his appointment on Thursday. Encouraged patient to go to the ED if his chest pain returns. Patient verbalized understanding.

## 2016-05-17 NOTE — Telephone Encounter (Signed)
New Message  Pt c/o of Chest Pain: STAT if CP now or developed within 24 hours  1. Are you having CP right now? No  2. Are you experiencing any other symptoms (ex. SOB, nausea, vomiting, sweating)? No  3. How long have you been experiencing CP? This morning  4. Is your CP continuous or coming and going? Coming and going  5. Have you taken Nitroglycerin? No  BP: 170/85 ?

## 2016-05-18 ENCOUNTER — Encounter: Payer: Self-pay | Admitting: Cardiovascular Disease

## 2016-05-18 ENCOUNTER — Telehealth: Payer: Self-pay | Admitting: Cardiovascular Disease

## 2016-05-18 NOTE — Telephone Encounter (Signed)
New message  Pt wife call requesting to speak with RN. Pt wife did not want to disclose any information about call. Please call back to discuss

## 2016-05-18 NOTE — Telephone Encounter (Signed)
I spoke to Burna MortimerWanda (wife [place]). She states she called and sch appt w/ Dr Clifton JamesMcAlhany for 05/20/16.  She states pt's symptoms stopped last night, but he has had stents placed almost every 2 years since CABG, it has been 3 years since last stent.  She states she is worried and wants pt to be checked. I advised her to keep appt for reassurance. She voiced understanding and agreed with plan.

## 2016-05-19 ENCOUNTER — Encounter: Payer: Self-pay | Admitting: Cardiovascular Disease

## 2016-05-20 ENCOUNTER — Encounter (INDEPENDENT_AMBULATORY_CARE_PROVIDER_SITE_OTHER): Payer: Self-pay

## 2016-05-20 ENCOUNTER — Encounter: Payer: Self-pay | Admitting: Cardiovascular Disease

## 2016-05-20 ENCOUNTER — Ambulatory Visit (INDEPENDENT_AMBULATORY_CARE_PROVIDER_SITE_OTHER): Payer: PPO | Admitting: Cardiovascular Disease

## 2016-05-20 VITALS — BP 138/70 | HR 58 | Ht 67.0 in | Wt 153.4 lb

## 2016-05-20 DIAGNOSIS — R001 Bradycardia, unspecified: Secondary | ICD-10-CM

## 2016-05-20 DIAGNOSIS — I2510 Atherosclerotic heart disease of native coronary artery without angina pectoris: Secondary | ICD-10-CM

## 2016-05-20 DIAGNOSIS — E7800 Pure hypercholesterolemia, unspecified: Secondary | ICD-10-CM | POA: Diagnosis not present

## 2016-05-20 DIAGNOSIS — I10 Essential (primary) hypertension: Secondary | ICD-10-CM | POA: Diagnosis not present

## 2016-05-20 NOTE — Progress Notes (Signed)
Chief Complaint  Patient presents with  . Chest Pain     History of Present Illness: 80 yo male with history of CAD s/p 4V CABG 2003 (LIMA to LAD, SVG to Diagonal, SVG to OM, SVG to PDA), HLD with statin intolerance who is here today for cardiac follow up. He has been followed in the past by Dr. Riley Kill. He had an anterior MI in 1998 at which time a bare metal stent was placed in the LAD. Cardiac cath 2003 with severe stenoses in the Diagonal, OM2 and PDA. He underwent 4V CABG in 2003. He then had PCI of the mid RCA in 2008 (4.0 x 16 mm Liberte bare metal stent) and PCI of the OM1 with DES in 2011 (3.5 x 20 mm Promus Premier). I saw him in August 2014 and he had chest pain c/w unstable angina. Repeat cath 03/26/13: prox to mid LAD 60-70% (unchanged), Diagonal occluded, OM1 85-90% dist to stent, mid RCA 40%, mid RCA stent patent with 30% ISR, SVG to Diagonal patent, SVG to OM known to be occluded, SVG to PDA known to be occluded, LIMA to LAD known to be atretic, EF 55%. FFR of LAD: 0.90 (not flow limiting). 3.0 x32 mm Promus premier DES placed in the OM1 overlapping old stent. Post PCI course uneventful. 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 Echo with normal LV function and mild MR. Event monitor December 2016 with sinus brady, PACs, no pauses or high grade AV block.   He is here today for follow up. He has been doing well. He had one episode of chest pain while working on a clock last week. The pain lasted for several minutes. It returned 20 minutes later. He took a NTG and pain resolved. No associated SOB. He has been very active with no similar chest pains. No recurrence over last 7 days.   Primary Care Physician: No PCP Per Patient  Past Medical History:  Diagnosis Date  . CAD (coronary artery disease)    a. Ant MI 1998;  b. 2003: s/p CABG x 4 (VG->Diag, VG->OM, VG->PDA, LIMA->LAD);  c. PCI 2008: RCA - 4.0x16 Liberte BMS;  d. 07/2010 PCI: OM1- 3.5x20  Promus DES;  e. 03/2013 Cath/PCI: LM nl, LAD 60-70p/m (FFR 0.9->Med Rx), Diag 100, LCX patent prox stent, OM1 95-90(3.0x32 Promus Premier DES), RCA dom, 75m, 30 ISR, VG->Diag ok, VG->OM KTBO, VG->PDA KTBO, LIMA->LAD known atresia, EF 55%  . Dyslipidemia   . Hypertension   . Statin intolerance     Past Surgical History:  Procedure Laterality Date  . ANKLE FRACTURE SURGERY Left ~ 1999   "broke in 1993; casted; PCP said not broken; had MI; walked for rehab; wore ankle out; put in plates and screws" (03/26/2013)  . bronchogenic cyst resection  08/06/1997  . CHOLECYSTECTOMY    . CORONARY ANGIOPLASTY WITH STENT PLACEMENT  1998-03/26/2013   "2 at one time in 1998; this one  today is the 3rd one I've had since [facility] in 2003" (03/26/2013)  . CORONARY ANGIOPLASTY WITH STENT PLACEMENT  10/05/2006   4.55mmx 16mm Liberte non DES  . CORONARY ARTERY BYPASS GRAFT  2003   x4 LIMA to LAD SVG digonal, OM, PDA  . INGUINAL HERNIA REPAIR Right    "3 times" (03/26/2013)  . LAPAROSCOPIC INCISIONAL / UMBILICAL / VENTRAL HERNIA REPAIR  2004  . LEFT HEART CATHETERIZATION WITH CORONARY/GRAFT ANGIOGRAM N/A 03/26/2013   Procedure: LEFT HEART CATHETERIZATION WITH CORONARY/GRAFT ANGIOGRAM;  Surgeon: Kathleene Hazel, MD;  Location[facility] C[facility];  Service: Cardiovascular;  Laterality: N/A;    Current Outpatient Prescriptions  Medication Sig Dispense Refill  . amLODipine (NORVASC) 10 MG tablet Take 1 tablet (10 mg total) by mouth daily. 90 tablet 3  . aspirin EC 81 MG tablet Take 81 mg by mouth at bedtime.    . clopidogrel (PLAVIX) 75 MG tablet Take 1 tablet (75 mg total) by mouth daily. 90 tablet 3  . nitroGLYCERIN (NITROSTAT) 0.4 MG SL tablet Place 1 tablet (0.4 mg total) under the tongue every 5 (five) minutes as needed for chest pain. 75 tablet 3   No current facility-administered medications for this visit.     No Known Allergies  Social History   Social History  . Marital status: Married    Spouse name: N/A    . Number of children: N/A  . Years of education: N/A   Occupational History  . Retired, but works on Forensic scientist    Social History Main Topics  . Smoking status: Never Smoker  . Smokeless tobacco: Never Used  . Alcohol use Yes     Comment: 03/26/2013 "couple shots some weeks; none for 2 months; just take a drink q now and then"  . Drug use: No  . Sexual activity: Yes   Other Topics Concern  . 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 [phone]   Lives in [place] with wife. 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    Family History  Problem Relation Age of Onset  . Hypertension Other   . Heart disease Other   . Kidney cancer Other     Review of Systems:  As stated in the HPI and otherwise negative.   BP 138/70   Pulse (!) 58   Ht 5\' 7"  (1.702 m)   Wt 153 lb 6.4 oz (69.6 kg)   BMI 24.03 kg/m   Physical Examination: General: Well developed, well nourished, NAD  HEENT: OP clear, mucus membranes moist  SKIN: warm, dry. No rashes. Neuro: No focal deficits  Musculoskeletal: Muscle strength 5/5 all ext  Psychiatric: Mood and affect normal  Neck: No JVD, no carotid bruits, no thyromegaly, no lymphadenopathy.  Lungs:Clear bilaterally, no wheezes, rhonci, crackles Cardiovascular: Regular rate and rhythm. No murmurs, gallops or rubs. Abdomen:Soft. Bowel sounds present. Non-tender.  Extremities: No lower extremity edema. Pulses are 2 + in the bilateral DP/PT.  Echo December 2016: Left ventricle: The cavity size was normal. Wall thickness was normal. Systolic function was normal. The estimated ejection fraction was in the range of 55% to 60%. Wall motion was normal; there were no  regional wall motion abnormalities. Features are consistent with a pseudonormal left ventricular filling pattern, with concomitant abnormal relaxation and increased filling pressure (grade 2 diastolic dysfunction). - Aortic valve: Trileaflet; mildly thickened, mildly calcified leaflets. - Mitral valve: There was mild regurgitation. - Left atrium: The atrium was mildly dilated. - Right atrium: The atrium was mildly dilated.  Cardiac cath 03/26/13: PCI Note: He was given a bolus of Angiomax and a drip was started. I engaged the the left main with a XB 3.0 guiding catheter.  When the ACT was over 200, I passed a BMW wire down into the OM branch. A 2.0 x 12 mm balloon was used to pre-dilate the stenosis. I then carefully positioned and deployed a 3.0 x 32 mm Promus Premier DES in the OM branch overlapping the old stent. I then post-dilated the stent with a 3.25 x 20 mm [place] balloon x 2. The stenosis was taken from 85% down to 0%.  I then turned my attention to the LAD. The proximal stented segment had evidence of at least moderate restenosis. This likely represented a 60-70% stenosis but was only minimally changed from last cath in 2011. An FFR of this vessel was 0.90 after infusion of IV adenosine for 120 seconds. This suggested that the lesion is not flow limiting.  There were no immediate complications. Plavix 300 mg po x 1 during case. An Angioseal femoral artery closure device was placed in the right femoral artery. The patient was taken to the recovery area in stable condition.  Hemodynamic Findings:  Central aortic pressure: 118/59  Left ventricular pressure: 121/8/12  Angiographic Findings:  Left main: No obstructive disease.  Left Anterior Descending Artery: Moderate to large caliber vessel that courses to the apex. The proximal to mid vessel stented segment with 60-70% restenosis, grossly unchanged from last cath in 2011. The distal vessel has diffuse mild stenosis. The diagonal branch  is occluded but fills from the patent vein graft.  Circumflex Artery: Large caliber vessel with patent stent in proximal vessel extending into OM1. The first OM branch has a hazy, 85-90% stenosis distal to the stent. The AV groove Circumflex is small in caliber and has mild plaque disease.  Right Coronary Artery: Large dominant vessel with 40% mid stenosis, patent mid stent with 30% restenosis, mild distal disease.  Graft Anatomy:  SVG to Diagonal is patent  SVG to OM known to be occluded and not injected  SVG to PDA known to be occluded and not injected  LIMA to mid LAD is known to be atretic and not injected  Left Ventricular Angiogram: LVEF=55%. 1+ MR  Impression:  1. Triple vessel CAD s/p CABG with 1/4 patent grafts, patent stents LAD and RCA.  2. Unstable angina (class III) secondary to severe stenosis OM1  3. Successful PTCA/DES x 1 OM1  4. Moderate disease stented segment of proximal LAD, FFR of 0.90 suggesting this lesion is not flow limiting.  5. Preserved LV systolic function  EKG:  EKG is ordered today. The ekg ordered today demonstrates sinus brady, rate 58 bpm  Recent Labs: 07/12/2015: TSH 0.929 07/13/2015: BUN 16; Creatinine, Ser 1.21; Hemoglobin 11.9; Platelets 169; Potassium 3.9; Sodium 140   Lipid Panel Lipid Panel     Component Value Date/Time   CHOL 168 09/26/2012 0907   TRIG 118.0 09/26/2012 0907   HDL 31.40 (L) 09/26/2012 0907   CHOLHDL 5 09/26/2012 0907   VLDL 23.6 09/26/2012 0907   LDLCALC 113 (H) 09/26/2012 0907      Wt Readings from Last 3 Encounters:  05/20/16 153 lb 6.4 oz (69.6 kg)  11/12/15 150 lb 1.6 oz (68.1 kg)  10/13/15 153 lb (69.4 kg)     Other studies Reviewed: Additional studies/ records that were reviewed today include: . Review of the above records demonstrates:    Assessment and Plan:   1. CAD: He has stable angina. PCI of OM August 2014. Continue medical therapy including ASA, Plavix, Norvasc.  No beta blocker with bradycardia.  LV function normal by echo  December 2016. He will call with change in symptoms. If he has more chest pain, would likely proceed with cardiac cath given the moderate LAD stenosis.   2. HTN: BP controlled. No changes.   3. HLD: He is intolerant of statins. We discussed starting a  PCSK9- inh but he is not interested in trying this.   4. Sinus bradycardia: Stable. Asymptomatic. Event monitor December 2016 after syncopal event with sinus brady, PACs, no pauses or high grade AV block.   Current medicines are reviewed at length with the patient today.  The patient does not have concerns regarding medicines.  The following changes have been made:  no change  Labs/ tests ordered today include:   No orders of the defined types were placed in this encounter.   Disposition:   FU with me in 12 months  Signed, Verne Carrow, MD 05/20/2016 12:40 PM    [facility] [address] [place], [place], [place]  [address] Phone: [phone]; Fax: [phone]

## 2016-05-20 NOTE — Patient Instructions (Signed)
Medication Instructions:  Your physician recommends that you continue on your current medications as directed. Please refer to the Current Medication list given to you today.   Labwork: none  Testing/Procedures: none  Follow-Up: Your physician wants you to follow-up in: 6 months. You will receive a reminder letter in the mail two months in advance. If you don't receive a letter, please call our office to schedule the follow-up appointment.   Call us with the name of your mail order pharmacy so we can send your refills in.     If you need a refill on your cardiac medications before your next appointment, please call your pharmacy.

## 2016-07-12 ENCOUNTER — Telehealth: Payer: Self-pay | Admitting: Cardiovascular Disease

## 2016-07-12 MED ORDER — CLOPIDOGREL BISULFATE 75 MG PO TABS: 75.0000 mg | ORAL_TABLET | Freq: Every day | ORAL | 3 refills | Status: DC

## 2016-07-12 MED ORDER — NITROGLYCERIN 0.4 MG SL SUBL: 0.4000 mg | SUBLINGUAL_TABLET | SUBLINGUAL | 3 refills | Status: DC | PRN

## 2016-07-12 MED ORDER — AMLODIPINE BESYLATE 10 MG PO TABS: 10.0000 mg | ORAL_TABLET | Freq: Every day | ORAL | 3 refills | Status: DC

## 2016-07-12 NOTE — Telephone Encounter (Signed)
I spoke with pt's wife. She would like prescription for amlodipine, plavix and [facility]. Will send in.

## 2016-07-12 NOTE — Telephone Encounter (Signed)
New Message:     Please call,concerning all of medication.

## 2016-12-03 ENCOUNTER — Encounter: Payer: Self-pay | Admitting: Cardiovascular Disease

## 2016-12-03 ENCOUNTER — Ambulatory Visit (INDEPENDENT_AMBULATORY_CARE_PROVIDER_SITE_OTHER): Payer: PPO | Admitting: Cardiovascular Disease

## 2016-12-03 ENCOUNTER — Encounter (INDEPENDENT_AMBULATORY_CARE_PROVIDER_SITE_OTHER): Payer: Self-pay

## 2016-12-03 VITALS — BP 128/64 | HR 63 | Ht 69.0 in | Wt 159.0 lb

## 2016-12-03 DIAGNOSIS — E7800 Pure hypercholesterolemia, unspecified: Secondary | ICD-10-CM

## 2016-12-03 DIAGNOSIS — I10 Essential (primary) hypertension: Secondary | ICD-10-CM | POA: Diagnosis not present

## 2016-12-03 DIAGNOSIS — I25118 Atherosclerotic heart disease of native coronary artery with other forms of angina pectoris: Secondary | ICD-10-CM

## 2016-12-03 NOTE — Patient Instructions (Signed)
Medication Instructions:  Your physician recommends that you continue on your current medications as directed. Please refer to the Current Medication list given to you today.   Labwork: Your physician recommends that you return for lab work on Dec 10, 2016.  This will be fasting. --CMET and lipid profile.  The lab opens at 7:30 AM   Testing/Procedures: none  Follow-Up: Your physician recommends that you schedule a follow-up appointment in: 12 months. Please call our office in about 9 months to schedule this appointment.     Any Other Special Instructions Will Be Listed Below (If Applicable).     If you need a refill on your cardiac medications before your next appointment, please call your pharmacy.

## 2016-12-03 NOTE — Progress Notes (Signed)
Chief Complaint  Patient presents with  . Follow-up     History of Present Illness: 81 yo male with history of CAD s/p 4V CABG 2003 (LIMA to LAD, SVG to Diagonal, SVG to OM, SVG to PDA), HLD with statin intolerance who is here today for cardiac follow up. He had been followed in the past by Dr. Lia Foyer. He had an anterior MI in 1998 at which time a bare metal stent was placed in the LAD. Cardiac cath 2003 with severe stenoses in the Diagonal, OM2 and PDA. He underwent 4V CABG in 2003. He then had PCI of the mid RCA in 2008 (4.0 x 16 mm Liberte bare metal stent) and PCI of the OM1 with DES in 2011 (3.5 x 20 mm Promus Premier). I saw him in August 2014 and he had chest pain c/w unstable angina. Repeat cath 03/26/13: prox to mid LAD 60-70% (unchanged), Diagonal occluded, OM1 85-90% dist to stent, mid RCA 40%, mid RCA stent patent with 30% ISR, SVG to Diagonal patent, SVG to OM known to be occluded, SVG to PDA known to be occluded, LIMA to LAD known to be atretic, EF 55%. FFR of LAD: 0.90 (not flow limiting). 3.0 x32 mm Promus premier DES placed in the [place] overlapping old stent. 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 Echo with normal LV function and mild MR. Event monitor December 2016 with sinus brady, PACs, no pauses or high grade AV block.   He is here today for follow up. The patient denies any chest pain, dyspnea, palpitations, lower extremity edema, orthopnea, PND, dizziness, near syncope or syncope.     Primary Care Physician: Hensel,William  Past Medical History:  Diagnosis Date  . CAD (coronary artery disease)    a. Ant MI 1998;  b. 2003: s/p CABG x 4 (VG->Diag, VG->OM, VG->PDA, LIMA->LAD);  c. PCI 2008: RCA - 4.0x16 Liberte BMS;  d. 07/2010 PCI: OM1- 3.5x20 Promus DES;  e. 03/2013 Cath/PCI: LM nl, LAD 60-70p/m (FFR 0.9->Med Rx), Diag 100, LCX patent prox stent, OM1 95-90(3.0x32 Promus Premier DES), RCA dom, 27m 30 ISR, VG->Diag ok, VG->OM KTBO,  VG->PDA KTBO, LIMA->LAD known atresia, EF 55%  . Dyslipidemia   . Hypertension   . Statin intolerance     Past Surgical History:  Procedure Laterality Date  . ANKLE FRACTURE SURGERY Left ~ 1999   "broke in 1993; casted; PCP said not broken; had MI; walked for rehab; wore ankle out; put in plates and screws" (88/93/8101  . bronchogenic cyst resection  08/06/1997  . CHOLECYSTECTOMY    . CORONARY ANGIOPLASTY WITH STENT PLACEMENT  1998-03/26/2013   "2 at one time in 1998; this one  today is the 3rd one I've had since OHS in 2003" (03/26/2013)  . CORONARY ANGIOPLASTY WITH STENT PLACEMENT  10/05/2006   4.078m 1655miberte non DES  . CORONARY ARTERY BYPASS GRAFT  2003   x4 LIMA to LAD SVG digonal, OM, PDA  . INGUINAL HERNIA REPAIR Right    "3 times" (03/26/2013)  . LAPAROSCOPIC INCISIONAL / UMBILICAL / VENTRAL HERNIA REPAIR  2004  . LEFT HEART CATHETERIZATION WITH CORONARY/GRAFT ANGIOGRAM N/A 03/26/2013   Procedure: LEFT HEART CATHETERIZATION WITH CORBeatrix FettersSurgeon: ChrBurnell BlanksD;  [facility];  Service: Cardiovascular;  Laterality: N/A;    Current Outpatient Prescriptions  Medication Sig Dispense Refill  . amLODipine (NORVASC) 10 MG tablet Take 1 tablet (10 mg total) by mouth daily. 90[place]  tablet 3  . aspirin EC 81 MG tablet Take 81 mg by mouth at bedtime.    . clopidogrel (PLAVIX) 75 MG tablet Take 1 tablet (75 mg total) by mouth daily. 90 tablet 3  . nitroGLYCERIN (NITROSTAT) 0.4 MG SL tablet Place 1 tablet (0.4 mg total) under the tongue every 5 (five) minutes as needed for chest pain. 75 tablet 3   No current facility-administered medications for this visit.     No Known Allergies  Social History   Social History  . Marital status: Married    Spouse name: N/A  . Number of children: N/A  . Years of education: N/A   Occupational History  . Retired, but works on Chartered certified accountant    Social History Main Topics  . Smoking status: Never Smoker  .  Smokeless tobacco: Never Used  . Alcohol use Yes     Comment: 03/26/2013 "couple shots some weeks; none for 2 months; just take a drink q now and then"  . Drug use: No  . Sexual activity: Yes   Other Topics Concern  . Not on file   Social History Narrative   Level of education: 10th grade   Employment: retired, previously self employed and truck Lexicographer: self    Exercise: works on his cars    Housing situation: house    Relationships (safe): yes   Sport and exercise psychologist for message (voicemail): Wife Mariann Laster) ok to leave information with her. [phone]   Lives in [place] with wife. 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    Family History  Problem Relation Age of Onset  . Hypertension Other   . Heart disease Other   . Kidney cancer Other     Review of Systems:  As stated in the HPI and otherwise negative.   BP 128/64   Pulse 63   Ht 5' 9"  (1.753 m)   Wt 159 lb (72.1 kg)   SpO2 98%   BMI 23.48 kg/m   Physical Examination:  General: Well developed, well nourished, NAD  HEENT: OP clear, mucus membranes moist  SKIN: warm, dry. No rashes. Neuro: No focal deficits  Musculoskeletal: Muscle strength 5/5 all ext  Psychiatric: Mood and affect normal  Neck: No JVD, no carotid bruits, no thyromegaly, no lymphadenopathy.  Lungs:Clear bilaterally, no wheezes, rhonci, crackles Cardiovascular: Regular rate and rhythm. No murmurs, gallops or rubs. Abdomen:Soft. Bowel sounds present. Non-tender.  Extremities: No lower extremity edema. Pulses are 2 + in the bilateral DP/PT.  Echo December 2016: Left ventricle: The cavity size was normal. Wall thickness was normal. Systolic function was normal. The estimated ejection fraction was in the range of 55% to 60%. Wall motion was normal; there were no regional wall motion abnormalities. Features are consistent with a pseudonormal left ventricular filling pattern, with concomitant abnormal relaxation and increased  filling pressure (grade 2 diastolic dysfunction). - Aortic valve: Trileaflet; mildly thickened, mildly calcified leaflets. - Mitral valve: There was mild regurgitation. - Left atrium: The atrium was mildly dilated. - Right atrium: The atrium was mildly dilated.  Cardiac cath 03/26/13: PCI Note: He was given a bolus of Angiomax and a drip was started. I engaged the the left main with a XB 3.0 guiding catheter. When the ACT was over 200, I passed a BMW wire down into the OM branch. A 2.0 x 12 mm balloon was used to pre-dilate the stenosis. I then carefully positioned and deployed a 3.0 x 32 mm Promus Premier DES in  the OM branch overlapping the old stent. I then post-dilated the stent with a 3.25 x 20 mm [place] balloon x 2. The stenosis was taken from 85% down to 0%.  I then turned my attention to the LAD. The proximal stented segment had evidence of at least moderate restenosis. This likely represented a 60-70% stenosis but was only minimally changed from last cath in 2011. An FFR of this vessel was 0.90 after infusion of IV adenosine for 120 seconds. This suggested that the lesion is not flow limiting.  There were no immediate complications. Plavix 300 mg po x 1 during case. An Angioseal femoral artery closure device was placed in the right femoral artery. The patient was taken to the recovery area in stable condition.  Hemodynamic Findings:  Central aortic pressure: 118/59  Left ventricular pressure: 121/8/12  Angiographic Findings:  Left main: No obstructive disease.  Left Anterior Descending Artery: Moderate to large caliber vessel that courses to the apex. The proximal to mid vessel stented segment with 60-70% restenosis, grossly unchanged from last cath in 2011. The distal vessel has diffuse mild stenosis. The diagonal branch is occluded but fills from the patent vein graft.  Circumflex Artery: Large caliber vessel with patent stent in proximal vessel extending into OM1. The first OM branch has  a hazy, 85-90% stenosis distal to the stent. The AV groove Circumflex is small in caliber and has mild plaque disease.  Right Coronary Artery: Large dominant vessel with 40% mid stenosis, patent mid stent with 30% restenosis, mild distal disease.  Graft Anatomy:  SVG to Diagonal is patent  SVG to OM known to be occluded and not injected  SVG to PDA known to be occluded and not injected  LIMA to mid LAD is known to be atretic and not injected  Left Ventricular Angiogram: LVEF=55%. 1+ MR  Impression:  1. Triple vessel CAD s/p CABG with 1/4 patent grafts, patent stents LAD and RCA.  2. Unstable angina (class III) secondary to severe stenosis OM1  3. Successful PTCA/DES x 1 OM1  4. Moderate disease stented segment of proximal LAD, FFR of 0.90 suggesting this lesion is not flow limiting.  5. Preserved LV systolic function  EKG:  EKG is not ordered today. The ekg ordered today demonstrates   Recent Labs: No results found for requested labs within last 8760 hours.   Lipid Panel Lipid Panel      Wt Readings from Last 3 Encounters:  12/03/16 159 lb (72.1 kg)  05/20/16 153 lb 6.4 oz (69.6 kg)  11/12/15 150 lb 1.6 oz (68.1 kg)     Other studies Reviewed: Additional studies/ records that were reviewed today include: . Review of the above records demonstrates:    Assessment and Plan:   1. CAD with stable angina: He has chronic stable angina. No chest pain lately. Last PCI was in 2014. Will continue ASA, Plavix, Norvasc. He does not tolerate beta blockers due to bradycardia. LV function normal by echo December 2016.  Check CMET.   2. HTN: BP is controlled. No changes.    3. HLD: Intolerant of statins. He is not interested in trying a PCSK9 inhibitor.  Repeat lipids now.   4. Sinus bradycardia: No symptoms.   Current medicines are reviewed at length with the patient today.  The patient does not have concerns regarding medicines.  The following changes have been made:  no  change  Labs/ tests ordered today include:   Orders Placed This Encounter  Procedures  .  Comp Met (CMET)  . Lipid Profile    Disposition:   FU with me in 12 months  Signed, Lauree Chandler, MD 12/03/2016 4:10 PM    [facility] [place], [place], [place]  [address] Phone: [phone]; Fax: [phone]

## 2016-12-10 ENCOUNTER — Other Ambulatory Visit: Payer: PPO | Admitting: *Deleted

## 2016-12-10 DIAGNOSIS — E7800 Pure hypercholesterolemia, unspecified: Secondary | ICD-10-CM | POA: Diagnosis not present

## 2016-12-10 LAB — COMPREHENSIVE METABOLIC PANEL
A/G RATIO: 1.7 (ref 1.2–2.2)
ALBUMIN: 4 g/dL (ref 3.5–4.8)
ALT: 12 IU/L (ref 0–44)
AST: 15 IU/L (ref 0–40)
Alkaline Phosphatase: 94 IU/L (ref 39–117)
BILIRUBIN TOTAL: 0.6 mg/dL (ref 0.0–1.2)
BUN / CREAT RATIO: 18 (ref 10–24)
BUN: 24 mg/dL (ref 8–27)
CHLORIDE: 102 mmol/L (ref 96–106)
CO2: 22 mmol/L (ref 18–29)
Calcium: 9 mg/dL (ref 8.6–10.2)
Creatinine, Ser: 1.31 mg/dL — ABNORMAL HIGH (ref 0.76–1.27)
GFR calc non Af Amer: 51 mL/min/{1.73_m2} — ABNORMAL LOW (ref >59)
GFR, EST AFRICAN AMERICAN: 59 mL/min/{1.73_m2} — AB (ref >59)
Globulin, Total: 2.4 g/dL (ref 1.5–4.5)
Glucose: 92 mg/dL (ref 65–99)
POTASSIUM: 4.5 mmol/L (ref 3.5–5.2)
SODIUM: 141 mmol/L (ref 134–144)
TOTAL PROTEIN: 6.4 g/dL (ref 6.0–8.5)

## 2016-12-10 LAB — LIPID PANEL
Chol/HDL Ratio: 4.3 ratio (ref 0.0–5.0)
Cholesterol, Total: 173 mg/dL (ref 100–199)
HDL: 40 mg/dL (ref >39)
LDL Calculated: 122 mg/dL — ABNORMAL HIGH (ref 0–99)
Triglycerides: 55 mg/dL (ref 0–149)
VLDL Cholesterol Cal: 11 mg/dL (ref 5–40)

## 2017-08-10 ENCOUNTER — Telehealth: Payer: Self-pay | Admitting: Cardiovascular Disease

## 2017-08-10 MED ORDER — AMLODIPINE BESYLATE 10 MG PO TABS: 10.0000 mg | ORAL_TABLET | Freq: Every day | ORAL | 3 refills | Status: DC

## 2017-08-10 MED ORDER — CLOPIDOGREL BISULFATE 75 MG PO TABS: 75.0000 mg | ORAL_TABLET | Freq: Every day | ORAL | 3 refills | Status: DC

## 2017-08-10 NOTE — Telephone Encounter (Signed)
°*  STAT* If patient is at the pharmacy, call can be transferred to refill team.   1. Which medications need to be refilled? (please list name of each medication and dose if known)Amlodipine Besylape  10 mg , Clopidogrel 75mg     2. Which pharmacy/location (including street and city if local pharmacy) is medication to be sent to? [facility]   3. Do they need a 30 day or 90 day supply? 90

## 2017-08-10 NOTE — Telephone Encounter (Signed)
Followup    The patient is calling back to inform [facility]

## 2017-08-10 NOTE — Telephone Encounter (Signed)
Follow up    *STAT* If patient is at the pharmacy, call can be transferred to refill team.   1. Which medications need to be refilled? (please list name of each medication and dose if known)Amlodipine Besylape  10 mg , Clopidogrel 75mg        2. Which pharmacy/location (including street and city if local pharmacy) is medication to be sent to? [facility]    3. Do they need a 30 day or 90 day supply? 90

## 2017-08-10 NOTE — Telephone Encounter (Signed)
Refill sent to the pharmacy electronically.  

## 2017-08-11 NOTE — Telephone Encounter (Signed)
Pt's medication has already been sent to pt's pharmacy as requested. Confirmation received.  

## 2017-08-19 DIAGNOSIS — H903 Sensorineural hearing loss, bilateral: Secondary | ICD-10-CM | POA: Diagnosis not present

## 2017-11-29 ENCOUNTER — Encounter: Payer: Self-pay | Admitting: Student

## 2017-11-29 ENCOUNTER — Ambulatory Visit (INDEPENDENT_AMBULATORY_CARE_PROVIDER_SITE_OTHER): Payer: PPO | Admitting: Student

## 2017-11-29 ENCOUNTER — Other Ambulatory Visit: Payer: Self-pay

## 2017-11-29 VITALS — BP 136/68 | HR 57 | Temp 98.1°F | Ht 69.0 in | Wt 160.4 lb

## 2017-11-29 DIAGNOSIS — L309 Dermatitis, unspecified: Secondary | ICD-10-CM

## 2017-11-29 DIAGNOSIS — R21 Rash and other nonspecific skin eruption: Secondary | ICD-10-CM | POA: Diagnosis not present

## 2017-11-29 DIAGNOSIS — J309 Allergic rhinitis, unspecified: Secondary | ICD-10-CM | POA: Insufficient documentation

## 2017-11-29 MED ORDER — TRIAMCINOLONE ACETONIDE 0.5 % EX OINT: 1.0000 "application " | TOPICAL_OINTMENT | Freq: Two times a day (BID) | CUTANEOUS | 2 refills | Status: AC

## 2017-11-29 MED ORDER — FLUTICASONE PROPIONATE 50 MCG/ACT NA SUSP: 2.0000 | Freq: Every day | NASAL | 6 refills | Status: DC

## 2017-11-29 MED ORDER — TRIAMCINOLONE ACETONIDE 0.5 % EX OINT: 1.0000 "application " | TOPICAL_OINTMENT | Freq: Two times a day (BID) | CUTANEOUS | 2 refills | Status: DC

## 2017-11-29 NOTE — Progress Notes (Signed)
  Subjective:    Steve Gordon is a 82 y.o. old male here for skin rash.  Patient is here with his wife.  HPI Skin rash: This is a chronic issue.  Rash flares up intermittently.  He described the rash as pruritic and occasionally burning.  Rash is located behind he is tried lateral malleolus.  Denies recent illness of fever.  He was treated with steroid cream with good response in the past.  Character skin test from the same lesion was negative in the past. Patient denies new medication, food, cosmetic assault.  Denies history of psoriasis. PMH/Problem List: has HYPERCHOLESTEROLEMIA  IIA; CAD, ARTERY BYPASS GRAFT; HYPERTENSION, BENIGN; Incidental lung nodule, > 3mm and < 8mm; Coronary artery disease; Sinus bradycardia; Syncope; Coronary artery disease involving native coronary artery of native heart without angina pectoris; Hyperlipidemia; and Rash and nonspecific skin eruption on their problem list.   has a past medical history of CAD (coronary artery disease), Dyslipidemia, Hypertension, and Statin intolerance.  FH:  Family History  Problem Relation Age of Onset  . Hypertension Other   . Heart disease Other   . Kidney cancer Other     SH Social History   Tobacco Use  . Smoking status: Never Smoker  . Smokeless tobacco: Never Used  Substance Use Topics  . Alcohol use: Yes    Comment: 03/26/2013 "couple shots some weeks; none for 2 months; just take a drink q now and then"  . Drug use: No    Review of Systems Review of systems negative except for pertinent positives and negatives in history of present illness above.     Objective:     Vitals:   11/29/17 1417  BP: 136/68  Pulse: (!) 57  Temp: 98.1 F (36.7 C)  TempSrc: Oral  SpO2: 99%  Weight: 160 lb 6.4 oz (72.8 kg)  Height: 5\' 9"  (1.753 m)   Body mass index is 23.69 kg/m.  Physical Exam  GEN: appears well & comfortable. No apparent distress. CVS: 2+ DP pulses bilaterally RESP: no IWOB MSK: no focal tenderness or  swelling SKIN: Excoriated erythematous skin lesion behind his right lateral malleolus. No apparent swelling or cellulitic skin lesion.  See picture for more.    NEURO: alert and oiented appropriately, no gross deficits   PSYCH: euthymic mood with congruent affect    Assessment and Plan:  1. Rash and nonspecific skin eruption: unclear etiology but history and exam suggestive for eczematous or psoriatic skin lesion.  Skin lesion responded to steroid cream in the past.  KOH from the same lesion negative in the past.  Will treat with triamcinolone 0.5% ointment 2 weeks.  Recommended trying emollients after 2 weeks.  Can consider a skin biopsy for definitive diagnosis but doubt the utility of this at this time.  2. Allergic rhinitis, unspecified seasonality, unspecified trigger: patient brought up seasonal nasal and eye irritation toward the end of the encounter.  Recommended trying Flonase nasal spray and saline nose spray.  Discussed about proper way of using both medications. - fluticasone (FLONASE) 50 MCG/ACT nasal spray; Place 2 sprays into both nostrils daily.  Dispense: 16 g; Refill: 6   Return if symptoms worsen or fail to improve.  Almon Herculesaye T Gonfa, MD 11/29/17 Pager: [phone]

## 2017-11-29 NOTE — Patient Instructions (Addendum)
It was great seeing you today! We have addressed the following issues today  Skin rash: This could be eczema or psoriasis.  Regardless, the treatment is the same.  We gave you a prescription for steroid ointment.  You can also apply regular Vaseline to keep the skin moist to prevent the flareup.  Allergic rhinitis: Gave you a prescription for Flonase nasal spray.  Use Flonase nasal spray daily as we discussed in the office.  You can also try saline nose spray 4-5 times a day for nasal congestion.   If we did any lab work today, and the results require attention, either me or my nurse will get in touch with you. If everything is normal, you will get a letter in mail and a message via . If you don't hear from us in two weeks, please give us a call. Otherwise, we look forward to seeing you again at your next visit. If you have any questions or concerns before then, please call the clinic at [phone].  Please bring all your medications to every doctors visit  Sign up for My Chart to have easy access to your labs results, and communication with your Primary care physician.    Please check-out at the front desk before leaving the clinic.    Take Care,   Dr. Alanda SlimGonfa

## 2017-11-30 ENCOUNTER — Telehealth: Payer: Self-pay

## 2017-11-30 NOTE — Telephone Encounter (Signed)
We don't recommend using steroid ointment continuously for more than two weeks. I have discussed this with patient yesterday. The 30 gm tube can last him long. May someone call his pharmacy and explain this. Thank you, Alwyn Renaye

## 2017-11-30 NOTE — Telephone Encounter (Signed)
Pharmacy calling to request new rx for patients kenalog cream- would like to dispense 90 day supply, cheaper for pt. If ok please send new rx to [facility] Shawna OrleansMeredith B Thomsen, RN

## 2017-12-01 NOTE — Telephone Encounter (Signed)
Pharmacy informed. Kein Carlberg Dawn, CMA  

## 2018-04-03 ENCOUNTER — Encounter

## 2018-04-03 ENCOUNTER — Encounter (INDEPENDENT_AMBULATORY_CARE_PROVIDER_SITE_OTHER): Payer: Self-pay

## 2018-04-03 ENCOUNTER — Encounter: Payer: Self-pay | Admitting: Cardiovascular Disease

## 2018-04-03 ENCOUNTER — Ambulatory Visit: Payer: PPO | Admitting: Cardiovascular Disease

## 2018-04-03 VITALS — BP 128/80 | HR 57 | Ht 69.0 in | Wt 156.0 lb

## 2018-04-03 DIAGNOSIS — I10 Essential (primary) hypertension: Secondary | ICD-10-CM | POA: Diagnosis not present

## 2018-04-03 DIAGNOSIS — I25118 Atherosclerotic heart disease of native coronary artery with other forms of angina pectoris: Secondary | ICD-10-CM

## 2018-04-03 DIAGNOSIS — R001 Bradycardia, unspecified: Secondary | ICD-10-CM

## 2018-04-03 DIAGNOSIS — E7800 Pure hypercholesterolemia, unspecified: Secondary | ICD-10-CM | POA: Diagnosis not present

## 2018-04-03 NOTE — Progress Notes (Signed)
Chief Complaint  Patient presents with  . Follow-up    CAD     History of Present Illness: 82 yo male with history of CAD s/p 4V CABG 2003 (LIMA to LAD, SVG to Diagonal, SVG to OM, SVG to PDA), HLD with statin intolerance who is here today for cardiac follow up. He had been followed in the past by Dr. Riley Kill. He had an anterior MI in 1998 at which time a bare metal stent was placed in the LAD. Cardiac cath 2003 with severe stenoses in the Diagonal, OM2 and PDA. He underwent 4V CABG in 2003. He then had PCI of the mid RCA in 2008 (4.0 x 16 mm Liberte bare metal stent) and PCI of the OM1 with DES in 2011 (3.5 x 20 mm Promus Premier). I saw him in August 2014 and he had chest pain c/w unstable angina. Repeat cath 03/26/13: prox to mid LAD 60-70% (unchanged), Diagonal occluded, OM1 85-90% dist to stent, mid RCA 40%, mid RCA stent patent with 30% ISR, SVG to Diagonal patent, SVG to OM known to be occluded, SVG to PDA known to be occluded, LIMA to LAD known to be atretic, EF 55%. FFR of LAD: 0.90 (not flow limiting). 3.0 x32 mm Promus premier DES placed in the OM1 overlapping old stent. 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 Echo with normal LV function and mild MR. Event monitor December 2016 with sinus brady, PACs, no pauses or high grade AV block.   He is here today for follow up. The patient denies any dyspnea, palpitations, lower extremity edema, orthopnea, PND, dizziness, near syncope or syncope. Rare chest pains that last for 45 seconds and resolves on its own. No associated dyspnea or diaphoresis.   Primary Care Physician: Moses Manners, MD   Past Medical History:  Diagnosis Date  . CAD (coronary artery disease)    a. Ant MI 1998;  b. 2003: s/p CABG x 4 (VG->Diag, VG->OM, VG->PDA, LIMA->LAD);  c. PCI 2008: RCA - 4.0x16 Liberte BMS;  d. 07/2010 PCI: OM1- 3.5x20 Promus DES;  e. 03/2013 Cath/PCI: LM nl, LAD 60-70p/m (FFR 0.9->Med Rx), Diag 100,  LCX patent prox stent, OM1 95-90(3.0x32 Promus Premier DES), RCA dom, 55m, 30 ISR, VG->Diag ok, VG->OM KTBO, VG->PDA KTBO, LIMA->LAD known atresia, EF 55%  . Dyslipidemia   . Hypertension   . Statin intolerance     Past Surgical History:  Procedure Laterality Date  . ANKLE FRACTURE SURGERY Left ~ 1999   "broke in 1993; casted; PCP said not broken; had MI; walked for rehab; wore ankle out; put in plates and screws" (03/26/2013)  . bronchogenic cyst resection  08/06/1997  . CHOLECYSTECTOMY    . CORONARY ANGIOPLASTY WITH STENT PLACEMENT  1998-03/26/2013   "2 at one time in 1998; this one  today is the 3rd one I've had since [facility] in 2003" (03/26/2013)  . CORONARY ANGIOPLASTY WITH STENT PLACEMENT  10/05/2006   4.43mmx 16mm Liberte non DES  . CORONARY ARTERY BYPASS GRAFT  2003   x4 LIMA to LAD SVG digonal, OM, PDA  . INGUINAL HERNIA REPAIR Right    "3 times" (03/26/2013)  . LAPAROSCOPIC INCISIONAL / UMBILICAL / VENTRAL HERNIA REPAIR  2004  . LEFT HEART CATHETERIZATION WITH CORONARY/GRAFT ANGIOGRAM N/A 03/26/2013   Procedure: LEFT HEART CATHETERIZATION WITH Isabel Caprice;  Surgeon: Kathleene Hazel, MD;  Location: [facility] CATH LAB;  Service: Cardiovascular;  Laterality: N/A;    Current Outpatient Medications  Medication Sig Dispense Refill  . amLODipine (NORVASC) 10 MG tablet Take 1 tablet (10 mg total) by mouth daily. 90 tablet 3  . aspirin EC 81 MG tablet Take 81 mg by mouth at bedtime.    . clopidogrel (PLAVIX) 75 MG tablet Take 1 tablet (75 mg total) by mouth daily. 90 tablet 3  . fluticasone (FLONASE) 50 MCG/ACT nasal spray Place 2 sprays into both nostrils daily. 16 g 6  . nitroGLYCERIN (NITROSTAT) 0.4 MG SL tablet Place 1 tablet (0.4 mg total) under the tongue every 5 (five) minutes as needed for chest pain. 75 tablet 3   No current facility-administered medications for this visit.     No Known Allergies  Social History   Socioeconomic History  . Marital status: Married      Spouse name: Not on file  . Number of children: Not on file  . Years of education: Not on file  . Highest education level: Not on file  Occupational History  . Occupation: Retired, but works on cars part-time  Social Needs  . Financial resource strain: Not on file  . Food insecurity:    Worry: Not on file    Inability: Not on file  . Transportation needs:    Medical: Not on file    Non-medical: Not on file  Tobacco Use  . Smoking status: Never Smoker  . Smokeless tobacco: Never Used  Substance and Sexual Activity  . Alcohol use: Yes    Comment: 03/26/2013 "couple shots some weeks; none for 2 months; just take a drink q now and then"  . Drug use: No  . Sexual activity: Yes  Lifestyle  . Physical activity:    Days per week: Not on file    Minutes per session: Not on file  . Stress: Not on file  Relationships  . Social connections:    Talks on phone: Not on file    Gets together: Not on file    Attends religious service: Not on file    Active member of club or organization: Not on file    Attends meetings of clubs or organizations: Not on file    Relationship status: Not on file  . Intimate partner violence:    Fear of current or ex partner: Not on file    Emotionally abused: Not on file    Physically abused: Not on file    Forced sexual activity: Not on file  Other Topics Concern  . Not on file  Social History Narrative   Level of education: 10th grade   Employment: retired, previously self employed and truck Physiological scientistdriver    Transportation: self    Exercise: works on his cars    Housing situation: house    Relationships (safe): yes   SolicitorContact for message (voicemail): Wife [name]([name]) ok to leave information with her. [phone]   [place] in [place]. 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    Family History  Problem Relation Age of Onset  . Hypertension Other   . Heart disease Other   . Kidney cancer Other     Review of Systems:  As stated in the HPI and  otherwise negative.   BP 128/80   Pulse (!) 57   Ht 5\' 9"  (1.753 m)   Wt 156 lb (70.8 kg)   BMI 23.04 kg/m   Physical Examination:  General: Well developed, well nourished, NAD  HEENT: OP clear, mucus membranes moist  SKIN: warm, dry. No rashes. Neuro:  No focal deficits  Musculoskeletal: Muscle strength 5/5 all ext  Psychiatric: Mood and affect normal  Neck: No JVD, no carotid bruits, no thyromegaly, no lymphadenopathy.  Lungs:Clear bilaterally, no wheezes, rhonci, crackles Cardiovascular: Regular rate and rhythm. No murmurs, gallops or rubs. Abdomen:Soft. Bowel sounds present. Non-tender.  Extremities: No lower extremity edema. Pulses are 2 + in the bilateral DP/PT.  Echo December 2016: Left ventricle: The cavity size was normal. Wall thickness was normal. Systolic function was normal. The estimated ejection fraction was in the range of 55% to 60%. Wall motion was normal; there were no regional wall motion abnormalities. Features are consistent with a pseudonormal left ventricular filling pattern, with concomitant abnormal relaxation and increased filling pressure (grade 2 diastolic dysfunction). - Aortic valve: Trileaflet; mildly thickened, mildly calcified leaflets. - Mitral valve: There was mild regurgitation. - Left atrium: The atrium was mildly dilated. - Right atrium: The atrium was mildly dilated.  Cardiac cath 03/26/13: Hemodynamic Findings:  Central aortic pressure: 118/59  Left ventricular pressure: 121/8/12  Angiographic Findings:  Left main: No obstructive disease.  Left Anterior Descending Artery: Moderate to large caliber vessel that courses to the apex. The proximal to mid vessel stented segment with 60-70% restenosis, grossly unchanged from last cath in 2011. The distal vessel has diffuse mild stenosis. The diagonal branch is occluded but fills from the patent vein graft.  Circumflex Artery: Large caliber vessel with patent stent in proximal  vessel extending into OM1. The first OM branch has a hazy, 85-90% stenosis distal to the stent. The AV groove Circumflex is small in caliber and has mild plaque disease.  Right Coronary Artery: Large dominant vessel with 40% mid stenosis, patent mid stent with 30% restenosis, mild distal disease.  Graft Anatomy:  SVG to Diagonal is patent  SVG to OM known to be occluded and not injected  SVG to PDA known to be occluded and not injected  LIMA to mid LAD is known to be atretic and not injected  Left Ventricular Angiogram: LVEF=55%. 1+ MR  Impression:  1. Triple vessel CAD s/p CABG with 1/4 patent grafts, patent stents LAD and RCA.  2. Unstable angina (class III) secondary to severe stenosis OM1  3. Successful PTCA/DES x 1 OM1  4. Moderate disease stented segment of proximal LAD, FFR of 0.90 suggesting this lesion is not flow limiting.  5. Preserved LV systolic function  EKG:  EKG is ordered today. The ekg ordered today demonstrates Sinus bradycardia, rate 57 bpm  Recent Labs: No results found for requested labs within last 8760 hours.   Lipid Panel Lipid Panel     Component Value Date/Time   CHOL 173 12/10/2016 0812   TRIG 55 12/10/2016 0812   HDL 40 12/10/2016 0812   CHOLHDL 4.3 12/10/2016 0812   CHOLHDL 5 09/26/2012 0907   VLDL 23.6 09/26/2012 0907   LDLCALC 122 (H) 12/10/2016 0812     Wt Readings from Last 3 Encounters:  04/03/18 156 lb (70.8 kg)  11/29/17 160 lb 6.4 oz (72.8 kg)  12/03/16 159 lb (72.1 kg)     Other studies Reviewed: Additional studies/ records that were reviewed today include: . Review of the above records demonstrates:    Assessment and Plan:   1. CAD with stable angina: No change in his chronic stable angina. He has diffuse three vessel CAD. Last PCI was in 2014. One of 4 bypass grafts patent at time of last cath. He does not tolerate beta blockers due to bradycardia. Will continue  DAPT with ASA and Plavix. Continue Norvasc. He does not tolerate  statins.    2. HTN: BP is controlled. No changes  3. HLD: He is intolerant of statins. We have once again discussed Repatha or Praluent but he is not interested in trying one of these.    4. Sinus bradycardia: Asymptomatic  Current medicines are reviewed at length with the patient today.  The patient does not have concerns regarding medicines.  The following changes have been made:  no change  Labs/ tests ordered today include:   Orders Placed This Encounter  Procedures  . EKG 12-Lead    Disposition:   FU with me in 12 months  Signed, Verne Carrow, MD 04/03/2018 10:26 AM    [facility] [address] [place], [place], [place]  [address] Phone: [phone]; Fax: [phone]

## 2018-04-03 NOTE — Patient Instructions (Signed)

## 2018-05-30 ENCOUNTER — Other Ambulatory Visit: Payer: Self-pay | Admitting: Cardiovascular Disease

## 2018-12-12 ENCOUNTER — Telehealth: Payer: Self-pay | Admitting: Cardiovascular Disease

## 2018-12-12 NOTE — Telephone Encounter (Signed)
This does not sound like a blood clot or a vascular issue if there is no swelling or discoloration. I would suggest he call primary care if this continues. Thayer Ohm

## 2018-12-12 NOTE — Telephone Encounter (Signed)
New Message     Pts wife is calling and says the pt can feel his pulse in his right leg and feet. He has been experiencing this for a couple of days     Pleas call back

## 2018-12-12 NOTE — Telephone Encounter (Signed)
Spoke with patient and wife by speaker phone. Patient states he feels his pulse in hig right heel and then foot goes numb. States right foot feels different from left when he takes a step. He denies discoloration, injury, or difference in skin temperature Denies pain or swelling in calf or any part of the RLE. Denies pain when he walks Reports compliance with with aspirin and plavix. Patient reports he is concerned about a blood clot and wonders if he should go to an ED or urgen care. I advised that I will forward his message to Dr. Clifton James for advice but that I do not think it is necessary that he seek immediate attention. I advised that we can order a vascular ultrasound but that I would like to hear back from Dr. Clifton James first. Wife and patient verbalized understanding and agreement and thanked me for the call.

## 2018-12-12 NOTE — Telephone Encounter (Signed)
Spoke with patient's wife and reviewed Dr. Gibson Ramp advice with her. She verbalized understanding and thanked me for the call.

## 2018-12-13 ENCOUNTER — Ambulatory Visit (INDEPENDENT_AMBULATORY_CARE_PROVIDER_SITE_OTHER): Payer: PPO | Admitting: Family Medicine

## 2018-12-13 ENCOUNTER — Other Ambulatory Visit: Payer: Self-pay

## 2018-12-13 ENCOUNTER — Encounter: Payer: Self-pay | Admitting: Family Medicine

## 2018-12-13 VITALS — BP 130/60 | HR 74

## 2018-12-13 DIAGNOSIS — Z23 Encounter for immunization: Secondary | ICD-10-CM

## 2018-12-13 DIAGNOSIS — I10 Essential (primary) hypertension: Secondary | ICD-10-CM

## 2018-12-13 DIAGNOSIS — I2510 Atherosclerotic heart disease of native coronary artery without angina pectoris: Secondary | ICD-10-CM | POA: Diagnosis not present

## 2018-12-13 DIAGNOSIS — N183 Chronic kidney disease, stage 3 unspecified: Secondary | ICD-10-CM

## 2018-12-13 DIAGNOSIS — E7800 Pure hypercholesterolemia, unspecified: Secondary | ICD-10-CM

## 2018-12-13 NOTE — Patient Instructions (Addendum)
Your leg is fine. I agree with Dr. Sanjuana Kava that you would benefit from a cholesterol lowering medicine. If the memory reaches a point where you would like to try medication, let me know.  The medicine I start with is Aricept. I will call with the lab test results. Pneumonia vaccine today. You should see me once a year, not once every three years.

## 2018-12-14 ENCOUNTER — Encounter: Payer: Self-pay | Admitting: Family Medicine

## 2018-12-14 DIAGNOSIS — N183 Chronic kidney disease, stage 3 unspecified: Secondary | ICD-10-CM | POA: Insufficient documentation

## 2018-12-14 LAB — LIPID PANEL
Chol/HDL Ratio: 3.8 ratio (ref 0.0–5.0)
Cholesterol, Total: 204 mg/dL — ABNORMAL HIGH (ref 100–199)
HDL: 53 mg/dL (ref >39)
LDL Calculated: 131 mg/dL — ABNORMAL HIGH (ref 0–99)
Triglycerides: 99 mg/dL (ref 0–149)
VLDL Cholesterol Cal: 20 mg/dL (ref 5–40)

## 2018-12-14 LAB — CBC
Hematocrit: 42.1 % (ref 37.5–51.0)
Hemoglobin: 14.5 g/dL (ref 13.0–17.7)
MCH: 32.6 pg (ref 26.6–33.0)
MCHC: 34.4 g/dL (ref 31.5–35.7)
MCV: 95 fL (ref 79–97)
Platelets: 217 10*3/uL (ref 150–450)
RBC: 4.45 x10E6/uL (ref 4.14–5.80)
RDW: 13.5 % (ref 11.6–15.4)
WBC: 6.9 10*3/uL (ref 3.4–10.8)

## 2018-12-14 LAB — CMP14+EGFR
ALT: 12 IU/L (ref 0–44)
AST: 16 IU/L (ref 0–40)
Albumin/Globulin Ratio: 2.1 (ref 1.2–2.2)
Albumin: 4.5 g/dL (ref 3.6–4.6)
Alkaline Phosphatase: 97 IU/L (ref 39–117)
BUN/Creatinine Ratio: 18 (ref 10–24)
BUN: 25 mg/dL (ref 8–27)
Bilirubin Total: 0.8 mg/dL (ref 0.0–1.2)
CO2: 21 mmol/L (ref 20–29)
Calcium: 9.1 mg/dL (ref 8.6–10.2)
Chloride: 108 mmol/L — ABNORMAL HIGH (ref 96–106)
Creatinine, Ser: 1.41 mg/dL — ABNORMAL HIGH (ref 0.76–1.27)
GFR calc Af Amer: 54 mL/min/{1.73_m2} — ABNORMAL LOW (ref >59)
GFR calc non Af Amer: 46 mL/min/{1.73_m2} — ABNORMAL LOW (ref >59)
Globulin, Total: 2.1 g/dL (ref 1.5–4.5)
Glucose: 88 mg/dL (ref 65–99)
Potassium: 4.4 mmol/L (ref 3.5–5.2)
Sodium: 142 mmol/L (ref 134–144)
Total Protein: 6.6 g/dL (ref 6.0–8.5)

## 2018-12-14 NOTE — Assessment & Plan Note (Signed)
Encouraged to consider new cholesterol agents and discuss with dr. Sanjuana Kava.

## 2018-12-14 NOTE — Assessment & Plan Note (Signed)
CKD 3.  Focus on BP control

## 2018-12-14 NOTE — Assessment & Plan Note (Signed)
Well controled. Continue amlodipine.  

## 2018-12-14 NOTE — Assessment & Plan Note (Signed)
No angina.  No evidence of CHF.  Would benefit from cholesterol agent.

## 2018-12-14 NOTE — Progress Notes (Signed)
 Established Patient Office Visit  Subjective:  Patient ID: Steve Gordon, male    DOB: 05/26/1936  Age: 83 y.o. MRN: [id number]  CC:  Chief Complaint  Patient presents with  . Foot Problem    HPI Steve Gordon presents for foot throbbing and much more.  Steve Gordon has not been to see me in three years.  His reason for coming was a throbbing right foot in the heel area.  I usually cannot feel my pulse and now I can.  No swelling, redness, warmth, trauma or skin breakdown.  He is S/P CABG and veins were harvested from that leg.  CAD: Not on statin.  Yes on aspirin and plavix.  Focused mainly on statin.  He and wife state: He has been on many of them and they all make him hurt.  Apparently he has been offered alternative therapy and has declined.  Several times, he mentioned, "I am 83 years old and I don't expect to live forever."   HPDP, Due for pnuemonia vaccine.  He will take.  Memory: States memory is getting slowly worse.  Asked about Prevagin.  His mother had Altzheimers disease.  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    HBP.  On amlodipine without complaints.   Past Medical History:  Diagnosis Date  . CAD (coronary artery disease)    a. Ant MI 1998;  b. 2003: s/p CABG x 4 (VG->Diag, VG->OM, VG->PDA, LIMA->LAD);  c. PCI 2008: RCA - 4.0x16 Liberte BMS;  d. 07/2010 PCI: OM1- 3.5x20 Promus DES;  e. 03/2013 Cath/PCI: LM nl, LAD 60-70p/m (FFR 0.9->Med Rx), Diag 100, LCX patent prox stent, OM1 95-90(3.0x32 Promus Premier DES), RCA dom, 40m, 30 ISR, VG->Diag ok, VG->OM KTBO, VG->PDA KTBO, LIMA->LAD known atresia, EF 55%  . Dyslipidemia   . Hypertension   . Statin intolerance     Past Surgical History:  Procedure Laterality Date  . ANKLE FRACTURE SURGERY Left ~ 1999   "broke in 1993; casted; PCP said not broken; had MI; walked for rehab; wore ankle out; put in plates and screws" (03/26/2013)  . bronchogenic cyst resection  08/06/1997  . CHOLECYSTECTOMY    .  CORONARY ANGIOPLASTY WITH STENT PLACEMENT  1998-03/26/2013   "2 at one time in 1998; this one  today is the 3rd one I've had since [facility] in 2003" (03/26/2013)  . CORONARY ANGIOPLASTY WITH STENT PLACEMENT  10/05/2006   4.0mmx 16mm Liberte non DES  . CORONARY ARTERY BYPASS GRAFT  2003   x4 LIMA to LAD SVG digonal, OM, PDA  . INGUINAL HERNIA REPAIR Right    "3 times" (03/26/2013)  . LAPAROSCOPIC INCISIONAL / UMBILICAL / VENTRAL HERNIA REPAIR  2004  . LEFT HEART CATHETERIZATION WITH CORONARY/GRAFT ANGIOGRAM N/A 03/26/2013   Procedure: LEFT HEART CATHETERIZATION WITH CORONARY/GRAFT ANGIOGRAM;  Surgeon: Christopher D McAlhany, MD;  Location: [facility] CATH LAB;  Service: Cardiovascular;  Laterality: N/A;    Family History  Problem Relation Age of Onset  . Hypertension Other   . Heart disease Other   . Kidney cancer Other     Social History   Socioeconomic History  . Marital status: Married    Spouse name: Not on file  . Number of children: Not on file  . Years of education: Not on file  . Highest education level: Not on file  Occupational History  . Occupation: Retired, but works on cars part-time  Social Needs  . Financial resource strain: Not on file  .   Food insecurity:    Worry: Not on file    Inability: Not on file  . Transportation needs:    Medical: Not on file    Non-medical: Not on file  Tobacco Use  . Smoking status: Never Smoker  . Smokeless tobacco: Never Used  Substance and Sexual Activity  . Alcohol use: Yes    Comment: 03/26/2013 "couple shots some weeks; none for 2 months; just take a drink q now and then"  . Drug use: No  . Sexual activity: Yes  Lifestyle  . Physical activity:    Days per week: Not on file    Minutes per session: Not on file  . Stress: Not on file  Relationships  . Social connections:    Talks on phone: Not on file    Gets together: Not on file    Attends religious service: Not on file    Active member of club or organization: Not on file     Attends meetings of clubs or organizations: Not on file    Relationship status: Not on file  . Intimate partner violence:    Fear of current or ex partner: Not on file    Emotionally abused: Not on file    Physically abused: Not on file    Forced sexual activity: Not on file  Other Topics Concern  . Not on file  Social History Narrative   Level of education: 10th grade   Employment: retired, previously self employed and truck Lexicographer: self    Exercise: works on his cars    Housing situation: house    Relationships (safe): yes   Sport and exercise psychologist for message (voicemail): Wife Mariann Laster) ok to leave information with her. [phone]   Lives in [place] with wife. 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    Outpatient Medications Prior to Visit  Medication Sig Dispense Refill  . amLODipine (NORVASC) 10 MG tablet Take 1 tablet by mouth once daily 90 tablet 2  . aspirin EC 81 MG tablet Take 81 mg by mouth at bedtime.    . clopidogrel (PLAVIX) 75 MG tablet Take 1 tablet by mouth once daily 90 tablet 2  . fluticasone (FLONASE) 50 MCG/ACT nasal spray Place 2 sprays into both nostrils daily. 16 g 6  . nitroGLYCERIN (NITROSTAT) 0.4 MG SL tablet Place 1 tablet under the tongue every 5 minutes as needed for chest pain 75 tablet 2   No facility-administered medications prior to visit.     No Known Allergies  ROS Review of Systems    Denies CP, SOB, DOE, ankle edema or palpitations. Objective:    Physical Exam  BP 130/60   Pulse 74   SpO2 97%  Wt Readings from Last 3 Encounters:  04/03/18 156 lb (70.8 kg)  11/29/17 160 lb 6.4 oz (72.8 kg)  12/03/16 159 lb (72.1 kg)   Neck, no JVD Lungs clear Cardiac RRR without m or g Abd benign Ext no edema.  Careful inspection of the right foot and heel show normal skin, now swelling.  Good pedal pulses bilaterally.  Lack of hair suggests some element of PVD.  There are no preventive care reminders to display for this patient.  There are no  preventive care reminders to display for this patient.  Lab Results  Component Value Date   TSH 0.929 07/12/2015   Lab Results  Component Value Date   WBC 6.9 12/13/2018   HGB 14.5 12/13/2018   HCT 42.1 12/13/2018  MCV 95 12/13/2018   PLT 217 12/13/2018   Lab Results  Component Value Date   NA 142 12/13/2018   K 4.4 12/13/2018   CO2 21 12/13/2018   GLUCOSE 88 12/13/2018   BUN 25 12/13/2018   CREATININE 1.41 (H) 12/13/2018   BILITOT 0.8 12/13/2018   ALKPHOS 97 12/13/2018   AST 16 12/13/2018   ALT 12 12/13/2018   PROT 6.6 12/13/2018   ALBUMIN 4.5 12/13/2018   CALCIUM 9.1 12/13/2018   ANIONGAP 5 07/13/2015   GFR 55.65 (L) 03/22/2013   Lab Results  Component Value Date   CHOL 204 (H) 12/13/2018   Lab Results  Component Value Date   HDL 53 12/13/2018   Lab Results  Component Value Date   LDLCALC 131 (H) 12/13/2018   Lab Results  Component Value Date   TRIG 99 12/13/2018   Lab Results  Component Value Date   CHOLHDL 3.8 12/13/2018   No results found for: HGBA1C    Assessment & Plan:   Problem List Items Addressed This Visit    HYPERTENSION, BENIGN (Chronic)   Relevant Orders   CBC (Completed)   CMP14+EGFR (Completed)   Lipid panel (Completed)   CKD (chronic kidney disease) stage 3, GFR 30-59 ml/min (HCC)    CKD 3.  Focus on BP control       Other Visit Diagnoses    Need for 23-polyvalent pneumococcal polysaccharide vaccine    -  Primary   Relevant Orders   Pneumococcal polysaccharide vaccine 23-valent greater than or equal to 2yo subcutaneous/IM (Completed)      No orders of the defined types were placed in this encounter.   Follow-up: No follow-ups on file.     A , MD 

## 2018-12-15 ENCOUNTER — Telehealth: Payer: Self-pay | Admitting: *Deleted

## 2018-12-15 NOTE — Telephone Encounter (Signed)
Pts wife calls and was told by patient that Dr. Leveda Anna called him but pt was unsure of what he said.  Wife would like a callback from [place] and would like labs mailed to her. Jone Baseman, CMA

## 2018-12-15 NOTE — Telephone Encounter (Signed)
Called and gave information.  Encouraged to sign up for MyChart to review labs.

## 2019-02-06 ENCOUNTER — Other Ambulatory Visit: Payer: Self-pay | Admitting: Cardiovascular Disease

## 2019-06-15 ENCOUNTER — Encounter: Payer: Self-pay | Admitting: Cardiovascular Disease

## 2019-06-15 ENCOUNTER — Encounter (INDEPENDENT_AMBULATORY_CARE_PROVIDER_SITE_OTHER): Payer: Self-pay

## 2019-06-15 ENCOUNTER — Ambulatory Visit: Payer: PPO | Admitting: Cardiovascular Disease

## 2019-06-15 ENCOUNTER — Other Ambulatory Visit: Payer: Self-pay

## 2019-06-15 VITALS — BP 128/74 | HR 56 | Ht 69.0 in | Wt 163.0 lb

## 2019-06-15 DIAGNOSIS — E7800 Pure hypercholesterolemia, unspecified: Secondary | ICD-10-CM | POA: Diagnosis not present

## 2019-06-15 DIAGNOSIS — I25118 Atherosclerotic heart disease of native coronary artery with other forms of angina pectoris: Secondary | ICD-10-CM

## 2019-06-15 DIAGNOSIS — I10 Essential (primary) hypertension: Secondary | ICD-10-CM | POA: Diagnosis not present

## 2019-06-15 MED ORDER — NITROGLYCERIN 0.4 MG SL SUBL: SUBLINGUAL_TABLET | SUBLINGUAL | 3 refills | Status: DC

## 2019-06-15 MED ORDER — AMLODIPINE BESYLATE 10 MG PO TABS: 10.0000 mg | ORAL_TABLET | Freq: Every day | ORAL | 3 refills | Status: DC

## 2019-06-15 MED ORDER — CLOPIDOGREL BISULFATE 75 MG PO TABS: 75.0000 mg | ORAL_TABLET | Freq: Every day | ORAL | 3 refills | Status: DC

## 2019-06-15 NOTE — Patient Instructions (Signed)
Medication Instructions:  Your physician has recommended you make the following change in your medication:   STOP Aspirin  *If you need a refill on your cardiac medications before your next appointment, please call your pharmacy*  Lab Work: None ordered  If you have labs (blood work) drawn today and your tests are completely normal, you will receive your results only by: Marland Kitchen MyChart Message (if you have MyChart) OR . A paper copy in the mail If you have any lab test that is abnormal or we need to change your treatment, we will call you to review the results.  Testing/Procedures: None ordered  Follow-Up: At [facility], you and your health needs are our priority.  As part of our continuing mission to provide you with exceptional heart care, we have created designated Provider Care Teams.  These Care Teams include your primary Cardiologist (physician) and Advanced Practice Providers (APPs -  Physician Assistants and Nurse Practitioners) who all work together to provide you with the care you need, when you need it.  Your next appointment:   12 months  The format for your next appointment:   In Person  Provider:   You may see Lauree Chandler, MD or one of the following Advanced Practice Providers on your designated Care Team:    Melina Copa, PA-C  Ermalinda Barrios, PA-C   Other Instructions

## 2019-06-15 NOTE — Progress Notes (Signed)
Chief Complaint  Patient presents with  . Follow-up    CAD     History of Present Illness: 83 yo male with history of CAD s/p 4V CABG 2003 (LIMA to LAD, SVG to Diagonal, SVG to OM, SVG to PDA), HLD with statin intolerance who is here today for cardiac follow up. He had been followed in the past by Dr. Riley Kill. He had an anterior MI in 1998 at which time a bare metal stent was placed in the LAD. Cardiac cath 2003 with severe stenoses in the Diagonal, OM2 and PDA. He underwent 4V CABG in 2003. He then had PCI of the mid RCA in 2008 (4.0 x 16 mm Liberte bare metal stent) and PCI of the OM1 with DES in 2011 (3.5 x 20 mm Promus Premier). I saw him in August 2014 and he had chest pain c/w unstable angina. Repeat cath 03/26/13: prox to mid LAD 60-70% (unchanged), Diagonal occluded, OM1 85-90% dist to stent, mid RCA 40%, mid RCA stent patent with 30% ISR, SVG to Diagonal patent, SVG to OM known to be occluded, SVG to PDA known to be occluded, LIMA to LAD known to be atretic, EF 55%. FFR of LAD: 0.90 (not flow limiting). 3.0 x32 mm Promus premier DES placed in the OM1 overlapping old stent. 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 Echo December 2016 with normal LV function and mild MR. Event monitor December 2016 with sinus brady, PACs, no pauses or high grade AV block.   He is here today for follow up. The patient denies any chest pain, dyspnea, palpitations, lower extremity edema, orthopnea, PND, dizziness, near syncope or syncope.   Primary Care Physician: Moses Manners, MD   Past Medical History:  Diagnosis Date  . CAD (coronary artery disease)    a. Ant MI 1998;  b. 2003: s/p CABG x 4 (VG->Diag, VG->OM, VG->PDA, LIMA->LAD);  c. PCI 2008: RCA - 4.0x16 Liberte BMS;  d. 07/2010 PCI: OM1- 3.5x20 Promus DES;  e. 03/2013 Cath/PCI: LM nl, LAD 60-70p/m (FFR 0.9->Med Rx), Diag 100, LCX patent prox stent, OM1 95-90(3.0x32 Promus Premier DES), RCA dom, 20m, 30  ISR, VG->Diag ok, VG->OM KTBO, VG->PDA KTBO, LIMA->LAD known atresia, EF 55%  . Dyslipidemia   . Hypertension   . Statin intolerance     Past Surgical History:  Procedure Laterality Date  . ANKLE FRACTURE SURGERY Left ~ 1999   "broke in 1993; casted; PCP said not broken; had MI; walked for rehab; wore ankle out; put in plates and screws" (03/26/2013)  . bronchogenic cyst resection  08/06/1997  . CHOLECYSTECTOMY    . CORONARY ANGIOPLASTY WITH STENT PLACEMENT  1998-03/26/2013   "2 at one time in 1998; this one  today is the 3rd one I've had since OHS in 2003" (03/26/2013)  . CORONARY ANGIOPLASTY WITH STENT PLACEMENT  10/05/2006   4.42mmx 16mm Liberte non DES  . CORONARY ARTERY BYPASS GRAFT  2003   x4 LIMA to LAD SVG digonal, OM, PDA  . INGUINAL HERNIA REPAIR Right    "3 times" (03/26/2013)  . LAPAROSCOPIC INCISIONAL / UMBILICAL / VENTRAL HERNIA REPAIR  2004  . LEFT HEART CATHETERIZATION WITH CORONARY/GRAFT ANGIOGRAM N/A 03/26/2013   Procedure: LEFT HEART CATHETERIZATION WITH Isabel Caprice;  Surgeon: Kathleene Hazel, MD;  Location: [facility] CATH LAB;  Service: Cardiovascular;  Laterality: N/A;    Current Outpatient Medications  Medication Sig Dispense Refill  . amLODipine (NORVASC) 10 MG tablet Take 1  tablet (10 mg total) by mouth daily. 90 tablet 3  . clopidogrel (PLAVIX) 75 MG tablet Take 1 tablet (75 mg total) by mouth daily. 90 tablet 3  . fluticasone (FLONASE) 50 MCG/ACT nasal spray Place 2 sprays into both nostrils daily. 16 g 6  . nitroGLYCERIN (NITROSTAT) 0.4 MG SL tablet Place 1 tablet under the tongue every 5 minutes as needed for chest pain 75 tablet 3   No current facility-administered medications for this visit.     No Known Allergies  Social History   Socioeconomic History  . Marital status: Married    Spouse name: Not on file  . Number of children: Not on file  . Years of education: Not on file  . Highest education level: Not on file  Occupational History   . Occupation: Retired, but works on cars part-time  Social Needs  . Financial resource strain: Not on file  . Food insecurity    Worry: Not on file    Inability: Not on file  . Transportation needs    Medical: Not on file    Non-medical: Not on file  Tobacco Use  . Smoking status: Never Smoker  . Smokeless tobacco: Never Used  Substance and Sexual Activity  . Alcohol use: Yes    Comment: 03/26/2013 "couple shots some weeks; none for 2 months; just take a drink q now and then"  . Drug use: No  . Sexual activity: Yes  Lifestyle  . Physical activity    Days per week: Not on file    Minutes per session: Not on file  . Stress: Not on file  Relationships  . Social Herbalist on phone: Not on file    Gets together: Not on file    Attends religious service: Not on file    Active member of club or organization: Not on file    Attends meetings of clubs or organizations: Not on file    Relationship status: Not on file  . Intimate partner violence    Fear of current or ex partner: Not on file    Emotionally abused: Not on file    Physically abused: Not on file    Forced sexual activity: Not on file  Other Topics Concern  . Not on file  Social History Narrative   Level of education: 10th grade   Employment: retired, previously self employed and truck Lexicographer: self    Exercise: works on his cars    Housing situation: house    Relationships (safe): yes   Sport and exercise psychologist for message (voicemail): Wife Mariann Laster) ok to leave information with her. [phone]   Lives in [place] with wife. 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    Family History  Problem Relation Age of Onset  . Hypertension Other   . Heart disease Other   . Kidney cancer Other     Review of Systems:  As stated in the HPI and otherwise negative.   BP 128/74   Pulse (!) 56   Ht 5\' 9"  (1.753 m)   Wt 163 lb (73.9 kg)   SpO2 98%   BMI 24.07 kg/m   Physical Examination:  General: Well  developed, well nourished, NAD  HEENT: OP clear, mucus membranes moist  SKIN: warm, dry. No rashes. Neuro: No focal deficits  Musculoskeletal: Muscle strength 5/5 all ext  Psychiatric: Mood and affect normal  Neck: No JVD, no carotid bruits, no thyromegaly, no lymphadenopathy.  Lungs:Clear bilaterally,  no wheezes, rhonci, crackles Cardiovascular: Regular rate and rhythm. No murmurs, gallops or rubs. Abdomen:Soft. Bowel sounds present. Non-tender.  Extremities: No lower extremity edema. Pulses are 2 + in the bilateral DP/PT.  Echo December 2016: Left ventricle: The cavity size was normal. Wall thickness was normal. Systolic function was normal. The estimated ejection fraction was in the range of 55% to 60%. Wall motion was normal; there were no regional wall motion abnormalities. Features are consistent with a pseudonormal left ventricular filling pattern, with concomitant abnormal relaxation and increased filling pressure (grade 2 diastolic dysfunction). - Aortic valve: Trileaflet; mildly thickened, mildly calcified leaflets. - Mitral valve: There was mild regurgitation. - Left atrium: The atrium was mildly dilated. - Right atrium: The atrium was mildly dilated.  Cardiac cath 03/26/13: Hemodynamic Findings:  Central aortic pressure: 118/59  Left ventricular pressure: 121/8/12  Angiographic Findings:  Left main: No obstructive disease.  Left Anterior Descending Artery: Moderate to large caliber vessel that courses to the apex. The proximal to mid vessel stented segment with 60-70% restenosis, grossly unchanged from last cath in 2011. The distal vessel has diffuse mild stenosis. The diagonal branch is occluded but fills from the patent vein graft.  Circumflex Artery: Large caliber vessel with patent stent in proximal vessel extending into OM1. The first OM branch has a hazy, 85-90% stenosis distal to the stent. The AV groove Circumflex is small in caliber and has mild  plaque disease.  Right Coronary Artery: Large dominant vessel with 40% mid stenosis, patent mid stent with 30% restenosis, mild distal disease.  Graft Anatomy:  SVG to Diagonal is patent  SVG to OM known to be occluded and not injected  SVG to PDA known to be occluded and not injected  LIMA to mid LAD is known to be atretic and not injected  Left Ventricular Angiogram: LVEF=55%. 1+ MR  Impression:  1. Triple vessel CAD s/p CABG with 1/4 patent grafts, patent stents LAD and RCA.  2. Unstable angina (class III) secondary to severe stenosis OM1  3. Successful PTCA/DES x 1 OM1  4. Moderate disease stented segment of proximal LAD, FFR of 0.90 suggesting this lesion is not flow limiting.  5. Preserved LV systolic function  EKG:  EKG is ordered today. The ekg ordered today demonstrates Sinus bradycardia, rate 56 bpm.   Recent Labs: 12/13/2018: ALT 12; BUN 25; Creatinine, Ser 1.41; Hemoglobin 14.5; Platelets 217; Potassium 4.4; Sodium 142   Lipid Panel Lipid Panel     Component Value Date/Time   CHOL 204 (H) 12/13/2018 1503   TRIG 99 12/13/2018 1503   HDL 53 12/13/2018 1503   CHOLHDL 3.8 12/13/2018 1503   CHOLHDL 5 09/26/2012 0907   VLDL 23.6 09/26/2012 0907   LDLCALC 131 (H) 12/13/2018 1503     Wt Readings from Last 3 Encounters:  06/15/19 163 lb (73.9 kg)  04/03/18 156 lb (70.8 kg)  11/29/17 160 lb 6.4 oz (72.8 kg)     Other studies Reviewed: Additional studies/ records that were reviewed today include: . Review of the above records demonstrates:    Assessment and Plan:   1. CAD with stable angina: No change in occasional chest pains. He is known to have diffuse 3 vessel CAD s/p CABG and PCI. Last PCI was in 2014. One of 4 bypass grafts patent at time of last cath. He does not tolerate beta blockers due to bradycardia.  He does not tolerate statins.  Continue Plavix. Will stop ASA due to easy bruising.  2. HTN: BP is controlled  3. HLD: He is intolerant of statins. We have  discussed Repatha or Praluent in the past but he is not interested in trying one of these.    4. Sinus bradycardia: No dizziness.   Current medicines are reviewed at length with the patient today.  The patient does not have concerns regarding medicines.  The following changes have been made:  no change  Labs/ tests ordered today include:   Orders Placed This Encounter  Procedures  . EKG 12-Lead    Disposition:   FU with me in 12 months  Signed, Verne Carrowhristopher Neenah Canter, MD 06/15/2019 10:56 AM    [facility] [address], [place], [place]  [address] Phone: [phone]; Fax: [phone]

## 2019-09-20 ENCOUNTER — Ambulatory Visit (INDEPENDENT_AMBULATORY_CARE_PROVIDER_SITE_OTHER)
Admission: EM | Admit: 2019-09-20 | Discharge: 2019-09-20 | Disposition: A | Discharge status: Other Acute Inpatient Hospital | Payer: PPO | Source: Home / Self Care

## 2019-09-20 ENCOUNTER — Emergency Department (HOSPITAL_COMMUNITY): Payer: PPO

## 2019-09-20 ENCOUNTER — Emergency Department (HOSPITAL_COMMUNITY)
Admission: EM | Admit: 2019-09-20 | Discharge: 2019-09-20 | Disposition: A | Discharge status: Home / Self Care | Payer: PPO | Attending: Emergency Medicine | Admitting: Emergency Medicine

## 2019-09-20 ENCOUNTER — Telehealth: Payer: Self-pay

## 2019-09-20 ENCOUNTER — Ambulatory Visit: Payer: PPO | Admitting: Family Medicine

## 2019-09-20 ENCOUNTER — Encounter (HOSPITAL_COMMUNITY): Payer: Self-pay

## 2019-09-20 ENCOUNTER — Other Ambulatory Visit: Payer: Self-pay

## 2019-09-20 DIAGNOSIS — I2510 Atherosclerotic heart disease of native coronary artery without angina pectoris: Secondary | ICD-10-CM | POA: Diagnosis not present

## 2019-09-20 DIAGNOSIS — Z7901 Long term (current) use of anticoagulants: Secondary | ICD-10-CM | POA: Insufficient documentation

## 2019-09-20 DIAGNOSIS — I129 Hypertensive chronic kidney disease with stage 1 through stage 4 chronic kidney disease, or unspecified chronic kidney disease: Secondary | ICD-10-CM | POA: Diagnosis not present

## 2019-09-20 DIAGNOSIS — N183 Chronic kidney disease, stage 3 unspecified: Secondary | ICD-10-CM | POA: Diagnosis not present

## 2019-09-20 DIAGNOSIS — R42 Dizziness and giddiness: Secondary | ICD-10-CM | POA: Diagnosis not present

## 2019-09-20 DIAGNOSIS — Z79899 Other long term (current) drug therapy: Secondary | ICD-10-CM | POA: Insufficient documentation

## 2019-09-20 DIAGNOSIS — Z951 Presence of aortocoronary bypass graft: Secondary | ICD-10-CM | POA: Diagnosis not present

## 2019-09-20 DIAGNOSIS — I252 Old myocardial infarction: Secondary | ICD-10-CM | POA: Diagnosis not present

## 2019-09-20 DIAGNOSIS — R2681 Unsteadiness on feet: Secondary | ICD-10-CM | POA: Diagnosis not present

## 2019-09-20 DIAGNOSIS — R531 Weakness: Secondary | ICD-10-CM | POA: Diagnosis not present

## 2019-09-20 LAB — CBC
HCT: 46.7 % (ref 39.0–52.0)
Hemoglobin: 15.4 g/dL (ref 13.0–17.0)
MCH: 32.2 pg (ref 26.0–34.0)
MCHC: 33 g/dL (ref 30.0–36.0)
MCV: 97.5 fL (ref 80.0–100.0)
Platelets: 199 10*3/uL (ref 150–400)
RBC: 4.79 MIL/uL (ref 4.22–5.81)
RDW: 12.8 % (ref 11.5–15.5)
WBC: 6 10*3/uL (ref 4.0–10.5)
nRBC: 0 % (ref 0.0–0.2)

## 2019-09-20 LAB — URINALYSIS, ROUTINE W REFLEX MICROSCOPIC
Bacteria, UA: NONE SEEN
Bilirubin Urine: NEGATIVE
Glucose, UA: NEGATIVE mg/dL
Ketones, ur: 5 mg/dL — AB
Leukocytes,Ua: NEGATIVE
Nitrite: NEGATIVE
Protein, ur: NEGATIVE mg/dL
Specific Gravity, Urine: 1.012 (ref 1.005–1.030)
pH: 6 (ref 5.0–8.0)

## 2019-09-20 LAB — BASIC METABOLIC PANEL
Anion gap: 11 (ref 5–15)
BUN: 13 mg/dL (ref 8–23)
CO2: 22 mmol/L (ref 22–32)
Calcium: 9.2 mg/dL (ref 8.9–10.3)
Chloride: 108 mmol/L (ref 98–111)
Creatinine, Ser: 1.27 mg/dL — ABNORMAL HIGH (ref 0.61–1.24)
GFR calc Af Amer: >60 mL/min (ref >60)
GFR calc non Af Amer: 53 mL/min — ABNORMAL LOW (ref >60)
Glucose, Bld: 104 mg/dL — ABNORMAL HIGH (ref 70–99)
Potassium: 4.2 mmol/L (ref 3.5–5.1)
Sodium: 141 mmol/L (ref 135–145)

## 2019-09-20 LAB — CBG MONITORING, ED: Glucose-Capillary: 88 mg/dL (ref 70–99)

## 2019-09-20 MED ORDER — MECLIZINE HCL 25 MG PO TABS
25.0000 mg | ORAL_TABLET | Freq: Once | ORAL | Status: AC
  Administered 2019-09-20: 25 mg via ORAL
  Filled 2019-09-20: qty 1

## 2019-09-20 MED ORDER — MECLIZINE HCL 25 MG PO TABS: 25.0000 mg | ORAL_TABLET | Freq: Three times a day (TID) | ORAL | 0 refills | Status: DC | PRN

## 2019-09-20 MED ORDER — SODIUM CHLORIDE 0.9% FLUSH: 3.0000 mL | Freq: Once | INTRAVENOUS | Status: DC

## 2019-09-20 NOTE — ED Notes (Signed)
Pt returned from CT °

## 2019-09-20 NOTE — ED Provider Notes (Signed)
[facility]    CSN: [id number] Arrival date & time: 09/20/19  1336      History   Chief Complaint Chief Complaint  Patient presents with  . Appointment  . (2:00 pm DIZZINESS)    HPI NYAIR DEPAULO is a 84 y.o. male history of CAD, hypertension, hyperlipidemia, CKD, presenting today for evaluation of dizziness.  Patient woke up this morning around 2:30 AM feeling unsteady on his feet.  He states initially he did notice his alarm clock seem to be spinning, but denies persistent spinning symptoms.  He states he "feels drunk".  Has difficulty walking without somebody stabilizing him.  Is on Plavix from multiple prior stents, denies prior CVA.  Denies any fall or hitting head.  Denies headache or vision changes.  Denies nausea or vomiting.  Denies chest pain or shortness of breath.  HPI  Past Medical History:  Diagnosis Date  . CAD (coronary artery disease)    a. Ant MI 1998;  b. 2003: s/p CABG x 4 (VG->Diag, VG->OM, VG->PDA, LIMA->LAD);  c. PCI 2008: RCA - 4.0x16 Liberte BMS;  d. 07/2010 PCI: OM1- 3.5x20 Promus DES;  e. 03/2013 Cath/PCI: LM nl, LAD 60-70p/m (FFR 0.9->Med Rx), Diag 100, LCX patent prox stent, OM1 95-90(3.0x32 Promus Premier DES), RCA dom, 51m, 30 ISR, VG->Diag ok, VG->OM KTBO, VG->PDA KTBO, LIMA->LAD known atresia, EF 55%  . Dyslipidemia   . Hypertension   . Statin intolerance     Patient Active Problem List   Diagnosis Date Noted  . CKD (chronic kidney disease) stage 3, GFR 30-59 ml/min 12/14/2018  . Allergic rhinitis 11/29/2017  . Rash and nonspecific skin eruption 11/12/2015  . Coronary artery disease involving native coronary artery of native heart without angina pectoris   . Hyperlipidemia   . Syncope 07/12/2015  . Sinus bradycardia 12/14/2011  . Incidental lung nodule, > 56mm and < 27mm 11/25/2010  . Coronary artery disease 11/25/2010  . HYPERTENSION, BENIGN 08/26/2010  . HYPERCHOLESTEROLEMIA  IIA 03/07/2009  . CAD, ARTERY BYPASS GRAFT 03/07/2009     Past Surgical History:  Procedure Laterality Date  . ANKLE FRACTURE SURGERY Left ~ 1999   "broke in 1993; casted; PCP said not broken; had MI; walked for rehab; wore ankle out; put in plates and screws" (03/26/2013)  . bronchogenic cyst resection  08/06/1997  . CHOLECYSTECTOMY    . CORONARY ANGIOPLASTY WITH STENT PLACEMENT  1998-03/26/2013   "2 at one time in 1998; this one  today is the 3rd one I've had since [facility] in 2003" (03/26/2013)  . CORONARY ANGIOPLASTY WITH STENT PLACEMENT  10/05/2006   4.2mmx 28mm Liberte non DES  . CORONARY ARTERY BYPASS GRAFT  2003   x4 LIMA to LAD SVG digonal, OM, PDA  . INGUINAL HERNIA REPAIR Right    "3 times" (03/26/2013)  . LAPAROSCOPIC INCISIONAL / UMBILICAL / VENTRAL HERNIA REPAIR  2004  . LEFT HEART CATHETERIZATION WITH CORONARY/GRAFT ANGIOGRAM N/A 03/26/2013   Procedure: LEFT HEART CATHETERIZATION WITH Isabel Caprice;  Surgeon: Kathleene Hazel, MD;  Location: [facility] CATH LAB;  Service: Cardiovascular;  Laterality: N/A;       Home Medications    Prior to Admission medications   Medication Sig Start Date End Date Taking? Authorizing Provider  amLODipine (NORVASC) 10 MG tablet Take 1 tablet (10 mg total) by mouth daily. 06/15/19   Kathleene Hazel, MD  clopidogrel (PLAVIX) 75 MG tablet Take 1 tablet (75 mg total) by mouth daily. 06/15/19   Kathleene Hazel, MD  fluticasone (FLONASE) 50 MCG/ACT nasal spray Place 2 sprays into both nostrils daily. 11/29/17   Almon Hercules, MD  nitroGLYCERIN (NITROSTAT) 0.4 MG SL tablet Place 1 tablet under the tongue every 5 minutes as needed for chest pain 06/15/19   Kathleene Hazel, MD    Family History Family History  Problem Relation Age of Onset  . Hypertension Other   . Heart disease Other   . Kidney cancer Other     Social History Social History   Tobacco Use  . Smoking status: Never Smoker  . Smokeless tobacco: Never Used  Substance Use Topics  . Alcohol use: Yes     Comment: 03/26/2013 "couple shots some weeks; none for 2 months; just take a drink q now and then"  . Drug use: No     Allergies   Patient has no known allergies.   Review of Systems Review of Systems  Constitutional: Negative for fatigue and fever.  HENT: Negative for congestion, sinus pressure and sore throat.   Eyes: Negative for photophobia, pain and visual disturbance.  Respiratory: Negative for cough and shortness of breath.   Cardiovascular: Negative for chest pain.  Gastrointestinal: Negative for abdominal pain, nausea and vomiting.  Genitourinary: Negative for decreased urine volume and hematuria.  Musculoskeletal: Negative for myalgias, neck pain and neck stiffness.  Neurological: Positive for dizziness. Negative for syncope, facial asymmetry, speech difficulty, weakness, light-headedness, numbness and headaches.     Physical Exam Triage Vital Signs ED Triage Vitals [09/20/19 1420]  Enc Vitals Group     BP (!) 158/81     Pulse Rate 72     Resp 16     Temp 98.1 F (36.7 C)     Temp Source Oral     SpO2 97 %     Weight      Height      Head Circumference      Peak Flow      Pain Score 1     Pain Loc      Pain Edu?      Excl. in GC?    No data found.  Updated Vital Signs BP (!) 158/81 (BP Location: Right Arm)   Pulse 72   Temp 98.1 F (36.7 C) (Oral)   Resp 16   SpO2 97%   Visual Acuity Right Eye Distance:   Left Eye Distance:   Bilateral Distance:    Right Eye Near:   Left Eye Near:    Bilateral Near:     Physical Exam Vitals and nursing note reviewed.  Constitutional:      Appearance: He is well-developed.     Comments: No acute distress  HENT:     Head: Normocephalic and atraumatic.     Nose: Nose normal.  Eyes:     Conjunctiva/sclera: Conjunctivae normal.  Cardiovascular:     Rate and Rhythm: Normal rate.  Pulmonary:     Effort: Pulmonary effort is normal. No respiratory distress.  Abdominal:     General: There is no distension.    Musculoskeletal:        General: Normal range of motion.     Cervical back: Neck supple.  Skin:    General: Skin is warm and dry.  Neurological:     Mental Status: He is alert and oriented to person, place, and time.     Comments: Patient A&O x3, cranial nerves II-XII grossly intact, strength at shoulders, hips and knees 5/5, equal bilaterally, patellar reflex 3+ bilaterally.  Normal RAM and heel to shin. Negative Romberg, but does have significant swaying and movement with Romberg.  Gait is unsteady.      UC Treatments / Results  Labs (all labs ordered are listed, but only abnormal results are displayed) Labs Reviewed - No data to display  EKG   Radiology No results found.  Procedures Procedures (including critical care time)  Medications Ordered in UC Medications - No data to display  Initial Impression / Assessment and Plan / UC Course  I have reviewed the triage vital signs and the nursing notes.  Pertinent labs & imaging results that were available during my care of the patient were reviewed by me and considered in my medical decision making (see chart for details).    EKG normal sinus rhythm, no acute signs of ischemia or infarction. Given patient's age, CAD history, symptoms not classic vertigo, recommending further evaluation in emergency room for stroke rule out.  Transported with nursing staff to ED.    Discussed strict return precautions. Patient verbalized understanding and is agreeable with plan.  Final Clinical Impressions(s) / UC Diagnoses   Final diagnoses:  Dizziness  Unsteady gait     Discharge Instructions     Please go to ED for further evaluation and stroke rule out   ED Prescriptions    None     PDMP not reviewed this encounter.   Janith Lima, [place] 09/20/19 1523

## 2019-09-20 NOTE — Discharge Instructions (Signed)
Please go to ED for further evaluation and stroke rule out

## 2019-09-20 NOTE — ED Provider Notes (Addendum)
[facility] Provider Note   CSN: [id number] Arrival date & time: 09/20/19  1520     History Chief Complaint  Patient presents with  . Weakness    Steve Gordon is a 84 y.o. male.  Patient sent over appropriately from urgent care.  Patient with onset of dizziness about 2 in the morning when he got up to pee.  It has persisted.  Even upon coming here had to hold onto somebody or the walls to be able to get around.  But at rest he has no room spinning.  No nausea or vomiting.  There was a report of left-sided facial droop patient denies that.  Denies any trouble eating this morning or any trouble talking and there is no evidence of that currently.  Patient has not had any history of dizziness or vertigo in the past.  Past medical history is significant for hypertension and coronary artery disease.  Patient is on Plavix as well.        Past Medical History:  Diagnosis Date  . CAD (coronary artery disease)    a. Ant MI 1998;  b. 2003: s/p CABG x 4 (VG->Diag, VG->OM, VG->PDA, LIMA->LAD);  c. PCI 2008: RCA - 4.0x16 Liberte BMS;  d. 07/2010 PCI: OM1- 3.5x20 Promus DES;  e. 03/2013 Cath/PCI: LM nl, LAD 60-70p/m (FFR 0.9->Med Rx), Diag 100, LCX patent prox stent, OM1 95-90(3.0x32 Promus Premier DES), RCA dom, 65m, 30 ISR, VG->Diag ok, VG->OM KTBO, VG->PDA KTBO, LIMA->LAD known atresia, EF 55%  . Dyslipidemia   . Hypertension   . Statin intolerance     Patient Active Problem List   Diagnosis Date Noted  . CKD (chronic kidney disease) stage 3, GFR 30-59 ml/min 12/14/2018  . Allergic rhinitis 11/29/2017  . Rash and nonspecific skin eruption 11/12/2015  . Coronary artery disease involving native coronary artery of native heart without angina pectoris   . Hyperlipidemia   . Syncope 07/12/2015  . Sinus bradycardia 12/14/2011  . Incidental lung nodule, > 73mm and < 26mm 11/25/2010  . Coronary artery disease 11/25/2010  . HYPERTENSION, BENIGN 08/26/2010    . HYPERCHOLESTEROLEMIA  IIA 03/07/2009  . CAD, ARTERY BYPASS GRAFT 03/07/2009    Past Surgical History:  Procedure Laterality Date  . ANKLE FRACTURE SURGERY Left ~ 1999   "broke in 1993; casted; PCP said not broken; had MI; walked for rehab; wore ankle out; put in plates and screws" (8/33/8250)  . bronchogenic cyst resection  08/06/1997  . CHOLECYSTECTOMY    . CORONARY ANGIOPLASTY WITH STENT PLACEMENT  1998-03/26/2013   "2 at one time in 1998; this one  today is the 3rd one I've had since [facility] in 2003" (03/26/2013)  . CORONARY ANGIOPLASTY WITH STENT PLACEMENT  10/05/2006   4.50mmx 60mm Liberte non DES  . CORONARY ARTERY BYPASS GRAFT  2003   x4 LIMA to LAD SVG digonal, OM, PDA  . INGUINAL HERNIA REPAIR Right    "3 times" (03/26/2013)  . LAPAROSCOPIC INCISIONAL / UMBILICAL / VENTRAL HERNIA REPAIR  2004  . LEFT HEART CATHETERIZATION WITH CORONARY/GRAFT ANGIOGRAM N/A 03/26/2013   Procedure: LEFT HEART CATHETERIZATION WITH Beatrix Fetters;  Surgeon: Burnell Blanks, MD;  Location: [facility] CATH LAB;  Service: Cardiovascular;  Laterality: N/A;       Family History  Problem Relation Age of Onset  . Hypertension Other   . Heart disease Other   . Kidney cancer Other     Social History   Tobacco Use  .  Smoking status: Never Smoker  . Smokeless tobacco: Never Used  Substance Use Topics  . Alcohol use: Yes    Comment: 03/26/2013 "couple shots some weeks; none for 2 months; just take a drink q now and then"  . Drug use: No    Home Medications Prior to Admission medications   Medication Sig Start Date End Date Taking? Authorizing Provider  amLODipine (NORVASC) 10 MG tablet Take 1 tablet (10 mg total) by mouth daily. 06/15/19  Yes Kathleene Hazel, MD  clopidogrel (PLAVIX) 75 MG tablet Take 1 tablet (75 mg total) by mouth daily. 06/15/19  Yes Kathleene Hazel, MD  nitroGLYCERIN (NITROSTAT) 0.4 MG SL tablet Place 1 tablet under the tongue every 5 minutes as needed for  chest pain 06/15/19  Yes Kathleene Hazel, MD  fluticasone (FLONASE) 50 MCG/ACT nasal spray Place 2 sprays into both nostrils daily. Patient not taking: Reported on 09/20/2019 11/29/17   Almon Hercules, MD    Allergies    Patient has no known allergies.  Review of Systems   Review of Systems  Constitutional: Negative for chills and fever.  HENT: Negative for congestion, rhinorrhea and sore throat.   Eyes: Negative for visual disturbance.  Respiratory: Negative for cough and shortness of breath.   Cardiovascular: Negative for chest pain and leg swelling.  Gastrointestinal: Negative for abdominal pain, diarrhea, nausea and vomiting.  Genitourinary: Negative for dysuria.  Musculoskeletal: Negative for back pain and neck pain.  Skin: Negative for rash.  Neurological: Positive for dizziness. Negative for syncope, facial asymmetry, speech difficulty, weakness, light-headedness, numbness and headaches.  Hematological: Does not bruise/bleed easily.  Psychiatric/Behavioral: Negative for confusion.    Physical Exam Updated Vital Signs BP 140/79   Pulse 66   Temp 98.6 F (37 C) (Oral)   Resp 11   SpO2 98%   Physical Exam Vitals and nursing note reviewed.  Constitutional:      Appearance: Normal appearance. He is well-developed.  HENT:     Head: Normocephalic and atraumatic.  Eyes:     Extraocular Movements: Extraocular movements intact.     Conjunctiva/sclera: Conjunctivae normal.     Pupils: Pupils are equal, round, and reactive to light.  Cardiovascular:     Rate and Rhythm: Normal rate and regular rhythm.     Heart sounds: No murmur.  Pulmonary:     Effort: Pulmonary effort is normal. No respiratory distress.     Breath sounds: Normal breath sounds.  Abdominal:     Palpations: Abdomen is soft.     Tenderness: There is no abdominal tenderness.  Musculoskeletal:        General: No swelling. Normal range of motion.     Cervical back: Normal range of motion and neck  supple.  Skin:    General: Skin is warm and dry.     Capillary Refill: Capillary refill takes less than 2 seconds.  Neurological:     General: No focal deficit present.     Mental Status: He is alert and oriented to person, place, and time.     Cranial Nerves: No cranial nerve deficit.     Sensory: No sensory deficit.     Motor: No weakness.     Coordination: Coordination normal.     Comments: Not able to reproduce any vertigo or dizziness movement of the head.  Patient's neuro exam while in the bed without any acute abnormalities.  No weakness no sensory deficits coordination reasonably intact for his age.  ED Results / Procedures / Treatments   Labs (all labs ordered are listed, but only abnormal results are displayed) Labs Reviewed  BASIC METABOLIC PANEL - Abnormal; Notable for the following components:      Result Value   Glucose, Bld 104 (*)    Creatinine, Ser 1.27 (*)    GFR calc non Af Amer 53 (*)    All other components within normal limits  CBC  URINALYSIS, ROUTINE W REFLEX MICROSCOPIC  CBG MONITORING, ED    EKG EKG Interpretation  Date/Time:  Thursday September 20 2019 15:39:39 EST Ventricular Rate:  67 PR Interval:  136 QRS Duration: 96 QT Interval:  396 QTC Calculation: 418 R Axis:   58 Text Interpretation: Normal sinus rhythm Normal ECG Confirmed by Vanetta Mulders [phone]) on 09/20/2019 4:29:36 PM   Radiology CT Head Wo Contrast  Result Date: 09/20/2019 CLINICAL DATA:  Dizziness EXAM: CT HEAD WITHOUT CONTRAST TECHNIQUE: Contiguous axial images were obtained from the base of the skull through the vertex without intravenous contrast. COMPARISON:  July 12, 2015 FINDINGS: Brain: No evidence of acute territorial infarction, hemorrhage, hydrocephalus,extra-axial collection or mass lesion/mass effect. There is dilatation the ventricles and sulci consistent with age-related atrophy. Low-attenuation changes in the deep white matter consistent with small vessel  ischemia. Vascular: No hyperdense vessel or unexpected calcification. Skull: The skull is intact. No fracture or focal lesion identified. Sinuses/Orbits: There is complete opacification of the left maxillary sinus with ethmoid air cell mucosal thickening. The orbits and globes intact. Other: None IMPRESSION: No acute intracranial abnormality. Findings consistent with age related atrophy and chronic small vessel ischemia Left maxillary and to a lesser extent ethmoid air cell sinusitis. Electronically Signed   By: Jonna Clark M.D.   On: 09/20/2019 18:58    Procedures Procedures (including critical care time)  Medications Ordered in ED Medications  sodium chloride flush (NS) 0.9 % injection 3 mL (3 mLs Intravenous Not Given 09/20/19 1754)  meclizine (ANTIVERT) tablet 25 mg (25 mg Oral Given 09/20/19 1755)    ED Course  I have reviewed the triage vital signs and the nursing notes.  Pertinent labs & imaging results that were available during my care of the patient were reviewed by me and considered in my medical decision making (see chart for details).    MDM Rules/Calculators/A&P                      Patient with history of onset of vertigo type symptoms 2 in the morning that have persisted.  But not able to elicit them while he is in bed.  Head CT negative.  Labs without significant abnormality.  Based on his age and all concerning for possible stroke as the cause of the ataxia.  So MRI has been ordered.  MRI brain negative for any evidence of stroke.  Patient able to stand.  Still has a little bit of dizziness.  Patient wants to go home.  Will give patient referral to neurology.  Main concern for the vertigo has been ruled out no evidence of stroke.  Patient with some improvement with Antivert.  Will redose here.  And then will also give patient a prescription for Antivert to take at home.  Final Clinical Impression(s) / ED Diagnoses Final diagnoses:  Vertigo    Rx / DC Orders ED  Discharge Orders    None       Vanetta Mulders, MD 09/20/19 [id number]    Vanetta Mulders, MD 09/20/19 [phone]

## 2019-09-20 NOTE — Telephone Encounter (Signed)
Noted and agree. 

## 2019-09-20 NOTE — ED Triage Notes (Deleted)
Pt presents with dizziness since waking up this morning.  

## 2019-09-20 NOTE — ED Notes (Signed)
Pt given urinal and advised that we need a urine sample and he should let us know when he pees.

## 2019-09-20 NOTE — ED Triage Notes (Signed)
Pt presents with dizziness since waking up this morning.  Pt had first covid vaccine September 15, 2019 Second vaccine due 10/05/2019

## 2019-09-20 NOTE — ED Notes (Signed)
Patient is being discharged from the Urgent Care Center and sent to the Emergency Department via wheelchair by staff. Per provider Patterson Hammersmith, patient is stable but in need of higher level of care due to unknown dizziness. Patient is aware and verbalizes understanding of plan of care.    Vitals:   09/20/19 1420  BP: (!) 158/81  Pulse: 72  Resp: 16  Temp: 98.1 F (36.7 C)  SpO2: 97%

## 2019-09-20 NOTE — Telephone Encounter (Signed)
Patient's wife [name]), calls nursing line regarding new onset of dizziness. States that dizziness started early this morning and is occurring with position changes and walking. Denies chest pain, shortness of breath or difficulty breathing. Wanted to schedule office visit to be evaluated, however, no available appointments. Assisted patient in reserving spot at [facility] to be evaluated.   To PCP  Veronda Prude, RN

## 2019-09-20 NOTE — Discharge Instructions (Signed)
Call neurology for follow-up if symptoms do not improve in a week.  Follow-up with your primary care doctor in the next few days or first part of next week.  Take the Antivert as needed for the dizziness.  Extensive work-up here today without any acute findings to include MRI brain which is ruled out stroke.  The dizziness we call vertigo may very well be secondary to an inner ear infection.  Antibiotics not needed.  It is safe for you to go home.  But because of the dizziness be very careful.

## 2019-09-20 NOTE — ED Triage Notes (Addendum)
Pt presents w/new onset of dizziness and weakness starting 0200 this am. Pt denies N/V/D or SOB. Neuro intact, Left side facial droop

## 2019-09-20 NOTE — ED Notes (Addendum)
Pt transported to CT ?

## 2019-09-26 ENCOUNTER — Ambulatory Visit (INDEPENDENT_AMBULATORY_CARE_PROVIDER_SITE_OTHER): Payer: PPO | Admitting: Family Medicine

## 2019-09-26 ENCOUNTER — Other Ambulatory Visit: Payer: Self-pay

## 2019-09-26 ENCOUNTER — Encounter: Payer: Self-pay | Admitting: Family Medicine

## 2019-09-26 DIAGNOSIS — H8110 Benign paroxysmal vertigo, unspecified ear: Secondary | ICD-10-CM | POA: Insufficient documentation

## 2019-09-26 HISTORY — DX: Benign paroxysmal vertigo, unspecified ear: H81.10

## 2019-09-26 MED ORDER — MECLIZINE HCL 25 MG PO TABS: 25.0000 mg | ORAL_TABLET | Freq: Three times a day (TID) | ORAL | 0 refills | Status: DC | PRN

## 2019-09-26 NOTE — Assessment & Plan Note (Signed)
Patient was seen in the emergency room on 09/20/2019 and diagnosed with vertigo.  He had CT head as well as an MRI to rule out CVA.  Patient reported that he felt like the room was moving.  It was worse with certain head movements.  He was prescribed meclizine 25 mg 3 times a day in the emergency room.  Since that time has symptoms have improved but he still has issues when he moves certain ways.  Dix-Hallpike today could not elicit the dizziness.  Patient is requesting a few more days of the meclizine to help with the dizziness.  No red flag symptoms noted on exam. -Discussed risks of continuation of meclizine and encourage patient to wean. -Wrote a prescription for 20 additional doses of 25 mg meclizine and patient reported he will only take it if he absolutely had to -Gave strict return precautions, patient and patient's wife understand

## 2019-09-26 NOTE — Progress Notes (Signed)
   CHIEF COMPLAINT / HPI: Dizziness Patient reports that he had an episode of dizziness on Thursday, 6 days ago.  He was seen at the urgent care and given his extensive cardiac history was sent to the emergency room for further work-up for stroke concerns.  Scans in the emergency room indicated no stroke and further testing revealed what was most likely BPPV.  Since that time patient feels like his symptoms of gotten better but that he is still having intermittent dizziness.  He has been taking meclizine 3 times a day to help with the dizziness which he feels is helping a lot.  He is out of meclizine and would like a refill on his prescription.  I discussed the issues with long-term meclizine use and discussed the fact that is on the beers list and can cause confusion as well as urinary retention.  Patient and his wife deny any issues with confusion or urinary retention.  PERTINENT  PMH / PSH: Coronary artery disease, BPPV   OBJECTIVE: BP 140/70   Pulse 75   Wt 166 lb 12.8 oz (75.7 kg)   SpO2 98%   BMI 24.63 kg/m   General: Patient is sitting up in chair when I enter the room.  He is very pleasant.  He reports difficulty hearing at baseline Eyes: No nystagmus noted Cardiac: Regular rate and rhythm, no murmurs appreciated Respiratory: Clear to auscultation bilaterally, no wheezes noted Neuro: Cranial nerves intact, Dix-Hallpike maneuver unsuccessful eliciting vertigo symptoms  ASSESSMENT / PLAN:  BPPV (benign paroxysmal positional vertigo), unspecified laterality Patient was seen in the emergency room on 09/20/2019 and diagnosed with vertigo.  He had CT head as well as an MRI to rule out CVA.  Patient reported that he felt like the room was moving.  It was worse with certain head movements.  He was prescribed meclizine 25 mg 3 times a day in the emergency room.  Since that time has symptoms have improved but he still has issues when he moves certain ways.  Dix-Hallpike today could not elicit  the dizziness.  Patient is requesting a few more days of the meclizine to help with the dizziness.  No red flag symptoms noted on exam. -Discussed risks of continuation of meclizine and encourage patient to wean. -Wrote a prescription for 20 additional doses of 25 mg meclizine and patient reported he will only take it if he absolutely had to -Gave strict return precautions, patient and patient's wife understand   Derrel Nip, MD [facility]

## 2019-09-26 NOTE — Patient Instructions (Addendum)
It was a pleasure to meet you today.  I am glad that your vertigo seems to be getting better.  I am going to send you another 20 pills of meclizine.  I would like you to start decreasing the amount that you are taking to twice a day and then eventually once a day and then stopping completely.  Your symptoms should resolve over time.  If you have any symptoms of headaches, worsening dizziness, nausea, vomiting, confusion, urinary retention please seek medical attention immediately.  I happy have a wonderful afternoon!

## 2019-10-15 ENCOUNTER — Other Ambulatory Visit: Payer: Self-pay

## 2019-10-15 ENCOUNTER — Ambulatory Visit (INDEPENDENT_AMBULATORY_CARE_PROVIDER_SITE_OTHER): Payer: PPO | Admitting: Family Medicine

## 2019-10-15 ENCOUNTER — Encounter: Payer: Self-pay | Admitting: Family Medicine

## 2019-10-15 DIAGNOSIS — I10 Essential (primary) hypertension: Secondary | ICD-10-CM | POA: Diagnosis not present

## 2019-10-15 DIAGNOSIS — I872 Venous insufficiency (chronic) (peripheral): Secondary | ICD-10-CM | POA: Diagnosis not present

## 2019-10-15 DIAGNOSIS — H8110 Benign paroxysmal vertigo, unspecified ear: Secondary | ICD-10-CM | POA: Diagnosis not present

## 2019-10-15 MED ORDER — AMLODIPINE BESYLATE 10 MG PO TABS: 5.0000 mg | ORAL_TABLET | Freq: Every day | ORAL | 3 refills | Status: DC

## 2019-10-15 MED ORDER — TRIAMCINOLONE ACETONIDE 0.5 % EX OINT: 1.0000 "application " | TOPICAL_OINTMENT | Freq: Two times a day (BID) | CUTANEOUS | 6 refills | Status: DC

## 2019-10-15 NOTE — Patient Instructions (Addendum)
The problem last month seems clearly in retrospect to be an inner ear problem.  I am glad it is gone. The leg problem is from chronic venous insufficiency causing venous stasis dermatitis.  The real problem is the leg swelling. I will send in an ointment to use on the rash. I will give you a hand written prescription for the compression stockings. Let's try cutting the amlodipine dose in half.  That may help the leg swelling. Check his blood pressure.  We want the top number to be 140 or less on average.   I am not worried about the bottom number being too high.  Actually, I worry that it will get too low.  It should not be less than 60.  I would also worry about it being too low if he felt lightheaded on standing.

## 2019-10-15 NOTE — Assessment & Plan Note (Signed)
Sx have resolved.  No further WU at this time.

## 2019-10-15 NOTE — Assessment & Plan Note (Signed)
Will treat rash with triamcinolone.  Educated that swelling was the culprit.  Prescribed compression stockings.  Also decrease amlodipine dose to 5 and keep an eye on BP

## 2019-10-15 NOTE — Progress Notes (Addendum)
    SUBJECTIVE:   CHIEF COMPLAINT / HPI:   Multiple issues. 1. FU episode of vertigo.  Now resolved.  Seen in [facility] by Cresen[place].  Dxed with BPPV.  Clearly described room spinning 2. Now also describes some lightheadedness on standing, different that vertigo described in #1.  His low diastolic BP is noted.  On several antihypertensive meds.  He and wife have a BP machine at home 3. Chronic rash on right leg.  Pruritic.  He was not aware of swelling (See PE).  Right leg had the saphonous vein harvested for CABG. (note - he is on amlodipine which has leg swelling as a side effect.    OBJECTIVE:   BP 134/66   Pulse 72   Wt 166 lb 3.2 oz (75.4 kg)   SpO2 98%   BMI 24.54 kg/m   3+ pitting edema or Right leg - 1- 2+ on left. Skin changes consistent with chronic venous insufficiency.   ASSESSMENT/PLAN:   BPPV (benign paroxysmal positional vertigo), unspecified laterality Sx have resolved.  No further WU at this time.  Chronic venous insufficiency Will treat rash with triamcinolone.  Educated that swelling was the culprit.  Prescribed compression stockings.  Also decrease amlodipine dose to 5 and keep an eye on BP  HYPERTENSION, BENIGN I worry that I may be over treating given lightheadedness.  Will decrease amlodipine to 5, which may help with both lightheadedness and leg swelling.  Watch home BP for rising systolics.     Moses Manners, MD [facility]

## 2019-10-15 NOTE — Assessment & Plan Note (Signed)
I worry that I may be over treating given lightheadedness.  Will decrease amlodipine to 5, which may help with both lightheadedness and leg swelling.  Watch home BP for rising systolics.

## 2020-01-18 DIAGNOSIS — H903 Sensorineural hearing loss, bilateral: Secondary | ICD-10-CM | POA: Diagnosis not present

## 2020-02-05 DIAGNOSIS — H903 Sensorineural hearing loss, bilateral: Secondary | ICD-10-CM | POA: Diagnosis not present

## 2020-03-14 ENCOUNTER — Other Ambulatory Visit: Payer: Self-pay

## 2020-03-14 DIAGNOSIS — I872 Venous insufficiency (chronic) (peripheral): Secondary | ICD-10-CM

## 2020-03-14 MED ORDER — AMLODIPINE BESYLATE 10 MG PO TABS: 5.0000 mg | ORAL_TABLET | Freq: Every day | ORAL | 0 refills | Status: DC

## 2020-03-14 MED ORDER — NITROGLYCERIN 0.4 MG SL SUBL: 0.4000 mg | SUBLINGUAL_TABLET | SUBLINGUAL | 0 refills | Status: DC | PRN

## 2020-03-14 MED ORDER — CLOPIDOGREL BISULFATE 75 MG PO TABS: 75.0000 mg | ORAL_TABLET | Freq: Every day | ORAL | 0 refills | Status: DC

## 2020-05-15 ENCOUNTER — Other Ambulatory Visit: Payer: Self-pay

## 2020-05-15 MED ORDER — CLOPIDOGREL BISULFATE 75 MG PO TABS: 75.0000 mg | ORAL_TABLET | Freq: Every day | ORAL | 0 refills | Status: DC

## 2020-05-15 MED ORDER — NITROGLYCERIN 0.4 MG SL SUBL: 0.4000 mg | SUBLINGUAL_TABLET | SUBLINGUAL | 0 refills | Status: DC | PRN

## 2020-07-23 ENCOUNTER — Other Ambulatory Visit: Payer: Self-pay

## 2020-07-23 MED ORDER — NITROGLYCERIN 0.4 MG SL SUBL: 0.4000 mg | SUBLINGUAL_TABLET | SUBLINGUAL | 0 refills | Status: DC | PRN

## 2020-07-23 MED ORDER — CLOPIDOGREL BISULFATE 75 MG PO TABS: 75.0000 mg | ORAL_TABLET | Freq: Every day | ORAL | 0 refills | Status: DC

## 2020-08-25 ENCOUNTER — Telehealth: Payer: PPO | Admitting: Cardiovascular Disease

## 2020-09-15 ENCOUNTER — Telehealth: Payer: Self-pay | Admitting: Cardiovascular Disease

## 2020-09-15 DIAGNOSIS — I872 Venous insufficiency (chronic) (peripheral): Secondary | ICD-10-CM

## 2020-09-15 MED ORDER — AMLODIPINE BESYLATE 10 MG PO TABS: 5.0000 mg | ORAL_TABLET | Freq: Every day | ORAL | 1 refills | Status: DC

## 2020-09-15 NOTE — Telephone Encounter (Signed)
*  STAT* If patient is at the pharmacy, call can be transferred to refill team.   1. Which medications need to be refilled? (please list name of each medication and dose if known)  amLODipine (NORVASC) 10 MG tablet  2. Which pharmacy/location (including street and city if local pharmacy) is medication to be sent to? [facility] [address] [place], [place] - [address]  3. Do they need a 30 day or 90 day supply? 30   Patient could not get an appointment until 11/05/20. Patient is worried the mail order will not arrive before the patient runs out of medication so the patient would like a refill sent to his local pharmacy

## 2020-09-15 NOTE — Telephone Encounter (Signed)
Pt's medication was sent to pt's pharmacy as requested. Confirmation received.  °

## 2020-09-25 ENCOUNTER — Other Ambulatory Visit: Payer: Self-pay

## 2020-09-25 MED ORDER — NITROGLYCERIN 0.4 MG SL SUBL: 0.4000 mg | SUBLINGUAL_TABLET | SUBLINGUAL | 0 refills | Status: DC | PRN

## 2020-09-25 MED ORDER — CLOPIDOGREL BISULFATE 75 MG PO TABS: 75.0000 mg | ORAL_TABLET | Freq: Every day | ORAL | 1 refills | Status: DC

## 2020-09-25 NOTE — Addendum Note (Signed)
Addended by: Margaret Pyle D on: 09/25/2020 08:51 AM   Modules accepted: Orders

## 2020-10-14 ENCOUNTER — Encounter: Payer: Self-pay | Admitting: Cardiovascular Disease

## 2020-10-14 ENCOUNTER — Encounter (INDEPENDENT_AMBULATORY_CARE_PROVIDER_SITE_OTHER): Payer: Self-pay

## 2020-10-14 ENCOUNTER — Other Ambulatory Visit: Payer: Self-pay

## 2020-10-14 ENCOUNTER — Ambulatory Visit: Payer: PPO | Admitting: Cardiovascular Disease

## 2020-10-14 VITALS — BP 118/60 | HR 64 | Ht 69.0 in | Wt 169.0 lb

## 2020-10-14 DIAGNOSIS — I340 Nonrheumatic mitral (valve) insufficiency: Secondary | ICD-10-CM | POA: Diagnosis not present

## 2020-10-14 DIAGNOSIS — I25118 Atherosclerotic heart disease of native coronary artery with other forms of angina pectoris: Secondary | ICD-10-CM

## 2020-10-14 DIAGNOSIS — E7800 Pure hypercholesterolemia, unspecified: Secondary | ICD-10-CM | POA: Diagnosis not present

## 2020-10-14 DIAGNOSIS — I10 Essential (primary) hypertension: Secondary | ICD-10-CM

## 2020-10-14 MED ORDER — AMLODIPINE BESYLATE 10 MG PO TABS: 10.0000 mg | ORAL_TABLET | Freq: Every day | ORAL | 3 refills | Status: DC

## 2020-10-14 MED ORDER — CLOPIDOGREL BISULFATE 75 MG PO TABS: 75.0000 mg | ORAL_TABLET | Freq: Every day | ORAL | 3 refills | Status: DC

## 2020-10-14 NOTE — Progress Notes (Signed)
Chief Complaint  Patient presents with  . Follow-up    CAD   History of Present Illness: 85 yo male with history of CAD s/p 4V CABG 2003 (LIMA to LAD, SVG to Diagonal, SVG to OM, SVG to PDA), HLD with statin intolerance, HTN and mild mitral regurgitation who is here today for cardiac follow up. He had been followed in the past by Dr. Riley Kill. He had an anterior MI in 1998 at which time a bare metal stent was placed in the LAD. Cardiac cath 2003 with severe stenoses in the Diagonal, OM2 and PDA. He underwent 4V CABG in 2003. He then had PCI of the mid RCA in 2008 (4.0 x 16 mm Liberte bare metal stent) and PCI of the OM1 with DES in 2011 (3.5 x 20 mm Promus Premier). I saw him in August 2014 and he had chest pain c/w unstable angina. Repeat cath 03/26/13: prox to mid LAD 60-70% (unchanged), Diagonal occluded, OM1 85-90% dist to stent, mid RCA 40%, mid RCA stent patent with 30% ISR, SVG to Diagonal patent, SVG to OM known to be occluded, SVG to PDA known to be occluded, LIMA to LAD known to be atretic, EF 55%. FFR of LAD: 0.90 (not flow limiting). 3.0 x32 mm Promus premier DES placed in the OM1 overlapping old stent. 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 Echo December 2016 with normal LV function and mild MR. Event monitor December 2016 with sinus brady, PACs, no pauses or high grade AV block.   He is here today for follow up. The patient denies any chest pain, dyspnea, palpitations, lower extremity edema, orthopnea, PND, dizziness, near syncope or syncope.   Primary Care Physician: Moses Manners, MD  Past Medical History:  Diagnosis Date  . CAD (coronary artery disease)    a. Ant MI 1998;  b. 2003: s/p CABG x 4 (VG->Diag, VG->OM, VG->PDA, LIMA->LAD);  c. PCI 2008: RCA - 4.0x16 Liberte BMS;  d. 07/2010 PCI: OM1- 3.5x20 Promus DES;  e. 03/2013 Cath/PCI: LM nl, LAD 60-70p/m (FFR 0.9->Med Rx), Diag 100, LCX patent prox stent, OM1 95-90(3.0x32 Promus  Premier DES), RCA dom, 54m, 30 ISR, VG->Diag ok, VG->OM KTBO, VG->PDA KTBO, LIMA->LAD known atresia, EF 55%  . Dyslipidemia   . Hypertension   . Statin intolerance     Past Surgical History:  Procedure Laterality Date  . ANKLE FRACTURE SURGERY Left ~ 1999   "broke in 1993; casted; PCP said not broken; had MI; walked for rehab; wore ankle out; put in plates and screws" (03/26/2013)  . bronchogenic cyst resection  08/06/1997  . CHOLECYSTECTOMY    . CORONARY ANGIOPLASTY WITH STENT PLACEMENT  1998-03/26/2013   "2 at one time in 1998; this one  today is the 3rd one I've had since [facility] in 2003" (03/26/2013)  . CORONARY ANGIOPLASTY WITH STENT PLACEMENT  10/05/2006   4.3mmx 31mm Liberte non DES  . CORONARY ARTERY BYPASS GRAFT  2003   x4 LIMA to LAD SVG digonal, OM, PDA  . INGUINAL HERNIA REPAIR Right    "3 times" (03/26/2013)  . LAPAROSCOPIC INCISIONAL / UMBILICAL / VENTRAL HERNIA REPAIR  2004  . LEFT HEART CATHETERIZATION WITH CORONARY/GRAFT ANGIOGRAM N/A 03/26/2013   Procedure: LEFT HEART CATHETERIZATION WITH Isabel Caprice;  Surgeon: Kathleene Hazel, MD;  Location: [facility] CATH LAB;  Service: Cardiovascular;  Laterality: N/A;    Current Outpatient Medications  Medication Sig Dispense Refill  . fluticasone (FLONASE) 50 MCG/ACT nasal  spray Place 2 sprays into both nostrils daily. 16 g 6  . meclizine (ANTIVERT) 25 MG tablet Take 1 tablet (25 mg total) by mouth 3 (three) times daily as needed for dizziness. 20 tablet 0  . nitroGLYCERIN (NITROSTAT) 0.4 MG SL tablet Place 1 tablet (0.4 mg total) under the tongue every 5 (five) minutes as needed for chest pain. Please keep upcoming appt in March 2022. 25 tablet 0  . triamcinolone ointment (KENALOG) 0.5 % Apply 1 application topically 2 (two) times daily. To rash on feet and ankle 30 g 6  . amLODipine (NORVASC) 10 MG tablet Take 1 tablet (10 mg total) by mouth daily. 90 tablet 3  . clopidogrel (PLAVIX) 75 MG tablet Take 1 tablet (75 mg total)  by mouth daily. 90 tablet 3   No current facility-administered medications for this visit.    No Known Allergies  Social History   Socioeconomic History  . Marital status: Married    Spouse name: Not on file  . Number of children: Not on file  . Years of education: Not on file  . Highest education level: Not on file  Occupational History  . Occupation: Retired, but works on cars part-time  Tobacco Use  . Smoking status: Never Smoker  . Smokeless tobacco: Never Used  Vaping Use  . Vaping Use: Never used  Substance and Sexual Activity  . Alcohol use: Yes    Comment: 03/26/2013 "couple shots some weeks; none for 2 months; just take a drink q now and then"  . Drug use: No  . Sexual activity: Yes  Other Topics Concern  . 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 [phone]   Lives in [place] with wife. 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   Social Determinants of Health   Financial Resource Strain: Not on file  Food Insecurity: Not on file  Transportation Needs: Not on file  Physical Activity: Not on file  Stress: Not on file  Social Connections: Not on file  Intimate Partner Violence: Not on file    Family History  Problem Relation Age of Onset  . Hypertension Other   . Heart disease Other   . Kidney cancer Other     Review of Systems:  As stated in the HPI and otherwise negative.   BP 118/60   Pulse 64   Ht 5\' 9"  (1.753 m)   Wt 169 lb (76.7 kg)   SpO2 96%   BMI 24.96 kg/m   Physical Examination:  General: Well developed, well nourished, NAD  HEENT: OP clear, mucus membranes moist  SKIN: warm, dry. No rashes. Neuro: No focal deficits  Musculoskeletal: Muscle strength  5/5 all ext  Psychiatric: Mood and affect normal  Neck: No JVD, no carotid bruits, no thyromegaly, no lymphadenopathy.  Lungs:Clear bilaterally, no wheezes, rhonci, crackles Cardiovascular: Regular rate and rhythm. No murmurs, gallops or rubs. Abdomen:Soft. Bowel sounds present. Non-tender.  Extremities: No lower extremity edema. Pulses are 2 + in the bilateral DP/PT.  Echo December 2016: Left ventricle: The cavity size was normal. Wall thickness was normal. Systolic function was normal. The estimated ejection fraction was in the range of 55% to  60%. Wall motion was normal; there were no regional wall motion abnormalities. Features are consistent with a pseudonormal left ventricular filling pattern, with concomitant abnormal relaxation and increased filling pressure (grade 2 diastolic dysfunction). - Aortic valve: Trileaflet; mildly thickened, mildly calcified leaflets. - Mitral valve: There was mild regurgitation. - Left atrium: The atrium was mildly dilated. - Right atrium: The atrium was mildly dilated.  Cardiac cath 03/26/13: Hemodynamic Findings:  Central aortic pressure: 118/59  Left ventricular pressure: 121/8/12  Angiographic Findings:  Left main: No obstructive disease.  Left Anterior Descending Artery: Moderate to large caliber vessel that courses to the apex. The proximal to mid vessel stented segment with 60-70% restenosis, grossly unchanged from last cath in 2011. The distal vessel has diffuse mild stenosis. The diagonal branch is occluded but fills from the patent vein graft.  Circumflex Artery: Large caliber vessel with patent stent in proximal vessel extending into OM1. The first OM branch has a hazy, 85-90% stenosis distal to the stent. The AV groove Circumflex is small in caliber and has mild plaque disease.  Right Coronary Artery: Large dominant vessel with 40% mid stenosis, patent mid stent with 30% restenosis, mild distal disease.  Graft Anatomy:  SVG  to Diagonal is patent  SVG to OM known to be occluded and not injected  SVG to PDA known to be occluded and not injected  LIMA to mid LAD is known to be atretic and not injected  Left Ventricular Angiogram: LVEF=55%. 1+ MR  Impression:  1. Triple vessel CAD s/p CABG with 1/4 patent grafts, patent stents LAD and RCA.  2. Unstable angina (class III) secondary to severe stenosis OM1  3. Successful PTCA/DES x 1 OM1  4. Moderate disease stented segment of proximal LAD, FFR of 0.90 suggesting this lesion is not flow limiting.  5. Preserved LV systolic function  EKG:  EKG is not ordered today. The ekg ordered today demonstrates   Recent Labs: No results found for requested labs within last 8760 hours.   Lipid Panel Lipid Panel     Component Value Date/Time   CHOL 204 (H) 12/13/2018 1503   TRIG 99 12/13/2018 1503   HDL 53 12/13/2018 1503   CHOLHDL 3.8 12/13/2018 1503   CHOLHDL 5 09/26/2012 0907   VLDL 23.6 09/26/2012 0907   LDLCALC 131 (H) 12/13/2018 1503     Wt Readings from Last 3 Encounters:  10/14/20 169 lb (76.7 kg)  10/15/19 166 lb 3.2 oz (75.4 kg)  09/26/19 166 lb 12.8 oz (75.7 kg)     Other studies Reviewed: Additional studies/ records that were reviewed today include: . Review of the above records demonstrates:    Assessment and Plan:   1. CAD with stable angina: No chest pain. He is known to have diffuse 3 vessel CAD s/p CABG and PCI. Last PCI was in 2014. One of 4 bypass grafts patent at time of last cath. He does not tolerate beta blockers due to bradycardia.  He does not tolerate statins.  Will continue Plavix.    2. HTN: BP is well controlled. No changes  3. HLD: He is intolerant of statins. We have discussed Repatha or Praluent in the past. He is now willing to try one. Will refer to Lipid Clinic to discuss.   4. Mitral regurgitation: Mild by echo in 2016. Will repeat echo now.   Current medicines are reviewed at length with the patient today.  The patient  does not have concerns regarding medicines.  The following changes  have been made:  no change  Labs/ tests ordered today include:   Orders Placed This Encounter  Procedures  . ECHOCARDIOGRAM COMPLETE    Disposition:   FU with me in 12 months  Signed, Verne Carrowhristopher McAlhany, MD 10/14/2020 2:36 PM    [facility] [address], [place], [place]  [address] Phone: [phone]; Fax: [phone]

## 2020-10-14 NOTE — Patient Instructions (Signed)
Medication Instructions:  Your physician recommends that you continue on your current medications as directed. Please refer to the Current Medication list given to you today.  *If you need a refill on your cardiac medications before your next appointment, please call your pharmacy*   Lab Work: none   Testing/Procedures: Your physician has requested that you have an echocardiogram. Echocardiography is a painless test that uses sound waves to create images of your heart. It provides your doctor with information about the size and shape of your heart and how well your heart's chambers and valves are working. This procedure takes approximately one hour. There are no restrictions for this procedure.   Follow-Up: At [facility], you and your health needs are our priority.  As part of our continuing mission to provide you with exceptional heart care, we have created designated Provider Care Teams.  These Care Teams include your primary Cardiologist (physician) and Advanced Practice Providers (APPs -  Physician Assistants and Nurse Practitioners) who all work together to provide you with the care you need, when you need it.  We recommend signing up for the patient portal called "MyChart".  Sign up information is provided on this After Visit Summary.  MyChart is used to connect with patients for Virtual Visits (Telemedicine).  Patients are able to view lab/test results, encounter notes, upcoming appointments, etc.  Non-urgent messages can be sent to your provider as well.   To learn more about what you can do with MyChart, go to [email or web address].    Your next appointment:   12 month(s)  The format for your next appointment:   In Person  Provider:   You may see Verne Carrow, MD or one of the following Advanced Practice Providers on your designated Care Team:    Ronie Spies, PA-C  Jacolyn Reedy, PA-C   Other Instructions You have been referred to Lipid Clinic for  hyperlipidemia management

## 2020-11-05 ENCOUNTER — Ambulatory Visit: Payer: PPO | Admitting: Physician Assistant

## 2020-11-05 NOTE — Progress Notes (Signed)
Patient ID: [name]                 DOB: 1936/04/02                    MRN: [id number]     HPI: Steve Gordon is a 85 y.o. male patient referred to lipid clinic by Dr. Clifton Gordon. PMH is significant for CAD s/p 4V CABG 2003 (LIMA to LAD, SVG to Diagonal, SVG to OM, SVG to PDA), HLD with statin intolerance, HTN and mild mitral regurgitation, CKD. He had an anterior MI in 1998 at which time a bare metal stent was placed in the LAD. Cardiac cath 2003 with severe stenoses in the Diagonal, OM2 and PDA, underwent 4V CABG in 2003. He then had PCI of the mid RCA in 2008 (4.0 x 16 mm Liberte bare metal stent) and PCI of the OM1 with DES in 2011 (3.5 x 20 mm Promus Premier). I saw him in August 2014 and he had chest pain c/w unstable angina. Repeat cath 03/26/13: prox to mid LAD 60-70% (unchanged), Diagonal occluded, OM1 85-90% dist to stent, mid RCA 40%, mid RCA stent patent with 30% ISR, SVG to Diagonal patent, SVG to OM known to be occluded, SVG to PDA known to be occluded, LIMA to LAD known to be atretic, EF 55%. FFR of LAD: 0.90 (not flow limiting). 3.0 x32 mm Promus premier DES placed in the OM1 overlapping old stent. 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 Echo December 2016 with normal LV function and mild MR. Event monitor December 2016 with sinus brady, PACs, no pauses or high grade AV block.   Last visit with Dr. Clifton Gordon 3/8, patient noted to be statin intolerant. Had discussed PCSK9 therapy in the past but he is now willing to try one.   Today, patient arrives accompanied by his wife Steve Gordon. Patient reports that he is not interested in starting Repatha, he did not sleep last night in anticipation of this visit because he does not want to start this medication. He states that given his current age he does not want to pursue lipid lowering therapy at this time. His wife reports that they don't remember the specific names or doses of medications tried in  the past but that he has tried many statins and he experiences muscle pain with all of them which resolved upon discontinuation. Thinks he has tried atorvastatin, simvastatin, rosuvastatin, and ezetimibe, but his wife is not sure if these names are just familiar from hearing commercials about them many years ago. He is very active around his land and working on cars.   Current Medications: No lipid lowering medications Intolerances: atorvastatin, rosuvastatin, simvastatin, ezetimibe (unknown doses), muscle aches all over especially legs, resolved quickly once stopped it Risk Factors: Multiple ASCVD events, HTN, HLD LDL goal: <55 mg/dL  Diet:  Breakfast: coffee, may have a couple of ginger snaps Lunch: roast beef or banana sandwich Dinner: meat, couple vegetables (does not fry, only cooks in [employer], grill, air fryer)  [place]5 [place] at [place], does lots of yard work, fixes up cars  Social History: Never smoker  Labs: 12/13/18: TC 204, TG 99, HDL 53, LDL 131 (no lipid lowering therapy)  Past Medical History:  Diagnosis Date  . CAD (coronary artery disease)    a. Ant MI 1998;  b. 2003: s/p CABG x 4 (VG->Diag, VG->OM, VG->PDA, LIMA->LAD);  c.  PCI 2008: RCA - 4.0x16 Liberte BMS;  d. 07/2010 PCI: OM1- 3.5x20 Promus DES;  e. 03/2013 Cath/PCI: LM nl, LAD 60-70p/m (FFR 0.9->Med Rx), Diag 100, LCX patent prox stent, OM1 95-90(3.0x32 Promus Premier DES), RCA dom, 44m, 30 ISR, VG->Diag ok, VG->OM KTBO, VG->PDA KTBO, LIMA->LAD known atresia, EF 55%  . Dyslipidemia   . Hypertension   . Statin intolerance     Current Outpatient Medications on File Prior to Visit  Medication Sig Dispense Refill  . amLODipine (NORVASC) 10 MG tablet Take 1 tablet (10 mg total) by mouth daily. 90 tablet 3  . clopidogrel (PLAVIX) 75 MG tablet Take 1 tablet (75 mg total) by mouth daily. 90 tablet 3  . fluticasone (FLONASE) 50 MCG/ACT nasal spray Place 2 sprays into both nostrils daily. 16 g 6  .  meclizine (ANTIVERT) 25 MG tablet Take 1 tablet (25 mg total) by mouth 3 (three) times daily as needed for dizziness. 20 tablet 0  . nitroGLYCERIN (NITROSTAT) 0.4 MG SL tablet Place 1 tablet (0.4 mg total) under the tongue every 5 (five) minutes as needed for chest pain. Please keep upcoming appt in March 2022. 25 tablet 0  . triamcinolone ointment (KENALOG) 0.5 % Apply 1 application topically 2 (two) times daily. To rash on feet and ankle 30 g 6   No current facility-administered medications on file prior to visit.    No Known Allergies  Assessment/Plan:  1. Hyperlipidemia - LDL is elevated above goal <55 mg/dL, most recently 656 mg/dL in 8127. Will recheck a lipid panel today (and direct LDL since patient is not fasting--ate a piece of cheesecake this morning around 7am). He is intolerant to multiple statins and ezetimibe. Patient does not want to start any lipid lowering therapies due to his age. Discussed Repatha which is the preferred PCSK9 on his insurance, requires prior auth, would be a $45 copay for 30 day supply but he would qualify for [employer]. Reviewed LDL lowering of 60% and instructions for how to inject it. He is not interested in this. Also discussed whether he would be willing to re-try rosuvastatin at a low dose (either 5 mg daily or could do 5 mg three times per week) to see if he is able to tolerate this since it tends to have less myalgias due to being more hydrophilic than other statins. It does not sound like he is interested in this either but they will think about this and let us know when we call tomorrow with results of lipid panel whether he would like to try any medications. Provided them with phone number of pharmacy clinic and to let us know if down the road they change their mind.   Pervis Hocking, PharmD PGY1 Pharmacy Resident 11/06/2020 11:29 AM

## 2020-11-06 ENCOUNTER — Ambulatory Visit (INDEPENDENT_AMBULATORY_CARE_PROVIDER_SITE_OTHER): Payer: PPO | Admitting: Student-PharmD

## 2020-11-06 ENCOUNTER — Ambulatory Visit (HOSPITAL_COMMUNITY): Payer: PPO | Attending: Cardiovascular Disease

## 2020-11-06 ENCOUNTER — Other Ambulatory Visit: Payer: Self-pay

## 2020-11-06 DIAGNOSIS — G720 Drug-induced myopathy: Secondary | ICD-10-CM | POA: Diagnosis not present

## 2020-11-06 DIAGNOSIS — E7800 Pure hypercholesterolemia, unspecified: Secondary | ICD-10-CM | POA: Diagnosis not present

## 2020-11-06 DIAGNOSIS — T466X5A Adverse effect of antihyperlipidemic and antiarteriosclerotic drugs, initial encounter: Secondary | ICD-10-CM

## 2020-11-06 DIAGNOSIS — I340 Nonrheumatic mitral (valve) insufficiency: Secondary | ICD-10-CM | POA: Diagnosis not present

## 2020-11-06 LAB — ECHOCARDIOGRAM COMPLETE
Area-P 1/2: 2.73 cm2
P 1/2 time: 597 msec
S' Lateral: 2.8 cm

## 2020-11-06 LAB — LIPID PANEL
Chol/HDL Ratio: 4.4 ratio (ref 0.0–5.0)
Cholesterol, Total: 218 mg/dL — ABNORMAL HIGH (ref 100–199)
HDL: 49 mg/dL (ref >39)
LDL Chol Calc (NIH): 152 mg/dL — ABNORMAL HIGH (ref 0–99)
Triglycerides: 97 mg/dL (ref 0–149)
VLDL Cholesterol Cal: 17 mg/dL (ref 5–40)

## 2020-11-06 LAB — LDL CHOLESTEROL, DIRECT: LDL Direct: 142 mg/dL — ABNORMAL HIGH (ref 0–99)

## 2020-11-06 NOTE — Patient Instructions (Signed)
Nice to see you today!  Keep up the good work with diet and exercise. Aim for a diet full of vegetables, fruit and lean meats (chicken, [place], fish). Try to limit carbs (bread, pasta, sugar, rice) and red meat consumption.  Your goal LDL is less than 55 mg/dL, you're currently at 828 mg/dL  We are checking your cholesterol today and will call you tomorrow with the results.   Options we discussed today: -Repatha: this is 1 injection into the abdomen every 2 weeks -We could try a low dose of rosuvastatin 5 mg to see if he experiences muscle pains with this  Please give [place] a call at [phone] with any questions or concerns.

## 2020-11-07 ENCOUNTER — Telehealth: Payer: Self-pay | Admitting: Pharmacist

## 2020-11-07 NOTE — Telephone Encounter (Signed)
I called and spoke with patient and his wife. Reviewed LDL of 142. Asked pt if he would like to try low dose rosuvastatin, Repatha or nothing as he wished to do yesterday. Patient stated he would like to think about it. Gave phone number for him to call back.

## 2020-11-07 NOTE — Progress Notes (Signed)
Pt has been made aware of normal result and verbalized understanding.  jw

## 2020-11-28 ENCOUNTER — Other Ambulatory Visit: Payer: Self-pay

## 2020-11-28 MED ORDER — NITROGLYCERIN 0.4 MG SL SUBL: 0.4000 mg | SUBLINGUAL_TABLET | SUBLINGUAL | 7 refills | Status: DC | PRN

## 2021-01-30 ENCOUNTER — Emergency Department (HOSPITAL_COMMUNITY): Payer: PPO

## 2021-01-30 ENCOUNTER — Other Ambulatory Visit: Payer: Self-pay

## 2021-01-30 ENCOUNTER — Inpatient Hospital Stay (HOSPITAL_COMMUNITY): Payer: PPO

## 2021-01-30 ENCOUNTER — Ambulatory Visit (HOSPITAL_COMMUNITY)
Admission: EM | Admit: 2021-01-30 | Discharge: 2021-01-30 | Disposition: A | Discharge status: Home / Self Care | Payer: PPO | Attending: Medical Oncology | Admitting: Medical Oncology

## 2021-01-30 ENCOUNTER — Encounter (HOSPITAL_COMMUNITY): Payer: Self-pay | Admitting: Medical Oncology

## 2021-01-30 ENCOUNTER — Observation Stay (HOSPITAL_COMMUNITY)
Admission: EM | Admit: 2021-01-30 | Discharge: 2021-01-31 | Disposition: A | Discharge status: Home / Self Care | Payer: PPO | Attending: Family Medicine | Admitting: Family Medicine

## 2021-01-30 DIAGNOSIS — I129 Hypertensive chronic kidney disease with stage 1 through stage 4 chronic kidney disease, or unspecified chronic kidney disease: Secondary | ICD-10-CM | POA: Diagnosis not present

## 2021-01-30 DIAGNOSIS — J320 Chronic maxillary sinusitis: Secondary | ICD-10-CM | POA: Diagnosis not present

## 2021-01-30 DIAGNOSIS — Z79899 Other long term (current) drug therapy: Secondary | ICD-10-CM | POA: Diagnosis not present

## 2021-01-30 DIAGNOSIS — G319 Degenerative disease of nervous system, unspecified: Secondary | ICD-10-CM | POA: Diagnosis not present

## 2021-01-30 DIAGNOSIS — I4891 Unspecified atrial fibrillation: Secondary | ICD-10-CM | POA: Diagnosis not present

## 2021-01-30 DIAGNOSIS — E785 Hyperlipidemia, unspecified: Secondary | ICD-10-CM | POA: Diagnosis not present

## 2021-01-30 DIAGNOSIS — I2510 Atherosclerotic heart disease of native coronary artery without angina pectoris: Secondary | ICD-10-CM | POA: Insufficient documentation

## 2021-01-30 DIAGNOSIS — Z20822 Contact with and (suspected) exposure to covid-19: Secondary | ICD-10-CM | POA: Insufficient documentation

## 2021-01-30 DIAGNOSIS — M25473 Effusion, unspecified ankle: Secondary | ICD-10-CM | POA: Diagnosis not present

## 2021-01-30 DIAGNOSIS — R42 Dizziness and giddiness: Secondary | ICD-10-CM | POA: Diagnosis not present

## 2021-01-30 DIAGNOSIS — N183 Chronic kidney disease, stage 3 unspecified: Secondary | ICD-10-CM | POA: Insufficient documentation

## 2021-01-30 DIAGNOSIS — I482 Chronic atrial fibrillation, unspecified: Secondary | ICD-10-CM

## 2021-01-30 DIAGNOSIS — G459 Transient cerebral ischemic attack, unspecified: Secondary | ICD-10-CM | POA: Diagnosis not present

## 2021-01-30 DIAGNOSIS — J321 Chronic frontal sinusitis: Secondary | ICD-10-CM | POA: Diagnosis not present

## 2021-01-30 DIAGNOSIS — Z951 Presence of aortocoronary bypass graft: Secondary | ICD-10-CM | POA: Insufficient documentation

## 2021-01-30 DIAGNOSIS — I10 Essential (primary) hypertension: Secondary | ICD-10-CM | POA: Diagnosis not present

## 2021-01-30 DIAGNOSIS — I6782 Cerebral ischemia: Secondary | ICD-10-CM | POA: Diagnosis not present

## 2021-01-30 LAB — CBC
HCT: 44.5 % (ref 39.0–52.0)
Hemoglobin: 14.3 g/dL (ref 13.0–17.0)
MCH: 31.6 pg (ref 26.0–34.0)
MCHC: 32.1 g/dL (ref 30.0–36.0)
MCV: 98.5 fL (ref 80.0–100.0)
Platelets: 210 10*3/uL (ref 150–400)
RBC: 4.52 MIL/uL (ref 4.22–5.81)
RDW: 13.5 % (ref 11.5–15.5)
WBC: 5.9 10*3/uL (ref 4.0–10.5)
nRBC: 0 % (ref 0.0–0.2)

## 2021-01-30 LAB — BASIC METABOLIC PANEL
Anion gap: 11 (ref 5–15)
BUN: 18 mg/dL (ref 8–23)
CO2: 20 mmol/L — ABNORMAL LOW (ref 22–32)
Calcium: 8.7 mg/dL — ABNORMAL LOW (ref 8.9–10.3)
Chloride: 108 mmol/L (ref 98–111)
Creatinine, Ser: 1.53 mg/dL — ABNORMAL HIGH (ref 0.61–1.24)
GFR, Estimated: 45 mL/min — ABNORMAL LOW (ref >60)
Glucose, Bld: 97 mg/dL (ref 70–99)
Potassium: 4 mmol/L (ref 3.5–5.1)
Sodium: 139 mmol/L (ref 135–145)

## 2021-01-30 LAB — RAPID URINE DRUG SCREEN, HOSP PERFORMED
Amphetamines: NOT DETECTED
Barbiturates: NOT DETECTED
Benzodiazepines: NOT DETECTED
Cocaine: NOT DETECTED
Opiates: NOT DETECTED
Tetrahydrocannabinol: NOT DETECTED

## 2021-01-30 LAB — SARS CORONAVIRUS 2 (TAT 6-24 HRS): SARS Coronavirus 2: NEGATIVE

## 2021-01-30 LAB — TROPONIN I (HIGH SENSITIVITY)
Troponin I (High Sensitivity): 7 ng/L (ref <18)
Troponin I (High Sensitivity): 8 ng/L (ref <18)

## 2021-01-30 MED ORDER — ENOXAPARIN SODIUM 40 MG/0.4ML IJ SOSY
40.0000 mg | PREFILLED_SYRINGE | INTRAMUSCULAR | Status: DC
  Administered 2021-01-30: 40 mg via SUBCUTANEOUS
  Filled 2021-01-30: qty 0.4

## 2021-01-30 MED ORDER — ASPIRIN EC 81 MG PO TBEC
81.0000 mg | DELAYED_RELEASE_TABLET | Freq: Every day | ORAL | Status: DC
  Administered 2021-01-30 – 2021-01-31 (×2): 81 mg via ORAL
  Filled 2021-01-30 (×2): qty 1

## 2021-01-30 MED ORDER — ACETAMINOPHEN 325 MG PO TABS: 650.0000 mg | ORAL_TABLET | ORAL | Status: DC | PRN

## 2021-01-30 MED ORDER — METOPROLOL TARTRATE 12.5 MG HALF TABLET
12.5000 mg | ORAL_TABLET | Freq: Two times a day (BID) | ORAL | Status: DC
  Administered 2021-01-30 – 2021-01-31 (×2): 12.5 mg via ORAL
  Filled 2021-01-30 (×2): qty 1

## 2021-01-30 MED ORDER — CLOPIDOGREL BISULFATE 75 MG PO TABS
75.0000 mg | ORAL_TABLET | Freq: Every day | ORAL | Status: DC
  Administered 2021-01-30 – 2021-01-31 (×2): 75 mg via ORAL
  Filled 2021-01-30 (×2): qty 1

## 2021-01-30 MED ORDER — STROKE: EARLY STAGES OF RECOVERY BOOK
Freq: Once | Status: DC
  Filled 2021-01-30: qty 1

## 2021-01-30 MED ORDER — CLOPIDOGREL BISULFATE 75 MG PO TABS: 75.0000 mg | ORAL_TABLET | Freq: Every day | ORAL | Status: DC

## 2021-01-30 NOTE — ED Provider Notes (Signed)
Emergency Medicine Provider Triage Evaluation Note  Steve Gordon , a 85 y.o. male  was evaluated in triage.  Pt complains of dizziness and feeling as though he was "on cloud 9" with difficulty walking and possibly blurred vision that began when he woke up at 6 AM this morning.  He states the symptoms lasted approximately 30 minutes duration.  He went to urgent care who found him to be in new onset atrial fibrillation and sent here to the ED for ongoing evaluation and management.  He denied any associated chest pain, palpitations, or shortness of breath during his 30-minute episode of "feeling weird".  Patient felt perfectly fine and at baseline last evening prior to going to bed.  No symptoms at present.  Walking without difficulty.  Denies any continued lightheadedness/dizziness.  Review of Systems  Positive: Dizziness earlier upon waking. No symptoms at present.  Negative: Chest pain, SOB, palpitations, continued neurologic deficits.  Physical Exam  BP (!) 146/91   Pulse (!) 102   Temp 98.4 F (36.9 C) (Oral)   Resp 14   SpO2 100%  Gen:   Awake, no distress   Resp:  Normal effort  MSK:   Moves extremities without difficulty  Other:  Right leg more swollen (he says chronic)  Medical Decision Making  Medically screening exam initiated at 12:18 PM.  Appropriate orders placed.  HOLGER SOKOLOWSKI was informed that the remainder of the evaluation will be completed by another provider, this initial triage assessment does not replace that evaluation, and the importance of remaining in the ED until their evaluation is complete.    Lorelee New, PA-C 01/30/21 1225    Tilden Fossa, MD 01/30/21 1433

## 2021-01-30 NOTE — ED Notes (Signed)
Patient is being discharged from the Urgent Care and sent to the Emergency Department via pov . Per Clent Jacks, PA , patient is in need of higher level of care due to dizziness, New onset Atrial fibrillation. Patient is aware and verbalizes understanding of plan of care.  Vitals:   01/30/21 1049 01/30/21 1102  BP: (!) 144/86   Pulse: 100   Resp:  16  Temp: 98.2 F (36.8 C)   SpO2: 97%

## 2021-01-30 NOTE — ED Triage Notes (Signed)
Pt in with c/o dizziness that started this morning around 4 am  Pt states he felt like he was going to pass out   Denies any cp, headache, n/v, or dizziness at the moment   Pt states he took nitro and felt better afterwards

## 2021-01-30 NOTE — Consult Note (Addendum)
Cardiology Consultation:   Patient ID: Steve Gordon MRN: [id number]; DOB: Jul 31, 1936  Admit date: 01/30/2021 Date of Consult: 01/30/2021  PCP:  Moses Manners, MD   [facility] Providers Cardiologist:  Verne Carrow, MD        Patient Profile:   Steve Gordon is a 85 y.o. male with a hx of CAD, status post CABG and multiple PCI who is being seen 01/30/2021 for the evaluation of AF at the request of Dr.Khalinqdina.  History of Present Illness:   Steve Gordon is an 85 year old gentleman who is followed by Dr. Clifton James for his coronary artery disease.  He has known artery disease and is status post four-vessel CABG revascularization surgery in 2003 with a LIMA to his LAD, SVG to diagonal, SVG to OM, and SVG to PDA.  He has gone PCI to the mid RCA in 2008 with insertion of a 4.0 x 16 mm bare-metal stent and PCI of his OM1 vessel with a DES stent in 2011.  His last catheterization was in 2014 time his LIMA to LAD was atretic, SVG to OM and SVG to PDA were occluded.  He underwent stenting in the OM1 vessel at that time.  An event monitor in 2016 showed sinus bradycardia, PACs without pauses or high-grade AV block.  Last saw Dr. Clifton James on October 14, 2020 and was stable from a cardiac standpoint.  He has a history of statin intolerance in the past discussion regarding PCSK9 inhibition with Repatha or Praluent taken place.  He had been on amlodipine 10 mg for hypertension and was on chronic Plavix therapy 75 mg daily.  The patient presented to urgent care this morning after having a 30-minute episode of unsteady gait, dizziness and lightheadedness.  He was found to be in atrial fibrillation and  was sent to [facility] ER.  He is unaware of any prior history of atrial fibrillation or prior neurologic event with stroke or seizure.  He was felt to have a TIA or posterior circulation stroke and is undergoing neurologic imaging.  ECG today reveals atrial fibrillation at 88 bpm with incomplete right  bundle branch block.  He denies any chest pain.   Past Medical History:  Diagnosis Date   CAD (coronary artery disease)    a. Ant MI 1998;  b. 2003: s/p CABG x 4 (VG->Diag, VG->OM, VG->PDA, LIMA->LAD);  c. PCI 2008: RCA - 4.0x16 Liberte BMS;  d. 07/2010 PCI: OM1- 3.5x20 Promus DES;  e. 03/2013 Cath/PCI: LM nl, LAD 60-70p/m (FFR 0.9->Med Rx), Diag 100, LCX patent prox stent, OM1 95-90(3.0x32 Promus Premier DES), RCA dom, 99m, 30 ISR, VG->Diag ok, VG->OM KTBO, VG->PDA KTBO, LIMA->LAD known atresia, EF 55%   Dyslipidemia    Hypertension    Statin intolerance     Past Surgical History:  Procedure Laterality Date   ANKLE FRACTURE SURGERY Left ~ 1999   "broke in 1993; casted; PCP said not broken; had MI; walked for rehab; wore ankle out; put in plates and screws" (03/26/2013)   bronchogenic cyst resection  08/06/1997   CHOLECYSTECTOMY     CORONARY ANGIOPLASTY WITH STENT PLACEMENT  1998-03/26/2013   "2 at one time in 1998; this one  today is the 3rd one I've had since [facility] in 2003" (03/26/2013)   CORONARY ANGIOPLASTY WITH STENT PLACEMENT  10/05/2006   4.18mmx 4mm Liberte non DES   CORONARY ARTERY BYPASS GRAFT  2003   x4 LIMA to LAD SVG digonal, OM, PDA   INGUINAL HERNIA REPAIR Right    "  3 times" (03/26/2013)   LAPAROSCOPIC INCISIONAL / UMBILICAL / VENTRAL HERNIA REPAIR  2004   LEFT HEART CATHETERIZATION WITH CORONARY/GRAFT ANGIOGRAM N/A 03/26/2013   Procedure: LEFT HEART CATHETERIZATION WITH Isabel Caprice;  Surgeon: Kathleene Hazel, MD;  Location: [facility] CATH LAB;  Service: Cardiovascular;  Laterality: N/A;     Home Medications:  Prior to Admission medications   Medication Sig Start Date End Date Taking? Authorizing Provider  amLODipine (NORVASC) 10 MG tablet Take 1 tablet (10 mg total) by mouth daily. 10/14/20  Yes Dyann Kief, PA-C  clopidogrel (PLAVIX) 75 MG tablet Take 1 tablet (75 mg total) by mouth daily. 10/14/20  Yes Dyann Kief, PA-C  nitroGLYCERIN (NITROSTAT) 0.4 MG  SL tablet Place 1 tablet (0.4 mg total) under the tongue every 5 (five) minutes as needed for chest pain. 11/28/20  Yes Kathleene Hazel, MD  fluticasone (FLONASE) 50 MCG/ACT nasal spray Place 2 sprays into both nostrils daily. Patient not taking: Reported on 01/30/2021 11/29/17   Almon Hercules, MD  meclizine (ANTIVERT) 25 MG tablet Take 1 tablet (25 mg total) by mouth 3 (three) times daily as needed for dizziness. Patient not taking: Reported on 01/30/2021 09/26/19   Derrel Nip, MD  triamcinolone ointment (KENALOG) 0.5 % Apply 1 application topically 2 (two) times daily. To rash on feet and ankle Patient not taking: Reported on 01/30/2021 10/15/19   Moses Manners, MD    Inpatient Medications: Scheduled Meds:   stroke: mapping our early stages of recovery book   Does not apply Once   Continuous Infusions:  PRN Meds: acetaminophen  Allergies:   No Known Allergies  Social History:   Social History   Socioeconomic History   Marital status: Married    Spouse name: Not on file   Number of children: Not on file   Years of education: Not on file   Highest education level: Not on file  Occupational History   Occupation: Retired, but works on cars part-time  Tobacco Use   Smoking status: Never   Smokeless tobacco: Never  Vaping Use   Vaping Use: Never used  Substance and Sexual Activity   Alcohol use: Yes    Comment: 03/26/2013 "couple shots some weeks; none for 2 months; just take a drink q now and then"   Drug use: No   Sexual activity: Yes  Other Topics Concern   Not on file  Social History Narrative   Level of education: 10th grade   Employment: retired, 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 [phone]   Lives in [place] with wife. 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    Social Determinants of Health   Financial Resource Strain: Not on file  Food Insecurity: Not on file  Transportation Needs: Not on file  Physical Activity: Not on file  Stress: Not on file  Social Connections: Not on file  Intimate Partner Violence: Not on file    Family History:    Family History  Problem Relation Age of Onset   Hypertension Other    Heart disease Other    Kidney cancer Other      ROS:  Please see the history of present illness.  Negative for headache, blurry vision, dysarthria, aphasia, facial droop, weakness No wheezing  No chest pain. No bleeding No abdominal pain No significant musculoskeletal symptoms Positive leg swelling right greater than left All other ROS reviewed and negative.     Physical Exam/Data:   Vitals:   01/30/21 1715 01/30/21 1730 01/30/21 1800 01/30/21 1815  BP: (!) 162/90 (!) 152/94 (!) 171/160 (!) 161/85  Pulse: 92 90 (!) 131 83  Resp: 12 18 (!) 21 20  Temp:      TempSrc:      SpO2: 96% 99% 97% 97%   No intake or output data in the 24 hours ending 01/30/21 1841 Last 3 Weights 10/14/2020 10/15/2019 09/26/2019  Weight (lbs) 169 lb 166 lb 3.2 oz 166 lb 12.8 oz  Weight (kg) 76.658 kg 75.388 kg 75.66 kg     There is no height or weight on file to calculate BMI.  General:  Well nourished, well developed, in no acute distress HEENT: normal Lymph: no adenopathy Neck: no JVD Endocrine:  No thryomegaly Vascular: No carotid bruits; FA pulses 2+ bilaterally without bruits  Cardiac:  normal S1, S2; irregularly irregular with ventricular rate in the 80s Lungs:  clear to auscultation bilaterally, no wheezing, rhonchi or rales  Abd: soft, nontender, no hepatomegaly  Ext: 1+ left ankle edema, 1-2+ right ankle edema Musculoskeletal:  No deformities, BUE and BLE strength normal and equal Skin: warm and dry  Neuro:  CNs 2-12 intact, no focal abnormalities noted Psych:  Normal affect   EKG:  The EKG was personally reviewed and  demonstrates: Atrial fibrillation at 88 bpm with incomplete right bundle branch block Telemetry:  Telemetry was personally reviewed and demonstrates: Atrial fibrillation with rate in the 80s  Relevant CV Studies:  ECHO: 11/07/30    IMPRESSIONS  1. Left ventricular ejection fraction, by estimation, is 60 to 65%. The  left ventricle has normal function. The left ventricle has no regional  wall motion abnormalities. Left ventricular diastolic parameters were  normal.   2. Right ventricular systolic function is normal. The right ventricular  size is normal. There is normal pulmonary artery systolic pressure. The  estimated right ventricular systolic pressure is 26.6 mmHg.   3. The mitral valve is grossly normal. Trivial mitral valve  regurgitation. No evidence of mitral stenosis.   4. The aortic valve is tricuspid. Aortic valve regurgitation is trivial.  No aortic stenosis is present.   5. The inferior vena cava is normal in size with greater than 50%  respiratory variability, suggesting right atrial pressure of 3 mmHg.   Comparison(s): No significant change from prior study.    Laboratory Data:  High Sensitivity Troponin:   Recent Labs  Lab 01/30/21 1216 01/30/21 1413  TROPONINIHS 7 8     Chemistry Recent Labs  Lab 01/30/21 1216  NA 139  K 4.0  CL 108  CO2 20*  GLUCOSE 97  BUN 18  CREATININE 1.53*  CALCIUM 8.7*  GFRNONAA 45*  ANIONGAP 11    No results for input(s): PROT, ALBUMIN, AST, ALT, ALKPHOS, BILITOT in the last 168 hours. Hematology Recent Labs  Lab 01/30/21 1216  WBC 5.9  RBC 4.52  HGB 14.3  HCT 44.5  MCV 98.5  MCH 31.6  MCHC 32.1  RDW 13.5  PLT 210   BNPNo results for input(s): BNP, PROBNP in the last 168 hours.  DDimer No results for input(s): DDIMER in the last 168 hours. Lipid Panel     Component Value Date/Time   CHOL 218 (H) 11/06/2020 1121   TRIG 97 11/06/2020 1121  HDL 49 11/06/2020 1121   CHOLHDL 4.4 11/06/2020 1121   CHOLHDL 5  09/26/2012 0907   VLDL 23.6 09/26/2012 0907   LDLCALC 152 (H) 11/06/2020 1121   LDLDIRECT 142 (H) 11/06/2020 1121   LABVLDL 17 11/06/2020 1121     Radiology/Studies:  No results found. E Assessment and Plan:   New onset atrial fibrillation: The patient developed new onset transient 30 minute episode of lightheadedness and gait abnormality and was found to be in atrial fibrillation.  ECG confirms atrial fibrillation and telemetry presently continues to show atrial fibrillation with rates in the 80s.  CHA2DS2-VASc score is a 4 and  he should be transition to anticoagulation if neurologic imaging does not contradict systemic anticoagulation.  We will add very low dose beta-blocker therapy for additional rate control must be cautious of bradycardia with prior history.  We will schedule patient for 2D echo Doppler.  TIA/CVA: Currently undergoing stroke work-up.  Evaluated by [facility] hospitalist.  Neuroimaging with CT of his head completed and MRI not yet done.  CAD: Status post CABG revascularization surgery with and has multiple PCI's, last PCI in 2014.  At his last catheterization only one of his 4 bypass grafts were patent.  He has been on long-term Plavix his multiple stents and due to easy bruisability was taken off aspirin.  Remotely he was taken off beta-blockers due to bradycardia.  He does not any anginal symptomatology.  Echo Doppler study in March 2022 showed an EF of 60 to 65%.  With his last stent being placed in 2016, if patient is able to start anticoagulation for his atrial fibrillation, consider reinstituting low-dose baby aspirin and discontinuing Plavix.  Hyperlipidemia: He apparently has been intolerant to statin.  When asked about Zetia he believes he may have tried this.  With his CAD and elevated lipids he should strongly consider PCSK9 inhibition with Repatha or Praluent.  CKD: Creatinine 1.53, GFR estimate 45  6.   Ankle edema: May improved with bedrest if not as needed  low-dose diuretic         Risk Assessment/Risk Scores:        CHA2DS2-VASc Score = 4  This indicates a 4.8% annual risk of stroke. The patient's score is based upon: CHF History: No HTN History: Yes Diabetes History: No Stroke History: No Vascular Disease History: Yes (cad) Age Score: 2 Gender Score: 0        For questions or updates, please contact [facility] Please consult [email or web address] for contact info under    Signed, Nicki Guadalajara, MD  01/30/2021 6:41 PM

## 2021-01-30 NOTE — ED Provider Notes (Signed)
[facility]    CSN: [id number] Arrival date & time: 01/30/21  1045      History   Chief Complaint Chief Complaint  Patient presents with   Dizziness    HPI Steve Gordon is a 85 y.o. male.   HPI  Dizziness: Patient reports that he woke up around 4 AM and was extremely dizzy in bed.  He states that he went back to sleep and woke up shortly after again dizzy.  He states that he got up and took nitro which did help his symptoms.  He states that during these dizziness episodes he did not have any chest pain, shortness of breath, jaw pain, diaphoresis or arm pain.  He states that as of right now the dizziness has fully resolved but his wife has sent him here to be checked out.  He is also not currently having any chest pain or shortness of breath. No head injury, falls, or visual changes.    Past Medical History:  Diagnosis Date   CAD (coronary artery disease)    a. Ant MI 1998;  b. 2003: s/p CABG x 4 (VG->Diag, VG->OM, VG->PDA, LIMA->LAD);  c. PCI 2008: RCA - 4.0x16 Liberte BMS;  d. 07/2010 PCI: OM1- 3.5x20 Promus DES;  e. 03/2013 Cath/PCI: LM nl, LAD 60-70p/m (FFR 0.9->Med Rx), Diag 100, LCX patent prox stent, OM1 95-90(3.0x32 Promus Premier DES), RCA dom, 106m, 30 ISR, VG->Diag ok, VG->OM KTBO, VG->PDA KTBO, LIMA->LAD known atresia, EF 55%   Dyslipidemia    Hypertension    Statin intolerance     Patient Active Problem List   Diagnosis Date Noted   Statin myopathy 11/06/2020   Chronic venous insufficiency 10/15/2019   BPPV (benign paroxysmal positional vertigo), unspecified laterality 09/26/2019   CKD (chronic kidney disease) stage 3, GFR 30-59 ml/min (HCC) 12/14/2018   Allergic rhinitis 11/29/2017   Rash and nonspecific skin eruption 11/12/2015   Coronary artery disease involving native coronary artery of native heart without angina pectoris    Hyperlipidemia    Syncope 07/12/2015   Sinus bradycardia 12/14/2011   Incidental lung nodule, > 33mm and < 22mm 11/25/2010    Coronary artery disease 11/25/2010   HYPERTENSION, BENIGN 08/26/2010   HYPERCHOLESTEROLEMIA  IIA 03/07/2009   CAD, ARTERY BYPASS GRAFT 03/07/2009    Past Surgical History:  Procedure Laterality Date   ANKLE FRACTURE SURGERY Left ~ 1999   "broke in 1993; casted; PCP said not broken; had MI; walked for rehab; wore ankle out; put in plates and screws" (03/26/2013)   bronchogenic cyst resection  08/06/1997   CHOLECYSTECTOMY     CORONARY ANGIOPLASTY WITH STENT PLACEMENT  1998-03/26/2013   "2 at one time in 1998; this one  today is the 3rd one I've had since [facility] in 2003" (03/26/2013)   CORONARY ANGIOPLASTY WITH STENT PLACEMENT  10/05/2006   4.56mmx 88mm Liberte non DES   CORONARY ARTERY BYPASS GRAFT  2003   x4 LIMA to LAD SVG digonal, OM, PDA   INGUINAL HERNIA REPAIR Right    "3 times" (03/26/2013)   LAPAROSCOPIC INCISIONAL / UMBILICAL / VENTRAL HERNIA REPAIR  2004   LEFT HEART CATHETERIZATION WITH CORONARY/GRAFT ANGIOGRAM N/A 03/26/2013   Procedure: LEFT HEART CATHETERIZATION WITH Isabel Caprice;  Surgeon: Kathleene Hazel, MD;  Location: [facility] CATH LAB;  Service: Cardiovascular;  Laterality: N/A;       Home Medications    Prior to Admission medications   Medication Sig Start Date End Date Taking? Authorizing Provider  amLODipine (NORVASC)  10 MG tablet Take 1 tablet (10 mg total) by mouth daily. 10/14/20   Dyann Kief, PA-C  clopidogrel (PLAVIX) 75 MG tablet Take 1 tablet (75 mg total) by mouth daily. 10/14/20   Dyann Kief, PA-C  fluticasone (FLONASE) 50 MCG/ACT nasal spray Place 2 sprays into both nostrils daily. 11/29/17   Almon Hercules, MD  meclizine (ANTIVERT) 25 MG tablet Take 1 tablet (25 mg total) by mouth 3 (three) times daily as needed for dizziness. 09/26/19   Derrel Nip, MD  nitroGLYCERIN (NITROSTAT) 0.4 MG SL tablet Place 1 tablet (0.4 mg total) under the tongue every 5 (five) minutes as needed for chest pain. 11/28/20   Kathleene Hazel, MD   triamcinolone ointment (KENALOG) 0.5 % Apply 1 application topically 2 (two) times daily. To rash on feet and ankle 10/15/19   Moses Manners, MD    Family History Family History  Problem Relation Age of Onset   Hypertension Other    Heart disease Other    Kidney cancer Other     Social History Social History   Tobacco Use   Smoking status: Never   Smokeless tobacco: Never  Vaping Use   Vaping Use: Never used  Substance Use Topics   Alcohol use: Yes    Comment: 03/26/2013 "couple shots some weeks; none for 2 months; just take a drink q now and then"   Drug use: No     Allergies   Patient has no known allergies.   Review of Systems Review of Systems  As stated above in HPI Physical Exam Triage Vital Signs ED Triage Vitals  Enc Vitals Group     BP 01/30/21 1049 (!) 144/86     Pulse Rate 01/30/21 1049 100     Resp --      Temp 01/30/21 1049 98.2 F (36.8 C)     Temp Source 01/30/21 1049 Oral     SpO2 01/30/21 1049 97 %     Weight --      Height --      Head Circumference --      Peak Flow --      Pain Score 01/30/21 1051 0     Pain Loc --      Pain Edu? --      Excl. in GC? --    No data found.  Updated Vital Signs BP (!) 144/86 (BP Location: Right Arm)   Pulse 100   Temp 98.2 F (36.8 C) (Oral)   SpO2 97%   Physical Exam Vitals and nursing note reviewed.  Constitutional:      Appearance: Normal appearance.  HENT:     Head: Normocephalic and atraumatic.     Nose: Nose normal.  Eyes:     Extraocular Movements: Extraocular movements intact.     Pupils: Pupils are equal, round, and reactive to light.  Cardiovascular:     Rate and Rhythm: Normal rate. Rhythm irregular.     Pulses: Normal pulses.     Heart sounds: Normal heart sounds.     Comments: NO carotid bruits auscultated bilaterally  Pulmonary:     Effort: Pulmonary effort is normal.     Breath sounds: Normal breath sounds.  Musculoskeletal:     Cervical back: Neck supple. No  rigidity.  Skin:    General: Skin is warm.     Coloration: Skin is not pale.  Neurological:     General: No focal deficit present.     Mental Status:  He is alert and oriented to person, place, and time.     Cranial Nerves: No cranial nerve deficit.     Sensory: No sensory deficit.     Motor: No weakness.     Coordination: Coordination normal.     Gait: Gait normal.     Deep Tendon Reflexes: Reflexes normal.     UC Treatments / Results  Labs (all labs ordered are listed, but only abnormal results are displayed) Labs Reviewed - No data to display  EKG   Radiology No results found.  Procedures Procedures (including critical care time)  Medications Ordered in UC Medications - No data to display  Initial Impression / Assessment and Plan / UC Course  I have reviewed the triage vital signs and the nursing notes.  Pertinent labs & imaging results that were available during my care of the patient were reviewed by me and considered in my medical decision making (see chart for details).     New.  EKG shows new onset atrial fibrillation.  I discussed with patient that this could be causing his symptoms but there is also some concern that he could have had a TIA which I discussed.  For both these reasons he needs to be evaluated further in the emergency room for further monitoring and testing.  I discussed this with patient and his wife who drove him today.  As he appears stable and is not currently having active dizziness or any strokelike symptoms they elected to have him transported via private vehicle. Final Clinical Impressions(s) / UC Diagnoses   Final diagnoses:  None   Discharge Instructions   None    ED Prescriptions   None    PDMP not reviewed this encounter.   Rushie Chestnut, [place] 01/30/21 1122

## 2021-01-30 NOTE — ED Provider Notes (Addendum)
[facility] EMERGENCY DEPARTMENT Provider Note   CSN: [id number] Arrival date & time: 01/30/21  1153     History No chief complaint on file.   Steve Gordon is a 85 y.o. male.  The history is provided by the patient and medical records. No language interpreter was used.   85 year old male significant history of CAD, LVH, hypertension, CKD, sent here from urgent care center and due to evaluation of new onset A. fib and dizziness.  Patient report this morning when he got up to use the bathroom he felt very unsteady on his feet lightheadedness and dizzy.  His wife gave him nitro despite the fact that he did not have any significant chest pain or shortness of breath.  Symptom lasting for about 30 minutes but resolved.  At that time he did not complain of any headache, vision changes, confusion, chest pain, slurred speech, having focal numbness or focal weakness.  He did went to urgent care for checkup but was recommended to come to ER for evaluation when he was found to have new onset A. fib.  He denies experiencing any heart palpitation.  At this time patient states he is at his baseline denies any nausea vomiting or diarrhea.    Past Medical History:  Diagnosis Date   CAD (coronary artery disease)    a. Ant MI 1998;  b. 2003: s/p CABG x 4 (VG->Diag, VG->OM, VG->PDA, LIMA->LAD);  c. PCI 2008: RCA - 4.0x16 Liberte BMS;  d. 07/2010 PCI: OM1- 3.5x20 Promus DES;  e. 03/2013 Cath/PCI: LM nl, LAD 60-70p/m (FFR 0.9->Med Rx), Diag 100, LCX patent prox stent, OM1 95-90(3.0x32 Promus Premier DES), RCA dom, 90m, 30 ISR, VG->Diag ok, VG->OM KTBO, VG->PDA KTBO, LIMA->LAD known atresia, EF 55%   Dyslipidemia    Hypertension    Statin intolerance     Patient Active Problem List   Diagnosis Date Noted   Statin myopathy 11/06/2020   Chronic venous insufficiency 10/15/2019   BPPV (benign paroxysmal positional vertigo), unspecified laterality 09/26/2019   CKD (chronic kidney disease)  stage 3, GFR 30-59 ml/min (HCC) 12/14/2018   Allergic rhinitis 11/29/2017   Rash and nonspecific skin eruption 11/12/2015   Coronary artery disease involving native coronary artery of native heart without angina pectoris    Hyperlipidemia    Syncope 07/12/2015   Sinus bradycardia 12/14/2011   Incidental lung nodule, > 42mm and < 43mm 11/25/2010   Coronary artery disease 11/25/2010   HYPERTENSION, BENIGN 08/26/2010   HYPERCHOLESTEROLEMIA  IIA 03/07/2009   CAD, ARTERY BYPASS GRAFT 03/07/2009    Past Surgical History:  Procedure Laterality Date   ANKLE FRACTURE SURGERY Left ~ 1999   "broke in 1993; casted; PCP said not broken; had MI; walked for rehab; wore ankle out; put in plates and screws" (03/26/2013)   bronchogenic cyst resection  08/06/1997   CHOLECYSTECTOMY     CORONARY ANGIOPLASTY WITH STENT PLACEMENT  1998-03/26/2013   "2 at one time in 1998; this one  today is the 3rd one I've had since [facility] in 2003" (03/26/2013)   CORONARY ANGIOPLASTY WITH STENT PLACEMENT  10/05/2006   4.50mmx 40mm Liberte non DES   CORONARY ARTERY BYPASS GRAFT  2003   x4 LIMA to LAD SVG digonal, OM, PDA   INGUINAL HERNIA REPAIR Right    "3 times" (03/26/2013)   LAPAROSCOPIC INCISIONAL / UMBILICAL / VENTRAL HERNIA REPAIR  2004   LEFT HEART CATHETERIZATION WITH CORONARY/GRAFT ANGIOGRAM N/A 03/26/2013   Procedure: LEFT HEART CATHETERIZATION WITH CORONARY/GRAFT  ANGIOGRAM;  Surgeon: Kathleene Hazel, MD;  Location: [facility] CATH LAB;  Service: Cardiovascular;  Laterality: N/A;       Family History  Problem Relation Age of Onset   Hypertension Other    Heart disease Other    Kidney cancer Other     Social History   Tobacco Use   Smoking status: Never   Smokeless tobacco: Never  Vaping Use   Vaping Use: Never used  Substance Use Topics   Alcohol use: Yes    Comment: 03/26/2013 "couple shots some weeks; none for 2 months; just take a drink q now and then"   Drug use: No    Home Medications Prior to  Admission medications   Medication Sig Start Date End Date Taking? Authorizing Provider  amLODipine (NORVASC) 10 MG tablet Take 1 tablet (10 mg total) by mouth daily. 10/14/20   Dyann Kief, PA-C  clopidogrel (PLAVIX) 75 MG tablet Take 1 tablet (75 mg total) by mouth daily. 10/14/20   Dyann Kief, PA-C  fluticasone (FLONASE) 50 MCG/ACT nasal spray Place 2 sprays into both nostrils daily. 11/29/17   Almon Hercules, MD  meclizine (ANTIVERT) 25 MG tablet Take 1 tablet (25 mg total) by mouth 3 (three) times daily as needed for dizziness. 09/26/19   Derrel Nip, MD  nitroGLYCERIN (NITROSTAT) 0.4 MG SL tablet Place 1 tablet (0.4 mg total) under the tongue every 5 (five) minutes as needed for chest pain. 11/28/20   Kathleene Hazel, MD  triamcinolone ointment (KENALOG) 0.5 % Apply 1 application topically 2 (two) times daily. To rash on feet and ankle 10/15/19   Moses Manners, MD    Allergies    Patient has no known allergies.  Review of Systems   Review of Systems  All other systems reviewed and are negative.  Physical Exam Updated Vital Signs BP (!) 144/88   Pulse 94   Temp 98.4 F (36.9 C) (Oral)   Resp (!) 21   SpO2 97%   Physical Exam Vitals and nursing note reviewed.  Constitutional:      General: He is not in acute distress.    Appearance: He is well-developed.  HENT:     Head: Atraumatic.  Eyes:     Conjunctiva/sclera: Conjunctivae normal.  Cardiovascular:     Rate and Rhythm: Rhythm irregular.     Pulses: Normal pulses.     Heart sounds: Normal heart sounds.  Pulmonary:     Effort: Pulmonary effort is normal.     Breath sounds: Normal breath sounds.  Abdominal:     General: Abdomen is flat.     Palpations: Abdomen is soft.     Tenderness: There is no abdominal tenderness.  Musculoskeletal:        General: Normal range of motion.     Cervical back: Neck supple.     Right lower leg: Edema present.     Left lower leg: No edema.  Skin:    Findings: No  rash.  Neurological:     Mental Status: He is alert and oriented to person, place, and time.     GCS: GCS eye subscore is 4. GCS verbal subscore is 5. GCS motor subscore is 6.     Cranial Nerves: Cranial nerves are intact.     Sensory: Sensation is intact.     Motor: Motor function is intact.     Coordination: Coordination is intact.     Gait: Gait is intact.    ED  Results / Procedures / Treatments   Labs (all labs ordered are listed, but only abnormal results are displayed) Labs Reviewed  BASIC METABOLIC PANEL - Abnormal; Notable for the following components:      Result Value   CO2 20 (*)    Creatinine, Ser 1.53 (*)    Calcium 8.7 (*)    GFR, Estimated 45 (*)    All other components within normal limits  SARS CORONAVIRUS 2 (TAT 6-24 HRS)  CBC  RAPID URINE DRUG SCREEN, HOSP PERFORMED  TROPONIN I (HIGH SENSITIVITY)  TROPONIN I (HIGH SENSITIVITY)    EKG EKG Interpretation  Date/Time:  Friday January 30 2021 11:58:38 EDT Ventricular Rate:  88 PR Interval:    QRS Duration: 94 QT Interval:  352 QTC Calculation: 425 R Axis:   61 Text Interpretation: Atrial fibrillation Incomplete right bundle branch block Abnormal ECG Confirmed by Tilden Fossa [phone]) on 01/30/2021 12:30:32 PM  Radiology No results found.  Procedures Procedures   Medications Ordered in ED Medications - No data to display  ED Course  I have reviewed the triage vital signs and the nursing notes.  Pertinent labs & imaging results that were available during my care of the patient were reviewed by me and considered in my medical decision making (see chart for details).    MDM Rules/Calculators/A&P                          BP 135/85   Pulse 73   Temp 98.4 F (36.9 C) (Oral)   Resp 18   SpO2 97%   Final Clinical Impression(s) / ED Diagnoses Final diagnoses:  New onset atrial fibrillation (HCC)  TIA (transient ischemic attack)    Rx / DC Orders ED Discharge Orders     None      2:08  PM Patient endorsed feeling a bit dizzy and lightheadedness this morning that is subsequently subsided.  Was seen at urgent care center shortly afterward he was found to have new onset A. fib and was sent here. CHAD2VASC score of 4, likely will benefit anticoagulation. He does have evidence of atrial fibrillation on EKG. although he does not have any focal neurodeficit on exam, his ABCD2 score is 3.    2:46 PM At this time patient is resting comfortably.  I discussed care with Dr. Madilyn Hook, we felt patient would benefit from hospital admission due to new onset A. fib as well as TIA.  I have ordered head CT scan along with MRI of the brain and MRA of the head.  Appreciate consultation from family medicine resident who agrees to see and will admit patient for further care.  3:36 PM Per request of family medicine resident I have consulted on call neurology Dr. Derry Lory who agrees to be involve in pt care.    Fayrene Helper, PA-C 01/30/21 1449    Fayrene Helper, PA-C 01/30/21 1536    Tilden Fossa, MD 01/31/21 [phone]

## 2021-01-30 NOTE — ED Triage Notes (Signed)
Pt here from home with c/o dizziness and not feeling well ,sent down from UC , took a nitro at home and feels better at this time

## 2021-01-30 NOTE — ED Notes (Signed)
Pt remains in MRI at this time  

## 2021-01-30 NOTE — H&P (Addendum)
Family Medicine Teaching [facility] Admission History and Physical Service Pager: [phone]  Patient name: Steve Gordon Medical record number: [id number] Date of birth: 12/30/1935 Age: 85 y.o. Gender: male  Primary Care Provider: Moses MannersHensel, William A, MD Consultants: Neuro Code Status: FULL  Preferred Emergency Contact: Courtney HeysWanda Carfagno (Wife) [phone]  Chief Complaint: Dizziness  Assessment and Plan: Steve Gordon is a 85 y.o. male presenting with dizziness and new onset atrial fibrillation . PMH is significant for CAD (s/p 4V CABG 2003), HLD (with statin intolerance), HTN, CKD, mild mitral regurgitation.  New onset atrial fibrillation  Hx of CAD (s/p 4V CABG 2003) Patient admitted with new onset atrial fibrillation discovered at urgent care after an episode of dizziness this morning.  On arrival, patient initially tachycardic to 102 and hemodynamically stable. Troponin 7>8, EKG showing atrial fibrillation. Low concern for ACS given lack of symptoms and negative troponins. Given new onset atrial fibrillation and possible need for anticoagulation but this initial concern for TIA without imaging reported we will hold off on administering medications at this time.  Patient will likely need to have anticoagulation given the new onset A. fib and his cardiac history. Home medications include Plavix 75 mg daily. - Admit to FPTS, cardiac telemetry, attending Dr. Deirdre Priesthambliss - Cardiology consulted, appreciate recs  - Holding anticoagulation at this time awaiting imaging and recommendations from neurology and cardiology - Continuous cardiac telemetry - Holding DVT prophylaxis pending cardiology recs - Follow-up A1c and TSH - Follow-up PT/INR - PT/OT eval and treat - Follow-up echocardiogram  Concern for TIA Patient admitted with dizziness this morning causing gait instability, which have all resolved without neurologic deficit and normal exam. Unsure if this is true TIA vs stroke vs BVVP  picture, though more concern with new presentation of atrial fibrillation. Patient has been seen in the past (09/26/2019) for BPPV; he states that this is worse than he has ever had before and has previously been treated with meclizine. Patient has a history of statin intolerance and was seen in lipid clinic to consider starting Repatha. - vitals per floor protocol - neuro following; appreciate recs - monitor on telemetry - echo - follow-up CT head reading - follow-up MRI brain and MRA head (ordered by ED provider) - permissive hypertension x24 hours <220/110 - risk strat labs: hgb A1C, TSH - swallow eval and then place diet order - PT/OT - ASA 81mg  daily - Holding Plavix 75mg  awaiting recommendations from cardiology   Right lower extremity swelling Chronic right lower extremity swelling, noted to have 1+ pitting edema to the shin.  Has been seen in the past for concern for chronic venous insufficiency.  Patient did have cardiac surgery with saphenous vein graft that could be contributing to edema.  Low suspicion for DVT at this time. - Continue to monitor - Follow-up echocardiogram  CKD Stage IIIa Creatnine 1.53 on admission with [id number]. Baseline creatinine appears to be around 1.3. - Continue to monitor with BMP  HTN BP on admission mildly elevated to 150s/80s. Home medications include Amlodipine 10mg  daily, which will be held for permissive hypertension.  - Hold Amlodipine for permissive hypertension  FEN/GI: NPO until swallow study passed Prophylaxis: SCDs  Disposition: Cardiac telemetry  History of Present Illness:  Steve Gordon is a 85 y.o. male presenting with dizziness.   Patient was this morning he woke up and did not feel very well.  While he was lying down he noticed that he was dizzy and everything was spinning.  When  walking the restroom he felt like if he "he drank about a bottle of liquor" and was moving side to side and he called out to his wife.  Wife attempted to  check the blood pressure and patient took a nitroglycerin pill in case he was having a heart attack.  After 15 to 30 minutes the dizziness went away on its own without any further intervention.  Patient was able to sit down to get coffee and does practice as normal.  They were concerned that since he has had cardiac history and has had a large surgery that it could be related to heart stable to the urgent care.  They to get an EKG and were told to come to the hospital.  He was able to walk into the ED from the parking deck without issue.  He denies headache, changes in vision, and nausea. No difficulty swallowing, finding words or slurring speech. Denies weakness. Denies ever being diagnosed with Afib.    Review Of Systems: Per HPI with the following additions:   Review of Systems  HENT:  Negative for congestion.   Eyes:  Negative for visual disturbance.  Respiratory:  Negative for chest tightness, shortness of breath and wheezing.   Cardiovascular:  Positive for leg swelling. Negative for chest pain and palpitations.  Gastrointestinal:  Negative for abdominal pain, constipation, diarrhea and nausea.  Neurological:  Positive for dizziness. Negative for syncope, speech difficulty, weakness, light-headedness and headaches.  Psychiatric/Behavioral:  Negative for confusion.     Patient Active Problem List   Diagnosis Date Noted   New onset a-fib (HCC) 01/30/2021   Statin myopathy 11/06/2020   Chronic venous insufficiency 10/15/2019   BPPV (benign paroxysmal positional vertigo), unspecified laterality 09/26/2019   CKD (chronic kidney disease) stage 3, GFR 30-59 ml/min (HCC) 12/14/2018   Allergic rhinitis 11/29/2017   Rash and nonspecific skin eruption 11/12/2015   Coronary artery disease involving native coronary artery of native heart without angina pectoris    Hyperlipidemia    Syncope 07/12/2015   Sinus bradycardia 12/14/2011   Incidental lung nodule, > 61mm and < 48mm 11/25/2010   Coronary  artery disease 11/25/2010   HYPERTENSION, BENIGN 08/26/2010   HYPERCHOLESTEROLEMIA  IIA 03/07/2009   CAD, ARTERY BYPASS GRAFT 03/07/2009    Past Medical History: Past Medical History:  Diagnosis Date   CAD (coronary artery disease)    a. Ant MI 1998;  b. 2003: s/p CABG x 4 (VG->Diag, VG->OM, VG->PDA, LIMA->LAD);  c. PCI 2008: RCA - 4.0x16 Liberte BMS;  d. 07/2010 PCI: OM1- 3.5x20 Promus DES;  e. 03/2013 Cath/PCI: LM nl, LAD 60-70p/m (FFR 0.9->Med Rx), Diag 100, LCX patent prox stent, OM1 95-90(3.0x32 Promus Premier DES), RCA dom, 50m, 30 ISR, VG->Diag ok, VG->OM KTBO, VG->PDA KTBO, LIMA->LAD known atresia, EF 55%   Dyslipidemia    Hypertension    Statin intolerance     Past Surgical History: Past Surgical History:  Procedure Laterality Date   ANKLE FRACTURE SURGERY Left ~ 1999   "broke in 1993; casted; PCP said not broken; had MI; walked for rehab; wore ankle out; put in plates and screws" (03/26/2013)   bronchogenic cyst resection  08/06/1997   CHOLECYSTECTOMY     CORONARY ANGIOPLASTY WITH STENT PLACEMENT  1998-03/26/2013   "2 at one time in 1998; this one  today is the 3rd one I've had since [facility] in 2003" (03/26/2013)   CORONARY ANGIOPLASTY WITH STENT PLACEMENT  10/05/2006   4.38mmx 50mm Liberte non DES   CORONARY ARTERY  BYPASS GRAFT  2003   x4 LIMA to LAD SVG digonal, OM, PDA   INGUINAL HERNIA REPAIR Right    "3 times" (03/26/2013)   LAPAROSCOPIC INCISIONAL / UMBILICAL / VENTRAL HERNIA REPAIR  2004   LEFT HEART CATHETERIZATION WITH CORONARY/GRAFT ANGIOGRAM N/A 03/26/2013   Procedure: LEFT HEART CATHETERIZATION WITH Isabel Caprice;  Surgeon: Kathleene Hazel, MD;  Location: [facility] CATH LAB;  Service: Cardiovascular;  Laterality: N/A;    Social History: Social History   Tobacco Use   Smoking status: Never   Smokeless tobacco: Never  Vaping Use   Vaping Use: Never used  Substance Use Topics   Alcohol use: Yes    Comment: 03/26/2013 "couple shots some weeks; none for 2  months; just take a drink q now and then"   Drug use: No   Additional social history:   Please also refer to relevant sections of EMR.  Family History: Family History  Problem Relation Age of Onset   Hypertension Other    Heart disease Other    Kidney cancer Other    Allergies and Medications: No Known Allergies No current facility-administered medications on file prior to encounter.   Current Outpatient Medications on File Prior to Encounter  Medication Sig Dispense Refill   amLODipine (NORVASC) 10 MG tablet Take 1 tablet (10 mg total) by mouth daily. 90 tablet 3   clopidogrel (PLAVIX) 75 MG tablet Take 1 tablet (75 mg total) by mouth daily. 90 tablet 3   nitroGLYCERIN (NITROSTAT) 0.4 MG SL tablet Place 1 tablet (0.4 mg total) under the tongue every 5 (five) minutes as needed for chest pain. 25 tablet 7   fluticasone (FLONASE) 50 MCG/ACT nasal spray Place 2 sprays into both nostrils daily. (Patient not taking: Reported on 01/30/2021) 16 g 6   meclizine (ANTIVERT) 25 MG tablet Take 1 tablet (25 mg total) by mouth 3 (three) times daily as needed for dizziness. (Patient not taking: Reported on 01/30/2021) 20 tablet 0   triamcinolone ointment (KENALOG) 0.5 % Apply 1 application topically 2 (two) times daily. To rash on feet and ankle (Patient not taking: Reported on 01/30/2021) 30 g 6    Objective: BP (!) 158/89   Pulse 84   Temp 98.4 F (36.9 C) (Oral)   Resp 15   SpO2 95%  Exam: General -- oriented x3, pleasant and cooperative. HEENT -- Head is normocephalic. PERRLA. EOMI. Ears, nose and throat were benign. Neck -- supple; no bruits. Integument -- intact. No rash, erythema, or ecchymoses.  Chest -- good expansion. Lungs clear to auscultation. Cardiac -- irregularly irregular. No murmurs noted.  Abdomen -- soft, nontender. No masses palpable. Bowel sounds present. Extremities - 1+ pitting edema in RLE up to the shin. 5/5 bilateral strength. Dorsalis pedis pulses present and  symmetrical.  Neuro: CN II: PERRL CN III, IV,VI: EOMI CV V: Normal sensation in V1, V2, V3 CVII: Symmetric smile and brow raise CN VIII: Normal hearing CN IX,X: Symmetric palate raise  CN XI: 5/5 shoulder shrug CN XII: Symmetric tongue protrusion  UE and LE strength 5/5   Labs and Imaging: CBC BMET  Recent Labs  Lab 01/30/21 1216  WBC 5.9  HGB 14.3  HCT 44.5  PLT 210   Recent Labs  Lab 01/30/21 1216  NA 139  K 4.0  CL 108  CO2 20*  BUN 18  CREATININE 1.53*  GLUCOSE 97  CALCIUM 8.7*     EKG: Atrial fibrillation   No results found.   Lilland,  Alana, DO 01/30/2021, 5:46 PM PGY-1, [facility] FPTS Intern pager: [phone], text pages welcome   FPTS Upper-Level Resident Addendum   I have independently interviewed and examined the patient. I have discussed the above with the original author and agree with their documentation. My edits for correction/addition/clarification are within the document. Please see also any attending notes.   Lavonda Jumbo, DO PGY-2, [facility] 01/30/2021 6:45 PM  FPTS Service pager: [phone] (text pages welcome through [facility])

## 2021-01-30 NOTE — Consult Note (Signed)
Neurology consult   CC: 30 min episode or lightheadedness with new AF. Referring MD: Dr. Madilyn Hook.   History is obtained from: patient and wife.   HPI:  [name] is an 85 yo male with a PMHx of CAD s/p CABG in 2003 and PCI 2008, MVR, HTN, CKD, and sinus bradycardia who presented to the urgent care center this am after having a 30 minute episode of unsteady gait, dizziness, and lightheadedness. At the [facility], he was found to be in AF and was sent to the ED. He has never had this happen to him before. He has never been told he had AF. Denies any chest pain or palpitations this am. Rest per ROS. No history of vertigo. He has never had a stroke or a seizure.   Mr. [name] sees cardiology frequently. One of his issues besides above is his high LDL with intolerance to statins. "I've tried every one". Statins cause leg pain/muscle aches. In review of chart, NP saw a recent visit with pharmacy to discuss cholesterol therapy. Repatha was offered as an alternative. Patient and wife wanted to think about it. Pharmacist was to help with a grant for cost. Today, NP discussed this with patient. NP informed him, that now that he has AF which causes an increased risk of stroke that he may want to seriously consider Repatha as we never know which stroke will be the debilitating one.   NP explained AF and how it relates to stroke risk and that he will be admitted to hospital for a TIA workup for the symptoms he had this am. We are awaiting his MRI brain, MRA head and neck to result.   NP explained that his Plavix (for history of stenting) could be changed in the future for an [facility] medication due to his AF. This will be considered at a later date.   The only problem he had with ASA was bruising easily. He states he can take it if he needs it.   Neurology was asked to consult for TIA/stroke workup.   LKW: bedtime 6/223/22. Awoke with symptoms at 0600 today.  tpa given?: no outside the window and resolved symptoms  IR  Thrombectomy?: no not a candidate.  MRS 0  NIHSS:  1a Level of Consciousness: 0 1b LOC Questions: 0 1c LOC Commands: 0 2 Best Gaze: 0 3 Visual: 0 4 Facial Palsy: 0 5a Motor Arm - left: 0 5b Motor Arm - Right: 0 6a Motor Leg - Left: 0 6b Motor Leg - Right: 0 7 Limb Ataxia: 0 8 Sensory: 0 9 Best Language: 0 10 Dysarthria: 0 11 Extinction and Inattention: 0 TOTAL:  0  ROS: A robust ROS was performed. See HPI, plus patient denies HA, blurry or double vision, dysarthria, aphasia, facial droop, weakness of a particular extremity, confusion, LOC, fall, n/v, or post ictal period after this morning's symptoms.   Past Medical History:  Diagnosis Date   CAD (coronary artery disease)    a. Ant MI 1998;  b. 2003: s/p CABG x 4 (VG->Diag, VG->OM, VG->PDA, LIMA->LAD);  c. PCI 2008: RCA - 4.0x16 Liberte BMS;  d. 07/2010 PCI: OM1- 3.5x20 Promus DES;  e. 03/2013 Cath/PCI: LM nl, LAD 60-70p/m (FFR 0.9->Med Rx), Diag 100, LCX patent prox stent, OM1 95-90(3.0x32 Promus Premier DES), RCA dom, 3m, 30 ISR, VG->Diag ok, VG->OM KTBO, VG->PDA KTBO, LIMA->LAD known atresia, EF 55%   Dyslipidemia    Hypertension    Statin intolerance     Family History  Problem  Relation Age of Onset   Hypertension Other    Heart disease Other    Kidney cancer Other     Social History:  reports that he has never smoked. He has never used smokeless tobacco. He reports current alcohol use. He reports that he does not use drugs.   Prior to Admission medications   Medication Sig Start Date End Date Taking? Authorizing Provider  amLODipine (NORVASC) 10 MG tablet Take 1 tablet (10 mg total) by mouth daily. 10/14/20  Yes Dyann Kief, PA-C  clopidogrel (PLAVIX) 75 MG tablet Take 1 tablet (75 mg total) by mouth daily. 10/14/20  Yes Dyann Kief, PA-C  nitroGLYCERIN (NITROSTAT) 0.4 MG SL tablet Place 1 tablet (0.4 mg total) under the tongue every 5 (five) minutes as needed for chest pain. 11/28/20  Yes Kathleene Hazel, MD  fluticasone (FLONASE) 50 MCG/ACT nasal spray Place 2 sprays into both nostrils daily. Patient not taking: Reported on 01/30/2021 11/29/17   Almon Hercules, MD  meclizine (ANTIVERT) 25 MG tablet Take 1 tablet (25 mg total) by mouth 3 (three) times daily as needed for dizziness. Patient not taking: Reported on 01/30/2021 09/26/19   Derrel Nip, MD  triamcinolone ointment (KENALOG) 0.5 % Apply 1 application topically 2 (two) times daily. To rash on feet and ankle Patient not taking: Reported on 01/30/2021 10/15/19   Moses Manners, MD    Exam: Current vital signs: BP (!) 158/97   Pulse 82   Temp 98.4 F (36.9 C) (Oral)   Resp 17   SpO2 98%   Physical Exam  Constitutional: Appears well-developed and well-nourished.  Psych: Affect appropriate to situation. Smiling and laughing with NP.  Eyes: No scleral injection. HENT: No OP obstruction. Head: Normocephalic.  Cardiovascular: AF on tele.  Respiratory: Effort normal.  GI: Abdomen soft.  No distension. There is no tenderness.  Skin: WDI.  Neuro: Mental Status: Patient is awake, alert, oriented to person, place, month, year, and situation. Patient is able to give a clear and coherent history. No signs of neglect. Speech/Language:  Speech is clear, fluent without dysarthria or aphasia. Repetition, naming, and comprehension intact.  Cranial Nerves: II: Visual Fields are full. Pupils are equal, round, and reactive to light at 62mm.   III,IV, VI: EOMI without ptosis or diploplia.  V: Facial sensation is symmetric to light touch in V1, V2, and V3. VII: Facial movement symmetrical. No facial droop.  VIII: hearing is intact to voice. X: Uvula elevates symmetrically. XI: Shoulder shrug is symmetric. XII: tongue is midline without atrophy or fasciculations.  Motor: Strength is 5/5 to bilateral deltoids, biceps, triceps, thighs, knees, dorsiflexion and plantar flexion.  Sensory: Sensation is symmetric to light touch in all fours  extremities. Extinction absent to light touch DSS.  Plantars: Toes are downgoing bilaterally.  Cerebellar: No ataxia noted with FNF and HKS bilaterally. Finger roll without ataxia. Rapid hand movements intact.   MD reviewed the images obtained:  NCT head                            MRI brain                               all pending.  MRA head and neck   Assessment: 85 yo male with stroke risk factors of CAD, HLD, and HTN. Patient had a 30 min (only) episode this  am of lightheadedness and gait abnormality. He went to urgent care and was found to be in new AF and was sent here. Due to suspicion of TIA, he is having a workup, but imaging is still pending. He is on Plavix due to history of PCI and not on a statin due to intolerance. NP suggested he talk again to the out patient pharmacist and given today's findings, seriously consider cholesterol treatment.  He may need AC in future, but we will continue Plavix now and add ASA 81 po qd, as with a suspected TIA with ABCD2 score under 4 (his is 3) DAPT is recommended for 21 days. However, cardiology should weigh in on continuing Plavix in future as opposed to [facility] given new onset AF. Do not believe his episode was a seizure due to no witnessed seizure activity and no post ictal period. His symptoms are likely due to new onset AF vs TIA.   Impression:  -new AF with dizziness x 30 minutes today.  -r/o TIA. Symptoms resolved after 30 minutes.   Plan: - Medicine admission.  -f/up imaging.  - Recommend TTE. - Recommend labs: HbA1c, TSH. -He has had recent lipid profile and it is known that he needs a lipid lowering medicine. He should f/up with pharmacist to start Repatha. Goal LDL per cardiology is < 55, neuro <70.  - Aspirin 81mg  daily. - Continue Clopidogrel 75mg  daily.  - SBP goal - Permissive hypertension first 24 h < 220/110. Hold home medications for now. - Telemetry monitoring for arrhythmia. - bedside Swallow screen. - Stroke education. -  NIHSS as per protocol. - frequent neuro checks.  - Recommend metabolic/infectious workup with UA with UCx, CXR, CK, serum lactate.   Electronically signed by: Patient seen by , MSN, APN-BC, nurse practitioner and by MD. Note/plan to be edited by MD as needed.  Pager: [phone]    NEUROHOSPITALIST ADDENDUM Performed a face to face diagnostic evaluation.   I have reviewed the contents of history and physical exam as documented by PA/ARNP/Resident and agree with above documentation.  I have discussed and formulated the above plan as documented. Edits to the note have been made as needed.  Impression/Key exam findings/Plan: Woke up with an episode of dysequilibrium that lasted about 30 mins, felt that ground was very shaky. No light headedness, no prior similar episode. Nausea but did not throw up. Was seen in urgent care and noted to have new onset paroxysmal Afibb. Symptoms are concerning for a posterior circulation stroke. Althou Afibb with RVR can present with syncopal/presyncopal episode, he denies feeling presyncopal, no chest pain or palpitation.  Will get stroke workup as above. Will likely need Anticoagulation. Timing based on what the MRI Brain shows. May need cardiology for further eval of new onset Afibb.  , MD [facility] Jimmye Norman   If 7pm to 7am, please call on call as listed on AMION.

## 2021-01-30 NOTE — ED Notes (Signed)
Patient transported to MRI 

## 2021-01-31 ENCOUNTER — Other Ambulatory Visit: Payer: Self-pay

## 2021-01-31 ENCOUNTER — Inpatient Hospital Stay (HOSPITAL_BASED_OUTPATIENT_CLINIC_OR_DEPARTMENT_OTHER): Payer: PPO

## 2021-01-31 ENCOUNTER — Inpatient Hospital Stay (HOSPITAL_COMMUNITY): Payer: PPO

## 2021-01-31 ENCOUNTER — Encounter (HOSPITAL_COMMUNITY): Payer: Self-pay | Admitting: Family Medicine

## 2021-01-31 DIAGNOSIS — I2510 Atherosclerotic heart disease of native coronary artery without angina pectoris: Secondary | ICD-10-CM | POA: Diagnosis not present

## 2021-01-31 DIAGNOSIS — R609 Edema, unspecified: Secondary | ICD-10-CM

## 2021-01-31 DIAGNOSIS — N183 Chronic kidney disease, stage 3 unspecified: Secondary | ICD-10-CM | POA: Diagnosis not present

## 2021-01-31 DIAGNOSIS — I4891 Unspecified atrial fibrillation: Secondary | ICD-10-CM | POA: Diagnosis not present

## 2021-01-31 DIAGNOSIS — E7800 Pure hypercholesterolemia, unspecified: Secondary | ICD-10-CM | POA: Diagnosis not present

## 2021-01-31 DIAGNOSIS — Z951 Presence of aortocoronary bypass graft: Secondary | ICD-10-CM | POA: Diagnosis not present

## 2021-01-31 DIAGNOSIS — R600 Localized edema: Secondary | ICD-10-CM | POA: Diagnosis not present

## 2021-01-31 DIAGNOSIS — Z20822 Contact with and (suspected) exposure to covid-19: Secondary | ICD-10-CM | POA: Diagnosis not present

## 2021-01-31 DIAGNOSIS — G459 Transient cerebral ischemic attack, unspecified: Secondary | ICD-10-CM | POA: Diagnosis not present

## 2021-01-31 DIAGNOSIS — I129 Hypertensive chronic kidney disease with stage 1 through stage 4 chronic kidney disease, or unspecified chronic kidney disease: Secondary | ICD-10-CM | POA: Diagnosis not present

## 2021-01-31 DIAGNOSIS — N1831 Chronic kidney disease, stage 3a: Secondary | ICD-10-CM | POA: Diagnosis not present

## 2021-01-31 DIAGNOSIS — Z79899 Other long term (current) drug therapy: Secondary | ICD-10-CM | POA: Diagnosis not present

## 2021-01-31 LAB — BASIC METABOLIC PANEL
Anion gap: 6 (ref 5–15)
BUN: 13 mg/dL (ref 8–23)
CO2: 27 mmol/L (ref 22–32)
Calcium: 9 mg/dL (ref 8.9–10.3)
Chloride: 106 mmol/L (ref 98–111)
Creatinine, Ser: 1.33 mg/dL — ABNORMAL HIGH (ref 0.61–1.24)
GFR, Estimated: 53 mL/min — ABNORMAL LOW (ref >60)
Glucose, Bld: 101 mg/dL — ABNORMAL HIGH (ref 70–99)
Potassium: 4.5 mmol/L (ref 3.5–5.1)
Sodium: 139 mmol/L (ref 135–145)

## 2021-01-31 LAB — HEMOGLOBIN A1C
Hgb A1c MFr Bld: 5.3 % (ref 4.8–5.6)
Mean Plasma Glucose: 105.41 mg/dL

## 2021-01-31 LAB — PROTIME-INR
INR: 1 (ref 0.8–1.2)
Prothrombin Time: 13.6 seconds (ref 11.4–15.2)

## 2021-01-31 LAB — ECHOCARDIOGRAM COMPLETE
AR max vel: 2.47 cm2
AV Area VTI: 2.72 cm2
AV Area mean vel: 2.38 cm2
AV Mean grad: 2 mmHg
AV Peak grad: 3.1 mmHg
Ao pk vel: 0.87 m/s
Height: 68 in
S' Lateral: 3.3 cm
Weight: 2617.3 oz

## 2021-01-31 LAB — LIPID PANEL
Cholesterol: 182 mg/dL (ref 0–200)
HDL: 41 mg/dL (ref >40)
LDL Cholesterol: 126 mg/dL — ABNORMAL HIGH (ref 0–99)
Total CHOL/HDL Ratio: 4.4 RATIO
Triglycerides: 77 mg/dL (ref <150)
VLDL: 15 mg/dL (ref 0–40)

## 2021-01-31 LAB — TSH: TSH: 0.444 u[IU]/mL (ref 0.350–4.500)

## 2021-01-31 LAB — MAGNESIUM: Magnesium: 2.1 mg/dL (ref 1.7–2.4)

## 2021-01-31 MED ORDER — ATORVASTATIN CALCIUM 40 MG PO TABS: 40.0000 mg | ORAL_TABLET | Freq: Every day | ORAL | Status: DC

## 2021-01-31 MED ORDER — APIXABAN 5 MG PO TABS: 5.0000 mg | ORAL_TABLET | Freq: Two times a day (BID) | ORAL | 0 refills | Status: DC

## 2021-01-31 MED ORDER — ASPIRIN 81 MG PO TBEC: 81.0000 mg | DELAYED_RELEASE_TABLET | Freq: Every day | ORAL | 1 refills | Status: DC

## 2021-01-31 MED ORDER — METOPROLOL TARTRATE 25 MG PO TABS: 12.5000 mg | ORAL_TABLET | Freq: Two times a day (BID) | ORAL | 1 refills | Status: DC

## 2021-01-31 MED ORDER — ATORVASTATIN CALCIUM 40 MG PO TABS: 40.0000 mg | ORAL_TABLET | Freq: Every day | ORAL | 0 refills | Status: DC

## 2021-01-31 MED ORDER — APIXABAN 5 MG PO TABS: 5.0000 mg | ORAL_TABLET | Freq: Two times a day (BID) | ORAL | Status: DC

## 2021-01-31 NOTE — Care Management (Addendum)
01-31-21 1405 Benefits check submitted for Apixaban 5 mg BID. Benefits check will not be completed until Monday. If the patient is discharging today, the Rx will need to be sent to the pharmacy for co pay cost.    [phone] 01-31-21 Staff RN called [facility] and Apixaban is in stock. The Pharmacy closes at 6:00 pm.

## 2021-01-31 NOTE — Discharge Summary (Signed)
Family Medicine Teaching [facility] Discharge Summary  Patient name: Steve Gordon Medical record number: [id number] Date of birth: [birth year] Age: 85 y.o. Gender: male Date of Admission: 01/30/2021  Date of Discharge: 02/01/24 Admitting Physician: Carney Living, MD  Primary Care Provider: Moses Manners, MD Consultants: Neurology, cardiology  Indication for Hospitalization: Dizziness  Discharge Diagnoses/Problem List:  New onset A. Fib CKD stage III Hypertension  Disposition: Home  Discharge Condition: Medically stable for discharge  Discharge Exam:  General: Alert, no acute distress, pleasant  Cardio: Normal S1 and S2, irregular  Pulm: CTAB, normal work of breathing Abdomen: Bowel sounds normal. Abdomen soft and non-tender. Extremities: No peripheral edema. Neuro: Cranial nerves grossly intact, PEERLA, 5/5 strength UE and LE, normal sensation throughout     Brief Hospital Course:  Steve Gordon is a 85 y.o. male presenting with dizziness and new onset atrial fibrillation. PMH is significant for CAD (s/p 4V CABG 2003), HLD (with statin intolerance), HTN, CKD, mild mitral regurgitation.   Concern for TIA Patient admitted with 1 episode of dizziness following new onset A. Fib.  His symptoms resolved prior to arriving at the hospital CT head, MRI brain and MRA brain: chronic sinusitis, signs of atrophy and chronic microvascular ischemic changes. Carotid Doppler  LICA 40-59%, RICA <39%.  Patient was seen by neurology and started on aspirin and Plavix.  Risk stratification labs: LDL 152, A1c 5.3. Started on 40mg  Atorvastatin.  New onset A. Fib Troponin 7>8, EKG showing atrial fibrillation.  CHA2DS2-VASc 3. score cardiology were consulted who recommended Eliquis 5 mg twice daily continue aspirin 81mg .  Also recommended metoprolol 12.5mg  BID. Echo showed 55 to 60%, mild concentric LVH,right ventricular size is mildly enlarged.  Hypertension Amlodipine held to allow  for permissive hypertension.     Issues for Follow Up:  Follow-up with neurology in 4 to 6 weeks For A. Fib: continue Eliquis 5 mg twice daily  Hold Amlodipine until neurology follow up allowing for permissive HTN.  Significant Procedures:   Significant Labs and Imaging:  Recent Labs  Lab 01/30/21 1216  WBC 5.9  HGB 14.3  HCT 44.5  PLT 210   Recent Labs  Lab 01/30/21 1216 01/31/21 0603  NA 139 139  K 4.0 4.5  CL 108 106  CO2 20* 27  GLUCOSE 97 101*  BUN 18 13  CREATININE 1.53* 1.33*  CALCIUM 8.7* 9.0  MG  --  2.1      Results/Tests Pending at Time of Discharge:   Discharge Medications:  Allergies as of 01/31/2021   No Known Allergies      Medication List     STOP taking these medications    amLODipine 10 MG tablet Commonly known as: NORVASC   clopidogrel 75 MG tablet Commonly known as: PLAVIX   fluticasone 50 MCG/ACT nasal spray Commonly known as: FLONASE   meclizine 25 MG tablet Commonly known as: ANTIVERT   triamcinolone ointment 0.5 % Commonly known as: KENALOG       TAKE these medications    apixaban 5 MG Tabs tablet Commonly known as: ELIQUIS Take 1 tablet (5 mg total) by mouth 2 (two) times daily.   aspirin 81 MG EC tablet Take 1 tablet (81 mg total) by mouth daily. Swallow whole. Start taking on: February 01, 2021   atorvastatin 40 MG tablet Commonly known as: LIPITOR Take 1 tablet (40 mg total) by mouth daily.   metoprolol tartrate 25 MG tablet Commonly known as: LOPRESSOR Take 0.5 tablets (12.5  mg total) by mouth 2 (two) times daily.   nitroGLYCERIN 0.4 MG SL tablet Commonly known as: NITROSTAT Place 1 tablet (0.4 mg total) under the tongue every 5 (five) minutes as needed for chest pain.        Discharge Instructions: Please refer to Patient Instructions section of EMR for full details.  Patient was counseled important signs and symptoms that should prompt return to medical care, changes in medications, dietary  instructions, activity restrictions, and follow up appointments.   Follow-Up Appointments:  Follow-up Information     [facility]. Schedule an appointment as soon as possible for a visit in 1 month(s).   Specialty: Neurology Why: stroke clinic Contact information: [address] [place] [address] [phone]                Towanda Octave, MD 01/31/2021, 4:04 PM PGY-2, [facility]

## 2021-01-31 NOTE — Progress Notes (Signed)
Progress Note  Patient Name: Steve Gordon Date of Encounter: 01/31/2021  [facility] Cardiologist: Verne Carrow, MD   Subjective   Brain MRI yesterday was negative.  Reports symptoms have improved.  Denies any chest pain or dyspnea  Inpatient Medications    Scheduled Meds:   stroke: mapping our early stages of recovery book   Does not apply Once   aspirin EC  81 mg Oral Daily   clopidogrel  75 mg Oral Daily   enoxaparin (LOVENOX) injection  40 mg Subcutaneous Q24H   metoprolol tartrate  12.5 mg Oral BID   Continuous Infusions:  PRN Meds: acetaminophen   Vital Signs    Vitals:   01/31/21 0005 01/31/21 0300 01/31/21 0500 01/31/21 0919  BP: 125/84  128/86 (!) 148/90  Pulse: 75 70 71 91  Resp: 16  16   Temp: 98.6 F (37 C)  97.8 F (36.6 C)   TempSrc: Oral  Oral   SpO2: 97% 96% 97%   Weight:      Height:        Intake/Output Summary (Last 24 hours) at 01/31/2021 1012 Last data filed at 01/31/2021 0400 Gross per 24 hour  Intake 240 ml  Output 1200 ml  Net -960 ml   Last 3 Weights 01/30/2021 10/14/2020 10/15/2019  Weight (lbs) 163 lb 9.3 oz 169 lb 166 lb 3.2 oz  Weight (kg) 74.2 kg 76.658 kg 75.388 kg      Telemetry    A. fib, rate 60s to 80s- Personally Reviewed  ECG    No new EKG- Personally Reviewed  Physical Exam   GEN: No acute distress.   Neck: No JVD Cardiac: Irregular, normal rate no murmurs, rubs, or gallops.  Respiratory: Clear to auscultation bilaterally. GI: Soft, nontender, non-distended  MS: Asymmetric lower extremity edema, right greater than left Neuro:  Nonfocal  Psych: Normal affect   Labs    High Sensitivity Troponin:   Recent Labs  Lab 01/30/21 1216 01/30/21 1413  TROPONINIHS 7 8      Chemistry Recent Labs  Lab 01/30/21 1216 01/31/21 0603  NA 139 139  K 4.0 4.5  CL 108 106  CO2 20* 27  GLUCOSE 97 101*  BUN 18 13  CREATININE 1.53* 1.33*  CALCIUM 8.7* 9.0  GFRNONAA 45* 53*  ANIONGAP 11 6      Hematology Recent Labs  Lab 01/30/21 1216  WBC 5.9  RBC 4.52  HGB 14.3  HCT 44.5  MCV 98.5  MCH 31.6  MCHC 32.1  RDW 13.5  PLT 210    BNPNo results for input(s): BNP, PROBNP in the last 168 hours.   DDimer No results for input(s): DDIMER in the last 168 hours.   Radiology    DG Chest 2 View  Result Date: 01/30/2021 CLINICAL DATA:  Patient awoke with extreme lethargy and dizziness. EXAM: CHEST - 2 VIEW COMPARISON:  July 12, 2015 FINDINGS: Trachea is midline. Cardiomediastinal contours and hilar structures are normal. Post median sternotomy for CABG. Lungs are clear.  No effusion. On limited assessment no acute skeletal process. IMPRESSION: No acute cardiopulmonary disease. Electronically Signed   By: Donzetta Kohut M.D.   On: 01/30/2021 13:23   CT HEAD WO CONTRAST  Result Date: 01/30/2021 CLINICAL DATA:  Question TIA in an 85 year old male. EXAM: CT HEAD WITHOUT CONTRAST TECHNIQUE: Contiguous axial images were obtained from the base of the skull through the vertex without intravenous contrast. COMPARISON:  September 20, 2019 FINDINGS: Brain: No evidence of  acute infarction, hemorrhage, hydrocephalus, extra-axial collection or mass lesion/mass effect. Signs of atrophy and chronic microvascular ischemic change as before. Vascular: No hyperdense vessel or unexpected calcification. Skull: Normal. Negative for fracture or focal lesion. Sinuses/Orbits: Chronic LEFT maxillary, ethmoid and frontal sinusitis similar to previous imaging. Over orbits are grossly normal. Other: None IMPRESSION: 1. No acute intracranial pathology. 2. Signs of atrophy and chronic microvascular ischemic change as before. 3. Chronic LEFT maxillary, ethmoid and frontal sinusitis similar to previous imaging. Electronically Signed   By: Donzetta Kohut M.D.   On: 01/30/2021 14:28   MR ANGIO HEAD WO CONTRAST  Result Date: 01/30/2021 CLINICAL DATA:  Initial evaluation for acute TIA, dizziness. EXAM: MRI HEAD WITHOUT  CONTRAST MRA HEAD WITHOUT CONTRAST TECHNIQUE: Multiplanar, multi-echo pulse sequences of the brain and surrounding structures were acquired without intravenous contrast. Angiographic images of the Circle of Willis were acquired using MRA technique without intravenous contrast. COMPARISON: No pertinent prior exam. COMPARISON:  Prior CT from earlier same day. FINDINGS: MRI HEAD FINDINGS Brain: Diffuse prominence of the CSF containing spaces compatible generalized age-related cerebral atrophy. No significant cerebral white matter disease for age. No abnormal foci of restricted diffusion to suggest acute or subacute ischemia. Gray-white matter differentiation maintained. No encephalomalacia to suggest chronic cortical infarction. No acute intracranial hemorrhage. Single punctate chronic microhemorrhage noted within the left cerebellum, of doubtful significance in isolation. No mass lesion, midline shift or mass effect. Mild ventricular prominence related global parenchymal volume loss without hydrocephalus. No extra-axial fluid collection. Pituitary gland suprasellar region normal. Midline structures intact. Vascular: Major intracranial vascular flow voids are maintained. Skull and upper cervical spine: Craniocervical junction within normal limits. Bone marrow signal intensity normal. No scalp soft tissue abnormality. Sinuses/Orbits: Globes and orbital soft tissues within normal limits. Extensive left-sided chronic paranasal sinusitis. Chronic appearing left mastoid effusion. Visualized nasopharynx within normal limits. Other: None. MRA HEAD FINDINGS Anterior circulation: Visualized distal cervical segments of the internal carotid arteries are patent with antegrade flow. Petrous, cavernous, and supraclinoid segments patent without stenosis or other abnormality. A1 segments patent bilaterally. Normal anterior communicating artery complex. Anterior cerebral arteries patent to their distal aspects without stenosis. Normal  in stenosis or occlusion. Normal MCA bifurcations. Distal MCA branches well perfused and symmetric. Posterior circulation: Both V4 segments patent to the vertebrobasilar junction without stenosis. Both PICA origins patent and normal. Basilar patent to its distal aspect without stenosis. Superior cerebellar arteries patent bilaterally. Left PCA supplied via the basilar. Predominant fetal type origin of the right PCA. Both PCAs well perfused to their distal aspects without stenosis. Anatomic variants: Fetal type origin of the right PCA.  No aneurysm. IMPRESSION: MRI HEAD IMPRESSION: 1. Negative brain MRI for age. No acute intracranial abnormality identified. 2. Extensive left-sided chronic paranasal sinusitis. MRA HEAD IMPRESSION: Negative intracranial MRA. No large vessel occlusion, hemodynamically significant stenosis or other acute vascular abnormality. Electronically Signed   By: Rise Mu M.D.   On: 01/30/2021 20:13   MR BRAIN WO CONTRAST  Result Date: 01/30/2021 CLINICAL DATA:  Initial evaluation for acute TIA, dizziness. EXAM: MRI HEAD WITHOUT CONTRAST MRA HEAD WITHOUT CONTRAST TECHNIQUE: Multiplanar, multi-echo pulse sequences of the brain and surrounding structures were acquired without intravenous contrast. Angiographic images of the Circle of Willis were acquired using MRA technique without intravenous contrast. COMPARISON: No pertinent prior exam. COMPARISON:  Prior CT from earlier same day. FINDINGS: MRI HEAD FINDINGS Brain: Diffuse prominence of the CSF containing spaces compatible generalized age-related cerebral atrophy. No significant cerebral white matter  disease for age. No abnormal foci of restricted diffusion to suggest acute or subacute ischemia. Gray-white matter differentiation maintained. No encephalomalacia to suggest chronic cortical infarction. No acute intracranial hemorrhage. Single punctate chronic microhemorrhage noted within the left cerebellum, of doubtful significance  in isolation. No mass lesion, midline shift or mass effect. Mild ventricular prominence related global parenchymal volume loss without hydrocephalus. No extra-axial fluid collection. Pituitary gland suprasellar region normal. Midline structures intact. Vascular: Major intracranial vascular flow voids are maintained. Skull and upper cervical spine: Craniocervical junction within normal limits. Bone marrow signal intensity normal. No scalp soft tissue abnormality. Sinuses/Orbits: Globes and orbital soft tissues within normal limits. Extensive left-sided chronic paranasal sinusitis. Chronic appearing left mastoid effusion. Visualized nasopharynx within normal limits. Other: None. MRA HEAD FINDINGS Anterior circulation: Visualized distal cervical segments of the internal carotid arteries are patent with antegrade flow. Petrous, cavernous, and supraclinoid segments patent without stenosis or other abnormality. A1 segments patent bilaterally. Normal anterior communicating artery complex. Anterior cerebral arteries patent to their distal aspects without stenosis. Normal in stenosis or occlusion. Normal MCA bifurcations. Distal MCA branches well perfused and symmetric. Posterior circulation: Both V4 segments patent to the vertebrobasilar junction without stenosis. Both PICA origins patent and normal. Basilar patent to its distal aspect without stenosis. Superior cerebellar arteries patent bilaterally. Left PCA supplied via the basilar. Predominant fetal type origin of the right PCA. Both PCAs well perfused to their distal aspects without stenosis. Anatomic variants: Fetal type origin of the right PCA.  No aneurysm. IMPRESSION: MRI HEAD IMPRESSION: 1. Negative brain MRI for age. No acute intracranial abnormality identified. 2. Extensive left-sided chronic paranasal sinusitis. MRA HEAD IMPRESSION: Negative intracranial MRA. No large vessel occlusion, hemodynamically significant stenosis or other acute vascular abnormality.  Electronically Signed   By: Rise Mu M.D.   On: 01/30/2021 20:13   VAS US CAROTID  Result Date: 01/31/2021 Carotid Arterial Duplex Study Patient Name:  SIVAN QUAST  Date of Exam:   01/31/2021 Medical Rec #: [id number]        Accession #:    [id number] Date of Birth: [birth year]         Patient Gender: M Patient Age:   85Y Exam Location:  [facility] Procedure:      VAS US CAROTID Referring Phys: [id number] DESIREE METZGER-CIHELKA --------------------------------------------------------------------------------  Indications:       TIA. Risk Factors:      Hypertension, hyperlipidemia, coronary artery disease. Comparison Study:  No prior study Performing Technologist: Gertie Fey MHA, RDMS, RVT, RDCS  Examination Guidelines: A complete evaluation includes B-mode imaging, spectral Doppler, color Doppler, and power Doppler as needed of all accessible portions of each vessel. Bilateral testing is considered an integral part of a complete examination. Limited examinations for reoccurring indications may be performed as noted.  Right Carotid Findings: +----------+--------+--------+--------+-----------------------+--------+           PSV cm/sEDV cm/sStenosisPlaque Description     Comments +----------+--------+--------+--------+-----------------------+--------+ CCA Prox  57      13                                              +----------+--------+--------+--------+-----------------------+--------+ CCA Distal62      21                                              +----------+--------+--------+--------+-----------------------+--------+  ICA Prox  72      29              smooth and heterogenous         +----------+--------+--------+--------+-----------------------+--------+ ICA Distal75      27                                              +----------+--------+--------+--------+-----------------------+--------+ ECA       53      7                                                +----------+--------+--------+--------+-----------------------+--------+ +----------+--------+-------+----------------+-------------------+           PSV cm/sEDV cmsDescribe        Arm Pressure (mmHG) +----------+--------+-------+----------------+-------------------+ [id number]             Multiphasic, WNL                    +----------+--------+-------+----------------+-------------------+ +---------+--------+--+--------+-+---------+ VertebralPSV cm/s33EDV cm/s7Antegrade +---------+--------+--+--------+-+---------+  Left Carotid Findings: +----------+-------+-------+--------+---------------------------------+--------+           PSV    EDV    StenosisPlaque Description               Comments           cm/s   cm/s                                                     +----------+-------+-------+--------+---------------------------------+--------+ CCA Prox  64     13                                                       +----------+-------+-------+--------+---------------------------------+--------+ CCA Distal62     16             smooth and homogeneous                    +----------+-------+-------+--------+---------------------------------+--------+ ICA Prox  151    42     40-59%  heterogenous, irregular and                                               calcific                                  +----------+-------+-------+--------+---------------------------------+--------+ ICA Mid   68     15                                                       +----------+-------+-------+--------+---------------------------------+--------+ ICA Distal86     30                                                       +----------+-------+-------+--------+---------------------------------+--------+  ECA       42     8              smooth and homogeneous                     +----------+-------+-------+--------+---------------------------------+--------+ +----------+--------+--------+----------------+-------------------+           PSV cm/sEDV cm/sDescribe        Arm Pressure (mmHG) +----------+--------+--------+----------------+-------------------+ [id number]              Multiphasic, WNL                    +----------+--------+--------+----------------+-------------------+ +---------+--------+--+--------+--+---------+ VertebralPSV cm/s44EDV cm/s13Antegrade +---------+--------+--+--------+--+---------+   Summary: Right Carotid: Velocities in the right ICA are consistent with a 1-39% stenosis. Left Carotid: Velocities in the left ICA are consistent with a 40-59% stenosis. Vertebrals:  Bilateral vertebral arteries demonstrate antegrade flow. Subclavians: Normal flow hemodynamics were seen in bilateral subclavian              arteries. *See table(s) above for measurements and observations.     Preliminary     Cardiac Studies   Echo 11/06/20:  1. Left ventricular ejection fraction, by estimation, is 60 to 65%. The  left ventricle has normal function. The left ventricle has no regional  wall motion abnormalities. Left ventricular diastolic parameters were  normal.   2. Right ventricular systolic function is normal. The right ventricular  size is normal. There is normal pulmonary artery systolic pressure. The  estimated right ventricular systolic pressure is 26.6 mmHg.   3. The mitral valve is grossly normal. Trivial mitral valve  regurgitation. No evidence of mitral stenosis.   4. The aortic valve is tricuspid. Aortic valve regurgitation is trivial.  No aortic stenosis is present.   5. The inferior vena cava is normal in size with greater than 50%  respiratory variability, suggesting right atrial pressure of 3 mmHg.   Patient Profile     85 y.o. male with a hx of CAD, status post CABG and multiple PCI who is being seen 01/30/2021 for the evaluation of  AF  Assessment & Plan    New onset atrial fibrillation: The patient developed new onset transient 30 minute episode of lightheadedness and gait abnormality and was found to be in atrial fibrillation.  ECG confirms atrial fibrillation with rate 80s on admission.  CHA2DS2-VASc score is a 4  -Recommend starting Eliquis 5 mg BID.  No recent coronary stenting, to avoid triple therapy would recommend discontinuing Plavix and continuing aspirin 81 mg daily.  Would plan Eliquis plus aspirin moving forward -Rates appear well controlled, continue metoprolol 12.5 mg twice daily   ?TIA: Brain MRI negative for CVA.  Neurology following.  If okay with neurology would change  to Eliquis plus aspirin as above.   CAD: Status post CABG revascularization surgery with and has multiple PCI's, last PCI in 2014.  At his last catheterization only one of his 4 bypass grafts were patent.  He has been on long-term Plavix his multiple stents and due to easy bruisability was taken off aspirin.  Remotely he was taken off beta-blockers due to bradycardia.  He does not have any anginal symptomatology.  Echo Doppler study in March 2022 showed an EF of 60 to 65%.  With his last stent being placed in 2016, plan to stop plavix and start Eliquis + ASA as above   Hyperlipidemia: He apparently has been intolerant to statin.  When asked about Zetia he believes  he may have tried this.  With his CAD and elevated lipids he should strongly consider PCSK9 inhibition with Repatha or Praluent.   CKD: Creatinine 1.33, GFR 53 today   LE edema: Asymmetric edema, right greater than left.  Will check lower extremity duplex to rule out DVT   For questions or updates, please contact [facility] Please consult [email or web address] for contact info under        Signed, Little Ishikawa, MD  01/31/2021, 10:12 AM

## 2021-01-31 NOTE — Care Management Obs Status (Signed)
MEDICARE OBSERVATION STATUS NOTIFICATION   Patient Details  Name: Steve Gordon MRN: [id number] Date of Birth: January 14, 1936   Medicare Observation Status Notification Given:  Yes    Gala Lewandowsky, RN 01/31/2021, 3:07 PM

## 2021-01-31 NOTE — Progress Notes (Signed)
Carotid artery duplex completed. Refer to "CV Proc" under chart review to view preliminary results.  01/31/2021 9:44 AM Eula Fried., MHA, RVT, RDCS, RDMS

## 2021-01-31 NOTE — Discharge Instructions (Addendum)
You were admitted with dizziness. All the brain imaging was normal. Your heart rate was also abnormal. We will start you on a medication below to prevent clots in the heart and stroke. Please see more info on this below.  Please follow up with stroke doctors in 4-6 weeks.  Information on my medicine - ELIQUIS (apixaban)  This medication education was reviewed with me or my healthcare representative as part of my discharge preparation.  The pharmacist that spoke with me during my hospital stay was:  Yujing Z  Steenwyk,, RPH  Why was Eliquis prescribed for you? Eliquis was prescribed for you to reduce the risk of a blood clot forming that can cause a stroke if you have a medical condition called atrial fibrillation (a type of irregular heartbeat).  What do You need to know about Eliquis ? Take your Eliquis TWICE DAILY - one tablet in the morning and one tablet in the evening with or without food. If you have difficulty swallowing the tablet whole please discuss with your pharmacist how to take the medication safely.  Take Eliquis exactly as prescribed by your doctor and DO NOT stop taking Eliquis without talking to the doctor who prescribed the medication.  Stopping may increase your risk of developing a stroke.  Refill your prescription before you run out.  After discharge, you should have regular check-up appointments with your healthcare provider that is prescribing your Eliquis.  In the future your dose may need to be changed if your kidney function or weight changes by a significant amount or as you get older.  What do you do if you miss a dose? If you miss a dose, take it as soon as you remember on the same day and resume taking twice daily.  Do not take more than one dose of ELIQUIS at the same time to make up a missed dose.  Important Safety Information A possible side effect of Eliquis is bleeding. You should call your healthcare provider right away if you experience any of the  following: Bleeding from an injury or your nose that does not stop. Unusual colored urine (red or dark brown) or unusual colored stools (red or black). Unusual bruising for unknown reasons. A serious fall or if you hit your head (even if there is no bleeding).  Some medicines may interact with Eliquis and might increase your risk of bleeding or clotting while on Eliquis. To help avoid this, consult your healthcare provider or pharmacist prior to using any new prescription or non-prescription medications, including herbals, vitamins, non-steroidal anti-inflammatory drugs (NSAIDs) and supplements.  This website has more information on Eliquis (apixaban): http://www.eliquis.com/eliquis/home

## 2021-01-31 NOTE — Hospital Course (Addendum)
EZEKIAL ARNS is a 85 y.o. male presenting with dizziness and new onset atrial fibrillation. PMH is significant for CAD (s/p 4V CABG 2003), HLD (with statin intolerance), HTN, CKD, mild mitral regurgitation.   Concern for TIA Patient admitted with 1 episode of dizziness following new onset A. Fib.  His symptoms resolved prior to arriving at the hospital CT head, MRI brain and MRA brain: chronic sinusitis, signs of atrophy and chronic microvascular ischemic changes. Carotid Doppler  LICA 40-59%, RICA <39%.  Patient was seen by neurology and started on aspirin and Plavix.  Risk stratification labs: LDL 152, A1c 5.3. Started on 40mg  Atorvastatin.  New onset A. Fib Troponin 7>8, EKG showing atrial fibrillation.  CHA2DS2-VASc 3. score cardiology were consulted who recommended Eliquis 5 mg twice daily continue aspirin 81mg .  Also recommended metoprolol 12.5mg  BID. Echo showed 55 to 60%, mild concentric LVH,right ventricular size is mildly enlarged.  Hypertension Amlodipine held to allow for permissive hypertension.

## 2021-01-31 NOTE — Progress Notes (Signed)
Family Medicine Teaching Service Daily Progress Note Intern Pager: [phone]  Patient name: Steve Gordon Medical record number: [id number] Date of birth: [birth year] Age: 85 y.o. Gender: male  Primary Care Provider: Moses Manners, MD Consultants: Neuro  Code Status: Full   Pt Overview and Major Events to Date:  Steve Gordon is a 85 y.o. male presenting with dizziness and new onset atrial fibrillation. PMH is significant for CAD (s/p 4V CABG 2003), HLD (with statin intolerance), HTN, CKD, mild mitral regurgitation.  New onset atrial fibrillation  Hx of CAD (s/p 4V CABG 2003) -Cardiology consulted, appreciate recs-start eliquis 5mg  BID with ASA, stop plavix  Concern for TIA Thus far neuroimaging has been normal. No evidence of CVA  -Neuro following; appreciate recs -OP follow up with neurology -Discharge today    CKD Stage IIIa Creatnine 1.33 today, at baseline  -Continue to monitor with BMP   HTN BP 128/86 Home medications include Amlodipine 10mg  daily -Hold Amlodipine for permissive hypertension  FEN/GI: heart healthy diet   PPx: SCDs Dispo:Home today.  Subjective:  No acute concerns overnight. Pt would like to go home today.   Objective: Temp:  [97.8 F (36.6 C)-98.6 F (37 C)] 97.8 F (36.6 C) (06/25 0500) Pulse Rate:  [70-131] 91 (06/25 0919) Resp:  [12-24] 16 (06/25 0500) BP: (125-171)/(79-160) 148/90 (06/25 0919) SpO2:  [95 %-100 %] 97 % (06/25 0500) Weight:  [74.2 kg] 74.2 kg (06/24 2039)  Physical Exam: General: Alert, no acute distress, pleasant  Cardio: Normal S1 and S2, irregular  Pulm: CTAB, normal work of breathing Abdomen: Bowel sounds normal. Abdomen soft and non-tender.  Extremities: No peripheral edema.  Neuro: Cranial nerves grossly intact, PEERLA, 5/5 strength UE and LE, normal sensation throughout   Laboratory: Recent Labs  Lab 01/30/21 1216  WBC 5.9  HGB 14.3  HCT 44.5  PLT 210   Recent Labs  Lab 01/30/21 1216  01/31/21 0603  NA 139 139  K 4.0 4.5  CL 108 106  CO2 20* 27  BUN 18 13  CREATININE 1.53* 1.33*  CALCIUM 8.7* 9.0  GLUCOSE 97 101*     Imaging/Diagnostic Tests: DG Chest 2 View  Result Date: 01/30/2021 CLINICAL DATA:  Patient awoke with extreme lethargy and dizziness. EXAM: CHEST - 2 VIEW COMPARISON:  July 12, 2015 FINDINGS: Trachea is midline. Cardiomediastinal contours and hilar structures are normal. Post median sternotomy for CABG. Lungs are clear.  No effusion. On limited assessment no acute skeletal process. IMPRESSION: No acute cardiopulmonary disease. Electronically Signed   By: 02/01/2021 M.D.   On: 01/30/2021 13:23   CT HEAD WO CONTRAST  Result Date: 01/30/2021 CLINICAL DATA:  Question TIA in an 85 year old male. EXAM: CT HEAD WITHOUT CONTRAST TECHNIQUE: Contiguous axial images were obtained from the base of the skull through the vertex without intravenous contrast. COMPARISON:  September 20, 2019 FINDINGS: Brain: No evidence of acute infarction, hemorrhage, hydrocephalus, extra-axial collection or mass lesion/mass effect. Signs of atrophy and chronic microvascular ischemic change as before. Vascular: No hyperdense vessel or unexpected calcification. Skull: Normal. Negative for fracture or focal lesion. Sinuses/Orbits: Chronic LEFT maxillary, ethmoid and frontal sinusitis similar to previous imaging. Over orbits are grossly normal. Other: None IMPRESSION: 1. No acute intracranial pathology. 2. Signs of atrophy and chronic microvascular ischemic change as before. 3. Chronic LEFT maxillary, ethmoid and frontal sinusitis similar to previous imaging. Electronically Signed   By: 91 M.D.   On: 01/30/2021 14:28   MR ANGIO HEAD WO  CONTRAST  Result Date: 01/30/2021 CLINICAL DATA:  Initial evaluation for acute TIA, dizziness. EXAM: MRI HEAD WITHOUT CONTRAST MRA HEAD WITHOUT CONTRAST TECHNIQUE: Multiplanar, multi-echo pulse sequences of the brain and surrounding structures  were acquired without intravenous contrast. Angiographic images of the Circle of Willis were acquired using MRA technique without intravenous contrast. COMPARISON: No pertinent prior exam. COMPARISON:  Prior CT from earlier same day. FINDINGS: MRI HEAD FINDINGS Brain: Diffuse prominence of the CSF containing spaces compatible generalized age-related cerebral atrophy. No significant cerebral white matter disease for age. No abnormal foci of restricted diffusion to suggest acute or subacute ischemia. Gray-white matter differentiation maintained. No encephalomalacia to suggest chronic cortical infarction. No acute intracranial hemorrhage. Single punctate chronic microhemorrhage noted within the left cerebellum, of doubtful significance in isolation. No mass lesion, midline shift or mass effect. Mild ventricular prominence related global parenchymal volume loss without hydrocephalus. No extra-axial fluid collection. Pituitary gland suprasellar region normal. Midline structures intact. Vascular: Major intracranial vascular flow voids are maintained. Skull and upper cervical spine: Craniocervical junction within normal limits. Bone marrow signal intensity normal. No scalp soft tissue abnormality. Sinuses/Orbits: Globes and orbital soft tissues within normal limits. Extensive left-sided chronic paranasal sinusitis. Chronic appearing left mastoid effusion. Visualized nasopharynx within normal limits. Other: None. MRA HEAD FINDINGS Anterior circulation: Visualized distal cervical segments of the internal carotid arteries are patent with antegrade flow. Petrous, cavernous, and supraclinoid segments patent without stenosis or other abnormality. A1 segments patent bilaterally. Normal anterior communicating artery complex. Anterior cerebral arteries patent to their distal aspects without stenosis. Normal in stenosis or occlusion. Normal MCA bifurcations. Distal MCA branches well perfused and symmetric. Posterior circulation:  Both V4 segments patent to the vertebrobasilar junction without stenosis. Both PICA origins patent and normal. Basilar patent to its distal aspect without stenosis. Superior cerebellar arteries patent bilaterally. Left PCA supplied via the basilar. Predominant fetal type origin of the right PCA. Both PCAs well perfused to their distal aspects without stenosis. Anatomic variants: Fetal type origin of the right PCA.  No aneurysm. IMPRESSION: MRI HEAD IMPRESSION: 1. Negative brain MRI for age. No acute intracranial abnormality identified. 2. Extensive left-sided chronic paranasal sinusitis. MRA HEAD IMPRESSION: Negative intracranial MRA. No large vessel occlusion, hemodynamically significant stenosis or other acute vascular abnormality. Electronically Signed   By: Rise MuBenjamin  McClintock M.D.   On: 01/30/2021 20:13   MR BRAIN WO CONTRAST  Result Date: 01/30/2021 CLINICAL DATA:  Initial evaluation for acute TIA, dizziness. EXAM: MRI HEAD WITHOUT CONTRAST MRA HEAD WITHOUT CONTRAST TECHNIQUE: Multiplanar, multi-echo pulse sequences of the brain and surrounding structures were acquired without intravenous contrast. Angiographic images of the Circle of Willis were acquired using MRA technique without intravenous contrast. COMPARISON: No pertinent prior exam. COMPARISON:  Prior CT from earlier same day. FINDINGS: MRI HEAD FINDINGS Brain: Diffuse prominence of the CSF containing spaces compatible generalized age-related cerebral atrophy. No significant cerebral white matter disease for age. No abnormal foci of restricted diffusion to suggest acute or subacute ischemia. Gray-white matter differentiation maintained. No encephalomalacia to suggest chronic cortical infarction. No acute intracranial hemorrhage. Single punctate chronic microhemorrhage noted within the left cerebellum, of doubtful significance in isolation. No mass lesion, midline shift or mass effect. Mild ventricular prominence related global parenchymal volume  loss without hydrocephalus. No extra-axial fluid collection. Pituitary gland suprasellar region normal. Midline structures intact. Vascular: Major intracranial vascular flow voids are maintained. Skull and upper cervical spine: Craniocervical junction within normal limits. Bone marrow signal intensity normal. No scalp soft tissue abnormality.  Sinuses/Orbits: Globes and orbital soft tissues within normal limits. Extensive left-sided chronic paranasal sinusitis. Chronic appearing left mastoid effusion. Visualized nasopharynx within normal limits. Other: None. MRA HEAD FINDINGS Anterior circulation: Visualized distal cervical segments of the internal carotid arteries are patent with antegrade flow. Petrous, cavernous, and supraclinoid segments patent without stenosis or other abnormality. A1 segments patent bilaterally. Normal anterior communicating artery complex. Anterior cerebral arteries patent to their distal aspects without stenosis. Normal in stenosis or occlusion. Normal MCA bifurcations. Distal MCA branches well perfused and symmetric. Posterior circulation: Both V4 segments patent to the vertebrobasilar junction without stenosis. Both PICA origins patent and normal. Basilar patent to its distal aspect without stenosis. Superior cerebellar arteries patent bilaterally. Left PCA supplied via the basilar. Predominant fetal type origin of the right PCA. Both PCAs well perfused to their distal aspects without stenosis. Anatomic variants: Fetal type origin of the right PCA.  No aneurysm. IMPRESSION: MRI HEAD IMPRESSION: 1. Negative brain MRI for age. No acute intracranial abnormality identified. 2. Extensive left-sided chronic paranasal sinusitis. MRA HEAD IMPRESSION: Negative intracranial MRA. No large vessel occlusion, hemodynamically significant stenosis or other acute vascular abnormality. Electronically Signed   By: Rise Mu M.D.   On: 01/30/2021 20:13   VAS US CAROTID  Result Date:  01/31/2021 Carotid Arterial Duplex Study Patient Name:  LAMIN CHANDLEY  Date of Exam:   01/31/2021 Medical Rec #: [id number]        Accession #:    [id number] Date of Birth: February 14, 1936         Patient Gender: M Patient Age:   85Y Exam Location:  [facility] Procedure:      VAS US CAROTID Referring Phys: [id number] DESIREE METZGER-CIHELKA --------------------------------------------------------------------------------  Indications:       TIA. Risk Factors:      Hypertension, hyperlipidemia, coronary artery disease. Comparison Study:  No prior study Performing Technologist: Gertie Fey MHA, RDMS, RVT, RDCS  Examination Guidelines: A complete evaluation includes B-mode imaging, spectral Doppler, color Doppler, and power Doppler as needed of all accessible portions of each vessel. Bilateral testing is considered an integral part of a complete examination. Limited examinations for reoccurring indications may be performed as noted.  Right Carotid Findings: +----------+--------+--------+--------+-----------------------+--------+           PSV cm/sEDV cm/sStenosisPlaque Description     Comments +----------+--------+--------+--------+-----------------------+--------+ CCA Prox  57      13                                              +----------+--------+--------+--------+-----------------------+--------+ CCA Distal62      21                                              +----------+--------+--------+--------+-----------------------+--------+ ICA Prox  72      29              smooth and heterogenous         +----------+--------+--------+--------+-----------------------+--------+ ICA Distal75      27                                              +----------+--------+--------+--------+-----------------------+--------+  ECA       53      7                                               +----------+--------+--------+--------+-----------------------+--------+  +----------+--------+-------+----------------+-------------------+           PSV cm/sEDV cmsDescribe        Arm Pressure (mmHG) +----------+--------+-------+----------------+-------------------+ [id number]             Multiphasic, WNL                    +----------+--------+-------+----------------+-------------------+ +---------+--------+--+--------+-+---------+ VertebralPSV cm/s33EDV cm/s7Antegrade +---------+--------+--+--------+-+---------+  Left Carotid Findings: +----------+-------+-------+--------+---------------------------------+--------+           PSV    EDV    StenosisPlaque Description               Comments           cm/s   cm/s                                                     +----------+-------+-------+--------+---------------------------------+--------+ CCA Prox  64     13                                                       +----------+-------+-------+--------+---------------------------------+--------+ CCA Distal62     16             smooth and homogeneous                    +----------+-------+-------+--------+---------------------------------+--------+ ICA Prox  151    42     40-59%  heterogenous, irregular and                                               calcific                                  +----------+-------+-------+--------+---------------------------------+--------+ ICA Mid   68     15                                                       +----------+-------+-------+--------+---------------------------------+--------+ ICA Distal86     30                                                       +----------+-------+-------+--------+---------------------------------+--------+ ECA       42     8              smooth and homogeneous                    +----------+-------+-------+--------+---------------------------------+--------+ +----------+--------+--------+----------------+-------------------+  PSV cm/sEDV cm/sDescribe        Arm Pressure (mmHG) +----------+--------+--------+----------------+-------------------+ [id number]              Multiphasic, WNL                    +----------+--------+--------+----------------+-------------------+ +---------+--------+--+--------+--+---------+ VertebralPSV cm/s44EDV cm/s13Antegrade +---------+--------+--+--------+--+---------+   Summary: Right Carotid: Velocities in the right ICA are consistent with a 1-39% stenosis. Left Carotid: Velocities in the left ICA are consistent with a 40-59% stenosis. Vertebrals:  Bilateral vertebral arteries demonstrate antegrade flow. Subclavians: Normal flow hemodynamics were seen in bilateral subclavian              arteries. *See table(s) above for measurements and observations.     Preliminary      Towanda Octave, MD 01/31/2021, 1:12 PM PGY-2, [facility] Family Medicine FPTS Intern pager: [phone], text pages welcome

## 2021-01-31 NOTE — Care Management CC44 (Signed)
Condition Code 44 Documentation Completed  Patient Details  Name: Steve Gordon MRN: [id number] Date of Birth: 1935-09-26   Condition Code 44 given:  Yes Patient signature on Condition Code 44 notice:  Yes Documentation of 2 MD's agreement:  Yes Code 44 added to claim:  Yes    Gala Lewandowsky, RN 01/31/2021, 3:07 PM

## 2021-01-31 NOTE — Plan of Care (Signed)
  Problem: Activity: Goal: Ability to tolerate increased activity will improve Outcome: Progressing   

## 2021-01-31 NOTE — Evaluation (Signed)
Physical Therapy Evaluation Patient Details Name: Steve Gordon MRN: [id number] DOB: May 12, 1936 Today's Date: 01/31/2021   History of Present Illness  85 y.o. male admitted on 01/30/21 for dizziness and new onset A-fib, concern for TIA (neurology consulted and imaging is normal).  Pt with significant PMH of CAD, HTN, L ankle fx surgery, CKD IIIa, and CABG.  Clinical Impression  Pt is close to his reported baseline, some repitition of commands, but this may be because he is HOH not true cognitive deficits.  Only one small LOB when turning quickly, but otherwise looks good on his feet.  He is a very active 85 year old and had resolution of his symptoms. Pt is due to d/c today.  No follow up PT recommended.     Follow Up Recommendations No PT follow up    Equipment Recommendations  None recommended by PT    Recommendations for Other Services       Precautions / Restrictions Precautions Precautions: None      Mobility  Bed Mobility Overal bed mobility: Independent                  Transfers Overall transfer level: Independent                  Ambulation/Gait Ambulation/Gait assistance: Supervision   Assistive device: None Gait Pattern/deviations: Step-through pattern;Staggering left   Gait velocity interpretation: >2.62 ft/sec, indicative of community ambulatory General Gait Details: Only one stagger as I was testing his higher level gait and balance turning quickly.  Otherwise, good speed and normal pattern.  Pt says he feels back to his baseline.  He reports no falls in the past 6 mo.  Stairs            Wheelchair Mobility    Modified Rankin (Stroke Patients Only) Modified Rankin (Stroke Patients Only) Pre-Morbid Rankin Score: No symptoms Modified Rankin: No significant disability     Balance Overall balance assessment: Needs assistance Sitting-balance support: No upper extremity supported Sitting balance-Leahy Scale: Normal     Standing  balance support: No upper extremity supported Standing balance-Leahy Scale: Good               High level balance activites: Backward walking;Direction changes;Turns;Sudden stops High Level Balance Comments: only one LOB with quick turns             Pertinent Vitals/Pain Pain Assessment: No/denies pain    Home Living Family/patient expects to be discharged to:: Private residence Living Arrangements: Spouse/significant other Available Help at Discharge: Family;Available 24 hours/day Type of Home: House Home Access: Stairs to enter   [employer] of Steps: 2 Home Layout: One level   Additional Comments: pt also [place] at [facility] with "lots of stairs"--pt reports three stories.    Prior Function Level of Independence: Independent         Comments: drives, likes to fix up old cars, mows the lawn, generally very active and independent.     Hand Dominance   Dominant Hand: Right    Extremity/Trunk Assessment   Upper Extremity Assessment Upper Extremity Assessment: Defer to OT evaluation    Lower Extremity Assessment Lower Extremity Assessment: Overall WFL for tasks assessed    Cervical / Trunk Assessment Cervical / Trunk Assessment: Normal  Communication   Communication: HOH  Cognition Arousal/Alertness: Awake/alert Behavior During Therapy: WFL for tasks assessed/performed Overall Cognitive Status: Difficult to assess  General Comments: pt is HOH and often needed repetition of commands likely because he did not hear me the first time.  Generally, with repetition he was able to get it the second time.  Wife reports his cognition seems normal to her.      General Comments      Exercises     Assessment/Plan    PT Assessment Patent does not need any further PT services  PT Problem List         PT Treatment Interventions      PT Goals (Current goals can be found in the Care  Plan section)  Acute Rehab PT Goals PT Goal Formulation: All assessment and education complete, DC therapy    Frequency     Barriers to discharge        Co-evaluation               AM-PAC PT "6 Clicks" Mobility  Outcome Measure Help needed turning from your back to your side while in a flat bed without using bedrails?: None Help needed moving from lying on your back to sitting on the side of a flat bed without using bedrails?: None Help needed moving to and from a bed to a chair (including a wheelchair)?: None Help needed standing up from a chair using your arms (e.g., wheelchair or bedside chair)?: None Help needed to walk in hospital room?: A Little Help needed climbing 3-5 steps with a railing? : None 6 Click Score: 23    End of Session   Activity Tolerance: Patient tolerated treatment well Patient left: in bed;with call bell/phone within reach;with family/visitor present;Other (comment) (seated EOB, OT coming into room.) Nurse Communication: Mobility status PT Visit Diagnosis: Dizziness and giddiness (R42)    Time: 8101-7510 PT Time Calculation (min) (ACUTE ONLY): 22 min   Charges:   PT Evaluation $PT Eval Moderate Complexity: 1 Mod        Corinna Capra, PT, DPT  Acute Rehabilitation Ortho Tech Supervisor [phone] pager [phone]) [phone] office

## 2021-01-31 NOTE — Progress Notes (Signed)
Right lower extremity venous duplex completed. Refer to "CV Proc" under chart review to view preliminary results.  01/31/2021 3:13 PM Eula Fried., MHA, RVT, RDCS, RDMS

## 2021-01-31 NOTE — Evaluation (Signed)
Occupational Therapy Evaluation Patient Details Name: Steve Gordon MRN: [id number] DOB: 1936-02-04 Today's Date: 01/31/2021    History of Present Illness 85 y.o. male admitted on 01/30/21 for dizziness and new onset A-fib, concern for TIA (neurology consulted and imaging is normal).  Pt with significant PMH of CAD, HTN, L ankle fx surgery, CKD IIIa, and CABG.   Clinical Impression   Pt admitted for concerns listed above. 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 At the time of the evaluation, pt demonstrated continued independence, he got himself dressed with no assist prior to OT entry, then simulated all ADL's with no difficulties or safety concerns. Additionally pt vision was assessed functionally, as well as saccades, tracking, and peripheral vision were assessed, no concerns. Pt does not require OT services at this time and acute OT will sign off. Thanks for the referral.     Follow Up Recommendations  No OT follow up    Equipment Recommendations  None recommended by OT    Recommendations for Other Services       Precautions / Restrictions Precautions Precautions: None Restrictions Weight Bearing Restrictions: No      Mobility Bed Mobility Overal bed mobility: Independent                  Transfers Overall transfer level: Independent                    Balance Overall balance assessment: No apparent balance deficits (not formally assessed) Sitting-balance support: No upper extremity supported Sitting balance-Leahy Scale: Normal     Standing balance support: No upper extremity supported Standing balance-Leahy Scale: Good               High level balance activites: Backward walking;Direction changes;Turns;Sudden stops High Level Balance Comments: only one LOB with quick turns           ADL either performed or assessed with clinical judgement   ADL Overall ADL's :  Independent;At baseline                                       General ADL Comments: Pt able to complete at ADL's with no assist. Simulated in room, sitting and standing with no difficulties.     Vision Baseline Vision/History: No visual deficits Patient Visual Report: No change from baseline Vision Assessment?: No apparent visual deficits Additional Comments: Peripheral vision, tracking, and saccades all assessed and normal.     Perception Perception Perception Tested?: No   Praxis Praxis Praxis tested?: Not tested    Pertinent Vitals/Pain Pain Assessment: No/denies pain     Hand Dominance Right   Extremity/Trunk Assessment Upper Extremity Assessment Upper Extremity Assessment: Overall WFL for tasks assessed   Lower Extremity Assessment Lower Extremity Assessment: Defer to PT evaluation   Cervical / Trunk Assessment Cervical / Trunk Assessment: Normal   Communication Communication Communication: HOH   Cognition Arousal/Alertness: Awake/alert Behavior During Therapy: WFL for tasks assessed/performed Overall Cognitive Status: Within Functional Limits for tasks assessed                                 General Comments: pt is HOH and often needed repetition of commands likely because he did not hear me the first time.  Generally, with  repetition he was able to get it the second time.  Wife reports his cognition seems normal to her.   General Comments  VSS on RA    Exercises     Shoulder Instructions      Home Living Family/patient expects to be discharged to:: Private residence Living Arrangements: Spouse/significant other Available Help at Discharge: Family;Available 24 hours/day Type of Home: House Home Access: Stairs to enter [employer] of Steps: 2 Entrance Stairs-Rails: Can reach both Home Layout: One level     Bathroom Shower/Tub: Producer, television/film/video: Standard Bathroom Accessibility: Yes How  Accessible: Accessible via walker Home Equipment: Walker - 2 wheels;Cane - quad   Additional Comments: pt also has a [place] house at [place] [address] [place] with "lots of stairs"--pt reports three stories.      Prior Functioning/Environment Level of Independence: Independent        Comments: drives, likes to fix up old cars, mows the lawn, generally very active and independent.        OT Problem List: Decreased strength;Decreased activity tolerance;Decreased range of motion;Impaired balance (sitting and/or standing);Impaired sensation;Impaired vision/perception      OT Treatment/Interventions:      OT Goals(Current goals can be found in the care plan section) Acute Rehab OT Goals Patient Stated Goal: To go home OT Goal Formulation: All assessment and education complete, DC therapy Time For Goal Achievement: 01/31/21 Potential to Achieve Goals: Good  OT Frequency:     Barriers to D/C:            Co-evaluation              AM-PAC OT "6 Clicks" Daily Activity     Outcome Measure Help from another person eating meals?: None Help from another person taking care of personal grooming?: None Help from another person toileting, which includes using toliet, bedpan, or urinal?: None Help from another person bathing (including washing, rinsing, drying)?: None Help from another person to put on and taking off regular upper body clothing?: None Help from another person to put on and taking off regular lower body clothing?: None 6 Click Score: 24   End of Session Nurse Communication: Mobility status  Activity Tolerance: Patient tolerated treatment well Patient left: in bed;with call bell/phone within reach;with family/visitor present  OT Visit Diagnosis: Unsteadiness on feet (R26.81);Muscle weakness (generalized) (M62.81)                Time: 4332-9518 OT Time Calculation (min): 23 min Charges:  OT General Charges $OT Visit: 1 Visit OT Evaluation $OT Eval Low Complexity: 1 Low OT  Treatments $Self Care/Home Management : 8-22 mins  Charlee Squibb H., OTR/L Acute Rehabilitation  Brayley Mackowiak Elane Bing Plume 01/31/2021, 4:50 PM

## 2021-01-31 NOTE — Progress Notes (Addendum)
STROKE TEAM PROGRESS NOTE   INTERVAL HISTORY [name] is at the bedside.  No return of symptoms. Steve Gordon states he feels fine and wants to dc home. We discussed Afib and stroke risk and possibly starting Eliquis if cardiology agrees.   Vitals:   01/31/21 0000 01/31/21 0005 01/31/21 0300 01/31/21 0500  BP:  125/84  128/86  Pulse: 71 75 70 71  Resp: 15 16  16   Temp:  98.6 F (37 C)  97.8 F (36.6 C)  TempSrc:  Oral  Oral  SpO2: 97% 97% 96% 97%  Weight:      Height:       CBC:  Recent Labs  Lab 01/30/21 1216  WBC 5.9  HGB 14.3  HCT 44.5  MCV 98.5  PLT 210   Basic Metabolic Panel:  Recent Labs  Lab 01/30/21 1216 01/31/21 0603  NA 139 139  K 4.0 4.5  CL 108 106  CO2 20* 27  GLUCOSE 97 101*  BUN 18 13  CREATININE 1.53* 1.33*  CALCIUM 8.7* 9.0  MG  --  2.1   Lipid Panel: No results for input(s): CHOL, TRIG, HDL, CHOLHDL, VLDL, LDLCALC in the last 168 hours. HgbA1c:  Recent Labs  Lab 01/31/21 0603  HGBA1C 5.3   Urine Drug Screen:  Recent Labs  Lab 01/30/21 1643  LABOPIA NONE DETECTED  COCAINSCRNUR NONE DETECTED  LABBENZ NONE DETECTED  AMPHETMU NONE DETECTED  THCU NONE DETECTED  LABBARB NONE DETECTED    Alcohol Level No results for input(s): ETH in the last 168 hours.  IMAGING past 24 hours DG Chest 2 View  Result Date: 01/30/2021 CLINICAL DATA:  Patient awoke with extreme lethargy and dizziness. EXAM: CHEST - 2 VIEW COMPARISON:  July 12, 2015 FINDINGS: Trachea is midline. Cardiomediastinal contours and hilar structures are normal. Post median sternotomy for CABG. Lungs are clear.  No effusion. On limited assessment no acute skeletal process. IMPRESSION: No acute cardiopulmonary disease. Electronically Signed   By: July 14, 2015 M.D.   On: 01/30/2021 13:23   CT HEAD WO CONTRAST  Result Date: 01/30/2021 CLINICAL DATA:  Question TIA in an 85 year old male. EXAM: CT HEAD WITHOUT CONTRAST TECHNIQUE: Contiguous axial images were obtained from the base of the  skull through the vertex without intravenous contrast. COMPARISON:  September 20, 2019 FINDINGS: Brain: No evidence of acute infarction, hemorrhage, hydrocephalus, extra-axial collection or mass lesion/mass effect. Signs of atrophy and chronic microvascular ischemic change as before. Vascular: No hyperdense vessel or unexpected calcification. Skull: Normal. Negative for fracture or focal lesion. Sinuses/Orbits: Chronic LEFT maxillary, ethmoid and frontal sinusitis similar to previous imaging. Over orbits are grossly normal. Other: None IMPRESSION: 1. No acute intracranial pathology. 2. Signs of atrophy and chronic microvascular ischemic change as before. 3. Chronic LEFT maxillary, ethmoid and frontal sinusitis similar to previous imaging. Electronically Signed   By: September 22, 2019 M.D.   On: 01/30/2021 14:28   MR ANGIO HEAD WO CONTRAST  Result Date: 01/30/2021 CLINICAL DATA:  Initial evaluation for acute TIA, dizziness. EXAM: MRI HEAD WITHOUT CONTRAST MRA HEAD WITHOUT CONTRAST TECHNIQUE: Multiplanar, multi-echo pulse sequences of the brain and surrounding structures were acquired without intravenous contrast. Angiographic images of the Circle of Willis were acquired using MRA technique without intravenous contrast. COMPARISON: No pertinent prior exam. COMPARISON:  Prior CT from earlier same day. FINDINGS: MRI HEAD FINDINGS Brain: Diffuse prominence of the CSF containing spaces compatible generalized age-related cerebral atrophy. No significant cerebral white matter disease for age. No abnormal foci of  restricted diffusion to suggest acute or subacute ischemia. Gray-white matter differentiation maintained. No encephalomalacia to suggest chronic cortical infarction. No acute intracranial hemorrhage. Single punctate chronic microhemorrhage noted within the left cerebellum, of doubtful significance in isolation. No mass lesion, midline shift or mass effect. Mild ventricular prominence related global parenchymal  volume loss without hydrocephalus. No extra-axial fluid collection. Pituitary gland suprasellar region normal. Midline structures intact. Vascular: Major intracranial vascular flow voids are maintained. Skull and upper cervical spine: Craniocervical junction within normal limits. Bone marrow signal intensity normal. No scalp soft tissue abnormality. Sinuses/Orbits: Globes and orbital soft tissues within normal limits. Extensive left-sided chronic paranasal sinusitis. Chronic appearing left mastoid effusion. Visualized nasopharynx within normal limits. Other: None. MRA HEAD FINDINGS Anterior circulation: Visualized distal cervical segments of the internal carotid arteries are patent with antegrade flow. Petrous, cavernous, and supraclinoid segments patent without stenosis or other abnormality. A1 segments patent bilaterally. Normal anterior communicating artery complex. Anterior cerebral arteries patent to their distal aspects without stenosis. Normal in stenosis or occlusion. Normal MCA bifurcations. Distal MCA branches well perfused and symmetric. Posterior circulation: Both V4 segments patent to the vertebrobasilar junction without stenosis. Both PICA origins patent and normal. Basilar patent to its distal aspect without stenosis. Superior cerebellar arteries patent bilaterally. Left PCA supplied via the basilar. Predominant fetal type origin of the right PCA. Both PCAs well perfused to their distal aspects without stenosis. Anatomic variants: Fetal type origin of the right PCA.  No aneurysm. IMPRESSION: MRI HEAD IMPRESSION: 1. Negative brain MRI for age. No acute intracranial abnormality identified. 2. Extensive left-sided chronic paranasal sinusitis. MRA HEAD IMPRESSION: Negative intracranial MRA. No large vessel occlusion, hemodynamically significant stenosis or other acute vascular abnormality. Electronically Signed   By: Rise MuBenjamin  McClintock M.D.   On: 01/30/2021 20:13   MR BRAIN WO CONTRAST  Result Date:  01/30/2021 CLINICAL DATA:  Initial evaluation for acute TIA, dizziness. EXAM: MRI HEAD WITHOUT CONTRAST MRA HEAD WITHOUT CONTRAST TECHNIQUE: Multiplanar, multi-echo pulse sequences of the brain and surrounding structures were acquired without intravenous contrast. Angiographic images of the Circle of Willis were acquired using MRA technique without intravenous contrast. COMPARISON: No pertinent prior exam. COMPARISON:  Prior CT from earlier same day. FINDINGS: MRI HEAD FINDINGS Brain: Diffuse prominence of the CSF containing spaces compatible generalized age-related cerebral atrophy. No significant cerebral white matter disease for age. No abnormal foci of restricted diffusion to suggest acute or subacute ischemia. Gray-white matter differentiation maintained. No encephalomalacia to suggest chronic cortical infarction. No acute intracranial hemorrhage. Single punctate chronic microhemorrhage noted within the left cerebellum, of doubtful significance in isolation. No mass lesion, midline shift or mass effect. Mild ventricular prominence related global parenchymal volume loss without hydrocephalus. No extra-axial fluid collection. Pituitary gland suprasellar region normal. Midline structures intact. Vascular: Major intracranial vascular flow voids are maintained. Skull and upper cervical spine: Craniocervical junction within normal limits. Bone marrow signal intensity normal. No scalp soft tissue abnormality. Sinuses/Orbits: Globes and orbital soft tissues within normal limits. Extensive left-sided chronic paranasal sinusitis. Chronic appearing left mastoid effusion. Visualized nasopharynx within normal limits. Other: None. MRA HEAD FINDINGS Anterior circulation: Visualized distal cervical segments of the internal carotid arteries are patent with antegrade flow. Petrous, cavernous, and supraclinoid segments patent without stenosis or other abnormality. A1 segments patent bilaterally. Normal anterior communicating  artery complex. Anterior cerebral arteries patent to their distal aspects without stenosis. Normal in stenosis or occlusion. Normal MCA bifurcations. Distal MCA branches well perfused and symmetric. Posterior circulation: Both V4 segments patent  to the vertebrobasilar junction without stenosis. Both PICA origins patent and normal. Basilar patent to its distal aspect without stenosis. Superior cerebellar arteries patent bilaterally. Left PCA supplied via the basilar. Predominant fetal type origin of the right PCA. Both PCAs well perfused to their distal aspects without stenosis. Anatomic variants: Fetal type origin of the right PCA.  No aneurysm. IMPRESSION: MRI HEAD IMPRESSION: 1. Negative brain MRI for age. No acute intracranial abnormality identified. 2. Extensive left-sided chronic paranasal sinusitis. MRA HEAD IMPRESSION: Negative intracranial MRA. No large vessel occlusion, hemodynamically significant stenosis or other acute vascular abnormality. Electronically Signed   By: Rise Mu M.D.   On: 01/30/2021 20:13    PHYSICAL EXAM General: Appears well-developed ; no acute distress. Psych: Affect appropriate to situation Eyes: No scleral injection HENT: No OP obstrucion Head: Normocephalic.  Cardiovascular: Normal rate; irreg rhythm Respiratory: Effort normal and breath sounds normal to anterior ascultation GI: Soft.  No distension. There is no tenderness.  Skin: WDI    Neurological Examination Mental Status: Alert, oriented, thought content appropriate.  Speech fluent without evidence of aphasia. Able to follow 3 step commands without difficulty. Cranial Nerves: II: Visual fields grossly normal,  III,IV, VI: ptosis not present, extra-ocular motions intact bilaterally, pupils equal, round, reactive to light and accommodation V,VII: smile symmetric, facial light touch sensation normal bilaterally VIII: hearing normal bilaterally IX,X: uvula rises symmetrically XI: bilateral  shoulder shrug XII: midline tongue extension Motor: Right : Upper extremity   5/5    Left:     Upper extremity   5/5  Lower extremity   5/5     Lower extremity   5/5 Tone and bulk:normal tone throughout; no atrophy noted Sensory: Pinprick and light touch intact throughout, bilaterally Deep Tendon Reflexes: 2+ and symmetric throughout Plantars: Right: downgoing   Left: downgoing Cerebellar: normal finger-to-nose, normal rapid alternating movements and normal heel-to-shin test Gait: normal gait and station   ASSESSMENT/PLAN Steve Gordon is a 85 y.o. male with stroke risk factors of CAD, HLD, and HTN. Patient had a 30 min episode of lightheadedness and gait abnormality. He went to urgent care and was found to be in new AF and was to ER. He is on Plavix due to history of PCI and not on a statin due to intolerance. Admit for suspected TIA with ABCD2 score under 4 (his is 3).   Can not rule out TIA in setting of new onset Afib Code Stroke CT head No acute abnormality. Small vessel disease. Atrophy. ASPECTS 10.    MRI  no stroke; frontal sinusitis and chronic microvascular disease MRA  neg Carotid Doppler  LICA 40-59%, RICA <39% 2D Echo no need to repeat: done 3/31: 60%EF,  LDL 152 HgbA1c 5.3 VTE prophylaxis - lovenox    Diet   Diet Heart Room service appropriate? Yes; Fluid consistency: Thin   clopidogrel 75 mg daily prior to admission, now on aspirin 81 mg daily and clopidogrel 75 mg daily.  Therapy recommendations:  pending Disposition:  Likely home; no neuro deficits  Hypertension Home meds:  norvasc Stable Permissive hypertension (OK if < 220/120) but gradually normalize in 5-7 days Long-term BP goal normotensive  Hyperlipidemia Home meds:  none- d/t intolerance LDL 152, goal < 70 Consult with PCP about statin alternative   Diabetes type II no hx HgbA1c 5.3, goal < 7.0 CBGs No results for input(s): GLUCAP in the last 72 hours.  SSI  Other Stroke Risk  Factors Advanced Age >/= 20  Coronary  artery disease- on Plavix s/p  multiple stents  Other Active Problems New onset Afib: CHA2DS2 VASC score=3. From primary stroke prevention, would recommend starting DOAC such as Eliquis alone, we typically do not recommend triple therapy tx for stroke prevention. Will leave this decision to Cardiology given his CAD and extensive stenting.   Hospital day # 1 Ok from neuro stand point to dc home. Out Steve Gordon neuro f/u with [facility] in 4-6 weeks if desired by Steve Gordon and Steve Gordon.  Steve Gordon, ARNP-C, ANVP-BC Pager: [phone]    ATTENDING NOTE: I reviewed above note and agree with the assessment and plan. Steve Gordon was seen and examined.   Steve Gordon at the bedside. She stated that Steve Gordon woke up yesterday morning feeling dizzy, when he got up he had a difficulty with gait.  However, patient cannot remember and he did not endorse any difference from his baseline.  CT and MRI no acute finding.  MRA negative.  Carotid Doppler left ICA 40 to 59% stenosis.  2D echo pending, last 2D echo 10/2020 normal EF.  LE venous Doppler pending.  A1c 5.3 UDS negative, LDL pending.  Patient neurologically intact.  No focal deficits.  Etiology for patient's symptoms not quite clear, symptoms are nonspecific, however, cannot rule out TIA in the setting of new diagnosed A. fib.  Cardiology on board, given his history of CAD and new diagnosed A. fib, recommend Eliquis and aspirin.  We agree with the recommendation.  Currently LDL pending, however his LDL in 10/2020 was 152, he is not on statin PTA, will initiate lipitor 40 and continue on discharge.   For detailed assessment and plan, please refer to above as I have made changes wherever appropriate.   Neurology will sign off. Please call with questions. Steve Gordon will follow up with stroke clinic NP at [facility] in about 4 weeks. Thanks for the consult.   Marvel Plan, MD PhD Stroke Neurology 01/31/2021 2:30 PM    To contact Stroke Continuity provider,  please refer to [email or web address]. After hours, contact General Neurology

## 2021-02-23 ENCOUNTER — Other Ambulatory Visit: Payer: Self-pay | Admitting: Family Medicine

## 2021-03-16 NOTE — Progress Notes (Addendum)
Cardiology Office Note:    Date:  03/17/2021   ID:  Steve Gordon, DOB 1936-05-13, MRN [id number]  PCP:  Moses Manners, MD   [facility] Providers Cardiologist:  Verne Carrow, MD      Referring MD: Moses Manners, MD   Chief Complaint:  Hospitalization Follow-up (AFib, ?TIA )    Patient Profile:    Steve Gordon is a 85 y.o. male with:  Coronary artery disease  S/p anterior MI in 1998 s/p BMS to LAD S/p CABG in 2003 1/4 bypass grafts patent by cath in 2014 S/p MBS to [facility] in 2008 S/p DES to OM1 in 2011 S/p DES to OM1 in 8/14 (overlapping with previous stent) Atrial fibrillation  ?TIA (admx 6/22 with new onset AF and dizziness) Hyperlipidemia  Intol to statins  Hypertension  Chronic kidney disease  Mild mitral regurgitation  Echocardiogram 3/22: EF 60-65, trivial MR Echocardiogram 6/22: EF 55-60, mild MR  Intol of beta-blocker due to bradycardia   Prior CV studies: Echocardiogram 01/31/21 EF 55-60, no RWMA, mild conc LVH, low normal RVSF, RVSP 20.5, mild RAE, mild MR, trivial AI  VAS US CAROTID DUPLEX BILATERAL 01/31/2021 Right Carotid: Velocities in the right ICA are consistent with a 1-39% stenosis. Left Carotid: Velocities in the left ICA are consistent with a 40-59% stenosis.  Echocardiogram 11/06/20 EF 60-65, no RWMA, normal RVSF, RVSP 26.6, triival MR, trivial AI  NON-TELEMETRY MONITORING HOOKUP AND INTERP 07/18/2015 Narrative Sinus rhythm with premature atrial contractions. No evidence of supraventricular tachycardia, atrial fibrillation or ventricular tachycardia. No evidence of atrio-ventricular block.   Cardiac catheterization 03/26/13 LM ok p-mLAD 60-70 ISR; Dx 100 pLCx stent patent; OM1 85-90 dist to stent  mRCA 40, patent stent with 30 ISR SVG to Diagonal is patent SVG to OM known to be occluded and not injected SVG to PDA known to be occluded and not injected LIMA to mid LAD is known to be atretic and not injected PCI: 3.0 x  32 mm Promus Premier DES in the OM branch overlapping the old stent  History of Present Illness: Mr. Groft was last seen by Dr. Clifton James in 3/22.  He was admitted in June 2022 with dizziness and new onset atrial fibrillation.  He was seen by neurology.  There were no acute findings on CT or MRI.  Etiology of dizziness was not entirely clear but TIA could not be ruled out.  He was placed on Apixaban in addition to ASA.  Clopidogrel was DC'd.  He was placed on low dose Metoprolol for rate control.  Review of DC summary and Cardiology notes demonstrates no mention of proceeding with DCCV.  He returns for f/u.  He is here with his wife.  He notes symptoms of fatigue and shortness of breath with exertion for the last sever mos.  He has not had chest pain, orthopnea, syncope.  He has chronic leg edema (R>L) that is fairly stable for him.      Past Medical History:  Diagnosis Date   CAD (coronary artery disease)    a. Ant MI 1998;  b. 2003: s/p CABG x 4 (VG->Diag, VG->OM, VG->PDA, LIMA->LAD);  c. PCI 2008: RCA - 4.0x16 Liberte BMS;  d. 07/2010 PCI: OM1- 3.5x20 Promus DES;  e. 03/2013 Cath/PCI: LM nl, LAD 60-70p/m (FFR 0.9->Med Rx), Diag 100, LCX patent prox stent, OM1 95-90(3.0x32 Promus Premier DES), RCA dom, 22m, 30 ISR, VG->Diag ok, VG->OM KTBO, VG->PDA KTBO, LIMA->LAD known atresia, EF 55%   Dyslipidemia  Hypertension    Statin intolerance     Current Medications: Current Meds  Medication Sig   aspirin EC 81 MG EC tablet Take 1 tablet (81 mg total) by mouth daily. Swallow whole.   ELIQUIS 5 MG TABS tablet Take 1 tablet by mouth twice daily   nitroGLYCERIN (NITROSTAT) 0.4 MG SL tablet Place 1 tablet (0.4 mg total) under the tongue every 5 (five) minutes as needed for chest pain.   [DISCONTINUED] atorvastatin (LIPITOR) 40 MG tablet Take 1 tablet by mouth once daily   [DISCONTINUED] metoprolol tartrate (LOPRESSOR) 25 MG tablet Take 0.5 tablets (12.5 mg total) by mouth 2 (two) times daily.      Allergies:   Patient has no known allergies.   Social History   Tobacco Use   Smoking status: Never   Smokeless tobacco: Never  Vaping Use   Vaping Use: Never used  Substance Use Topics   Alcohol use: Yes    Comment: 03/26/2013 "couple shots some weeks; none for 2 months; just take a drink q now and then"   Drug use: No     Family Hx: The patient's family history includes Heart disease in an other family member; Hypertension in an other family member; Kidney cancer in an other family member.  Review of Systems  HENT:         +recent bleeding from his dentures  Respiratory:  Negative for hemoptysis.   Gastrointestinal:  Negative for hematochezia and melena.  Genitourinary:  Negative for hematuria.  All other systems reviewed and are negative.   EKGs/Labs/Other Test Reviewed:    EKG:  EKG is   ordered today.  The ekg ordered today demonstrates atrial fibrillation, HR 91, normal axis, non-specific ST-TW changes, QTc 452 ms  Recent Labs: 01/30/2021: Hemoglobin 14.3; Platelets 210 01/31/2021: BUN 13; Creatinine, Ser 1.33; Magnesium 2.1; Potassium 4.5; Sodium 139; TSH 0.444   Recent Lipid Panel Lab Results  Component Value Date/Time   CHOL 182 01/31/2021 06:03 AM   CHOL 218 (H) 11/06/2020 11:21 AM   TRIG 77 01/31/2021 06:03 AM   HDL 41 01/31/2021 06:03 AM   HDL 49 11/06/2020 11:21 AM   LDLCALC 126 (H) 01/31/2021 06:03 AM   LDLCALC 152 (H) 11/06/2020 11:21 AM   LDLDIRECT 142 (H) 11/06/2020 11:21 AM      Risk Assessment/Calculations:    CHA2DS2-VASc Score = 6  This indicates a 9.7% annual risk of stroke. The patient's score is based upon: CHF History: No HTN History: Yes Diabetes History: No Stroke History: Yes Vascular Disease History: Yes Age Score: 2 Gender Score: 0     Physical Exam:    VS:  BP (!) 150/80   Pulse 91   Ht 5\' 4"  (1.626 m)   Wt 167 lb 6.4 oz (75.9 kg)   SpO2 97%   BMI 28.73 kg/m     Wt Readings from Last 3 Encounters:  03/17/21 167  lb 6.4 oz (75.9 kg)  01/30/21 163 lb 9.3 oz (74.2 kg)  10/14/20 169 lb (76.7 kg)     Constitutional:      Appearance: Healthy appearance. Not in distress.  Neck:     Vascular: No JVR. JVD normal.     Lymphadenopathy: No cervical adenopathy.  Pulmonary:     Effort: Pulmonary effort is normal.     Breath sounds: No wheezing. No rales.  Cardiovascular:     Normal rate. Irregularly irregular rhythm. Normal S1. Normal S2.      Murmurs: There is no  murmur.  Edema:    Pretibial: trace edema of the left pretibial area and 1+ edema of the right pretibial area. Abdominal:     General: There is no distension.     Palpations: Abdomen is soft. There is no hepatomegaly.  Skin:    General: Skin is warm and dry.  Neurological:     General: No focal deficit present.     Mental Status: Alert and oriented to person, place and time.     Cranial Nerves: Cranial nerves are intact.  Psychiatric:        Mood and Affect: Affect normal.         ASSESSMENT & PLAN:    1. Persistent atrial fibrillation (HCC) He remains in atrial fibrillation.  HR is in the 90s today but he has not had any medications yet.  He has been on anticoagulation with Apixaban without interruption since DC (01/31/21).  He has symptoms of shortness of breath and fatigue.  I think he is symptomatic with atrial fibrillation.  I have recommended proceeding with DCCV to restore normal sinus rhythm.  I d/w Dr. Katrinka Blazing (attending MD) who agreed.  We discussed +/- proceeding with a TEE with the DCCV given the questionable hx of a recent TIA.  However, since he has been on anticoagulation for 6+ weeks, we do not feel this is necessary.  I will have him set up for a DCCV in the next 1-2 weeks and f/u in the AFib Clinic afterward.  Obtain BMET, CBC today.   2. Shortness of breath His shortness of breath seems to be all related to atrial fibrillation.  He has had chest pain in the past as his anginal equivalent.  He has some lower extremity edema  but no elevated JVD or rales on lung exam. His weights are stable.  I will get an NT-pro BNP to be certain that he is not significantly volume overloaded.  I expect it to be s/w elevated.  But, if it is significantly elevated, I will start him on Furosemide.    3. Coronary artery disease involving native coronary artery of native heart without angina pectoris History of anterior MI 1998 treated with BMS to the LAD, CABG in 2003, BMS to the RCA in 2008, DES to the OM1 in 2011 and again in 2014.  His last catheterization demonstrated 1/4 bypass grafts patent.  As noted, he has been short of breath with exertion recently.  However, he has not had chest pain, his typical anginal equivalent.  His electrocardiogram does not demonstrate any significant changes.  Continue aspirin, statin, beta-blocker.  Follow-up with Dr. Clifton James in 3 to 4 months.  4. Stage 3a chronic kidney disease (HCC) Obtain follow-up BMET today.  5. Nonrheumatic mitral valve regurgitation Mild by most recent echocardiogram.  6. Essential hypertension, benign Blood pressure somewhat elevated.  However, he has not taken any medications yet today.  I have asked him to continue to monitor his blood pressures at home.  If they continue to run >130/80, we will need to consider resuming his amlodipine.  7. Pure hypercholesterolemia He has been intolerant to statins in the past.  He was most recently placed on atorvastatin at the time of his questionable TIA.  Continue current medications.  If he remains on this without significant side effects, he will need follow-up lipids and LFTs arranged at his next office visit.    Shared Decision Making/Informed Consent The risks (stroke, cardiac arrhythmias rarely resulting in the need for a temporary  or permanent pacemaker, skin irritation or burns and complications associated with conscious sedation including aspiration, arrhythmia, respiratory failure and death), benefits (restoration of normal  sinus rhythm) and alternatives of a direct current cardioversion were explained in detail to Mr. Florentino and he agrees to proceed.      Dispo:  Return in about 3 months (around 06/17/2021) for Routine Follow Up w/ Dr. Clifton James.   Medication Adjustments/Labs and Tests Ordered: Current medicines are reviewed at length with the patient today.  Concerns regarding medicines are outlined above.  Tests Ordered: Orders Placed This Encounter  Procedures   Basic metabolic panel   CBC   Pro b natriuretic peptide (BNP)   EKG 12-Lead    Medication Changes: Meds ordered this encounter  Medications   metoprolol tartrate (LOPRESSOR) 25 MG tablet    Sig: Take 0.5 tablets (12.5 mg total) by mouth 2 (two) times daily.    Dispense:  90 tablet    Refill:  3   atorvastatin (LIPITOR) 40 MG tablet    Sig: Take 1 tablet (40 mg total) by mouth daily.    Dispense:  90 tablet    Refill:  3     Signed, Tereso Newcomer, PA-C  03/17/2021 12:32 PM    [facility] [address] [place], [place], [place]  [address] Phone: [phone]; Fax: [phone]

## 2021-03-17 ENCOUNTER — Other Ambulatory Visit: Payer: Self-pay

## 2021-03-17 ENCOUNTER — Ambulatory Visit: Payer: PPO | Admitting: Physician Assistant

## 2021-03-17 ENCOUNTER — Encounter: Payer: Self-pay | Admitting: Physician Assistant

## 2021-03-17 ENCOUNTER — Other Ambulatory Visit: Payer: Self-pay | Admitting: *Deleted

## 2021-03-17 VITALS — BP 150/80 | HR 91 | Ht 64.0 in | Wt 167.4 lb

## 2021-03-17 DIAGNOSIS — I340 Nonrheumatic mitral (valve) insufficiency: Secondary | ICD-10-CM | POA: Diagnosis not present

## 2021-03-17 DIAGNOSIS — I4819 Other persistent atrial fibrillation: Secondary | ICD-10-CM | POA: Diagnosis not present

## 2021-03-17 DIAGNOSIS — I10 Essential (primary) hypertension: Secondary | ICD-10-CM | POA: Diagnosis not present

## 2021-03-17 DIAGNOSIS — R0602 Shortness of breath: Secondary | ICD-10-CM | POA: Diagnosis not present

## 2021-03-17 DIAGNOSIS — N1831 Chronic kidney disease, stage 3a: Secondary | ICD-10-CM | POA: Diagnosis not present

## 2021-03-17 DIAGNOSIS — I2510 Atherosclerotic heart disease of native coronary artery without angina pectoris: Secondary | ICD-10-CM | POA: Diagnosis not present

## 2021-03-17 DIAGNOSIS — E7800 Pure hypercholesterolemia, unspecified: Secondary | ICD-10-CM | POA: Diagnosis not present

## 2021-03-17 MED ORDER — APIXABAN 5 MG PO TABS: 5.0000 mg | ORAL_TABLET | Freq: Two times a day (BID) | ORAL | 1 refills | Status: DC

## 2021-03-17 MED ORDER — METOPROLOL TARTRATE 25 MG PO TABS: 12.5000 mg | ORAL_TABLET | Freq: Two times a day (BID) | ORAL | 3 refills | Status: DC

## 2021-03-17 MED ORDER — APIXABAN 5 MG PO TABS: 5.0000 mg | ORAL_TABLET | Freq: Two times a day (BID) | ORAL | 0 refills | Status: DC

## 2021-03-17 MED ORDER — ATORVASTATIN CALCIUM 40 MG PO TABS: 40.0000 mg | ORAL_TABLET | Freq: Every day | ORAL | 3 refills | Status: DC

## 2021-03-17 NOTE — Telephone Encounter (Signed)
Prescription refill request for Eliquis received. Indication:Afib Last office visit: 03/17/21 Alben Spittle) Scr: 1.33 (01/31/21) Age: 85 Weight: 75.9kg  Appropriate dose and refill sent to requested pharmacy.

## 2021-03-17 NOTE — Patient Instructions (Signed)
Medication Instructions:  Your physician recommends that you continue on your current medications as directed. Please refer to the Current Medication list given to you today.  *If you need a refill on your cardiac medications before your next appointment, please call your pharmacy*   Lab Work: Bmp, Bnp, Cbc- today   If you have labs (blood work) drawn today and your tests are completely normal, you will receive your results only by: MyChart Message (if you have MyChart) OR A paper copy in the mail If you have any lab test that is abnormal or we need to change your treatment, we will call you to review the results.   Testing/Procedures: Your physician has recommended that you have a Cardioversion (DCCV). Electrical Cardioversion uses a jolt of electricity to your heart either through paddles or wired patches attached to your chest. This is a controlled, usually prescheduled, procedure. Defibrillation is done under light anesthesia in the hospital, and you usually go home the day of the procedure. This is done to get your heart back into a normal rhythm. You are not awake for the procedure. Please see the instruction sheet given to you today.    Follow-Up: Follow up as scheduled with our office as scheduled   Follow up with the A-Fib clinic after your cardioversion   Other Instructions You are scheduled for a Cardioversion on 03/24/2021 with Dr. Kristeen Miss.  Please arrive at the [facility] (Main Entrance A) at [facility]: [address] [place], [place] [address] at 11:00 am. This time is 1 hour before your procedure to allow prep time.   DIET: Nothing to eat or drink after midnight except a sip of water with medications (see medication instructions below)  FYI: For your safety, and to allow [place] to monitor your vital signs accurately during the surgery/procedure we request that   if you have artificial nails, gel coating, SNS etc. Please have those removed prior to your  surgery/procedure. Not having the nail coverings /polish removed may result in cancellation or delay of your surgery/procedure.   Medication Instructions  Continue your anticoagulant: Eliquis  You will need to continue your anticoagulant after your procedure until you are told by your provider that it is safe to stop  You must have a responsible person to drive you home and stay in the waiting area during your procedure. Failure to do so could result in cancellation.  Bring your insurance cards.  *Special Note: Every effort is made to have your procedure done on time. Occasionally there are emergencies that occur at the hospital that may cause delays. Please be patient if a delay does occur.

## 2021-03-17 NOTE — Telephone Encounter (Signed)
Eliquis 5mg  refill request from checkout personnel.Refill was sent to and they prefer [facility] order. Patient is 85 years old, weight-75.9kg, Crea-1.33 on 01/31/2021, Diagnosis-Afib, and last seen by 02/02/2021 today-03/17/2021. Dose is appropriate based on dosing criteria. Will send in refill to requested pharmacy.

## 2021-03-17 NOTE — Addendum Note (Signed)
Addended by: Vernard Gambles on: 03/17/2021 12:35 PM   Modules accepted: Orders

## 2021-03-18 ENCOUNTER — Telehealth: Payer: Self-pay | Admitting: Physician Assistant

## 2021-03-18 DIAGNOSIS — I4819 Other persistent atrial fibrillation: Secondary | ICD-10-CM

## 2021-03-18 DIAGNOSIS — R0602 Shortness of breath: Secondary | ICD-10-CM

## 2021-03-18 LAB — BASIC METABOLIC PANEL
BUN/Creatinine Ratio: 12 (ref 10–24)
BUN: 19 mg/dL (ref 8–27)
CO2: 20 mmol/L (ref 20–29)
Calcium: 9 mg/dL (ref 8.6–10.2)
Chloride: 107 mmol/L — ABNORMAL HIGH (ref 96–106)
Creatinine, Ser: 1.53 mg/dL — ABNORMAL HIGH (ref 0.76–1.27)
Glucose: 94 mg/dL (ref 65–99)
Potassium: 4.6 mmol/L (ref 3.5–5.2)
Sodium: 143 mmol/L (ref 134–144)
eGFR: 45 mL/min/{1.73_m2} — ABNORMAL LOW (ref >59)

## 2021-03-18 LAB — CBC
Hematocrit: 43.6 % (ref 37.5–51.0)
Hemoglobin: 14.7 g/dL (ref 13.0–17.7)
MCH: 31.6 pg (ref 26.6–33.0)
MCHC: 33.7 g/dL (ref 31.5–35.7)
MCV: 94 fL (ref 79–97)
Platelets: 195 10*3/uL (ref 150–450)
RBC: 4.65 x10E6/uL (ref 4.14–5.80)
RDW: 12.6 % (ref 11.6–15.4)
WBC: 6 10*3/uL (ref 3.4–10.8)

## 2021-03-18 LAB — PRO B NATRIURETIC PEPTIDE: NT-Pro BNP: 3937 pg/mL — ABNORMAL HIGH (ref 0–486)

## 2021-03-18 MED ORDER — FUROSEMIDE 40 MG PO TABS: 40.0000 mg | ORAL_TABLET | Freq: Every day | ORAL | 3 refills | Status: DC

## 2021-03-18 NOTE — Telephone Encounter (Signed)
Please see result note for BMET, CBC, BNP from today.  Elevated BNP indicates excess fluid.  This could be related to the atrial fibrillation.  If he has decided to cancel the cardioversion, he does not need follow-up with the atrial fibrillation clinic in 2 weeks.  As noted on the result note, I will place him on furosemide.  He will need follow-up in clinic in the next 2 weeks with either Dr. Clifton James, me, and APP on Dr. Gibson Ramp team or any APP in any [facility] office.  He will also need follow-up labs again next week to recheck a BMET and NT-proBNP (as outlined in the result note).  Tereso Newcomer, PA-C 03/18/2021 1:36 PM

## 2021-03-18 NOTE — Telephone Encounter (Signed)
Steve Gordon is calling requesting the 8/16 procedure be canceled due to him deciding not to due it. Please callback to confirm it was canceled.

## 2021-03-18 NOTE — Telephone Encounter (Signed)
Patient's wife notified.  Patient will come in for lab work on August 18,2022 and appointment with Tereso Newcomer, PA on August 24,2022 at 12:15.  Prescription sent to [facility]

## 2021-03-18 NOTE — Telephone Encounter (Signed)
I spoke with patient's wife who states patient has decided not to proceed with cardioversion.  Procedure canceled.  Patient has follow up appointment with afib clinic on 8/23.  I told patient's wife I would check with Tereso Newcomer, PA to see if patient should still keep this for afib follow up.

## 2021-03-24 ENCOUNTER — Encounter (HOSPITAL_COMMUNITY): Admission: RE | Payer: Self-pay | Source: Home / Self Care

## 2021-03-24 ENCOUNTER — Ambulatory Visit (HOSPITAL_COMMUNITY): Admission: RE | Admit: 2021-03-24 | Payer: PPO | Source: Home / Self Care | Admitting: Cardiovascular Disease

## 2021-03-24 SURGERY — CARDIOVERSION
Anesthesia: Monitor Anesthesia Care

## 2021-03-26 ENCOUNTER — Other Ambulatory Visit: Payer: Self-pay

## 2021-03-26 ENCOUNTER — Other Ambulatory Visit: Payer: PPO

## 2021-03-26 DIAGNOSIS — R0602 Shortness of breath: Secondary | ICD-10-CM | POA: Diagnosis not present

## 2021-03-26 DIAGNOSIS — I4819 Other persistent atrial fibrillation: Secondary | ICD-10-CM

## 2021-03-27 LAB — BASIC METABOLIC PANEL
BUN/Creatinine Ratio: 13 (ref 10–24)
BUN: 24 mg/dL (ref 8–27)
CO2: 22 mmol/L (ref 20–29)
Calcium: 9.4 mg/dL (ref 8.6–10.2)
Chloride: 100 mmol/L (ref 96–106)
Creatinine, Ser: 1.78 mg/dL — ABNORMAL HIGH (ref 0.76–1.27)
Glucose: 132 mg/dL — ABNORMAL HIGH (ref 65–99)
Potassium: 4 mmol/L (ref 3.5–5.2)
Sodium: 139 mmol/L (ref 134–144)
eGFR: 37 mL/min/{1.73_m2} — ABNORMAL LOW (ref >59)

## 2021-03-27 LAB — PRO B NATRIURETIC PEPTIDE: NT-Pro BNP: 26 pg/mL (ref 0–486)

## 2021-03-31 ENCOUNTER — Ambulatory Visit (HOSPITAL_COMMUNITY): Payer: PPO | Admitting: Nurse Practitioner

## 2021-03-31 NOTE — Progress Notes (Signed)
Cardiology Office Note:    Date:  04/01/2021   ID:  Steve Gordon, DOB May 22, 1936, MRN [id number]  PCP:  Zenia Resides, MD   [facility] Providers Cardiologist:  Lauree Chandler, MD      Referring MD: Zenia Resides, MD   Chief Complaint:  Follow-up (CHF, atrial fibrillation)    Patient Profile:    Steve Gordon is a 85 y.o. male with:  Coronary artery disease  S/p anterior MI in 1998 s/p BMS to LAD S/p CABG in 2003 1/4 bypass grafts patent by cath in 2014 S/p MBS to [facility] in 2008 S/p DES to [place] in 2011 S/p DES to [place] in 8/14 (overlapping with previous stent) Atrial fibrillation  (HFpEF) heart failure with preserved ejection fraction  ?TIA (admx 6/22 with new onset AF and dizziness) Hyperlipidemia  Intol to statins  Hypertension  Chronic kidney disease  Mild mitral regurgitation  Echocardiogram 3/22: EF 60-65, trivial MR Echocardiogram 6/22: EF 55-60, mild MR  Intol of beta-blocker due to bradycardia    Prior CV studies: Echocardiogram 01/31/21 EF 55-60, no RWMA, mild conc LVH, low normal RVSF, RVSP 20.5, mild RAE, mild MR, trivial AI   VAS US CAROTID DUPLEX BILATERAL 01/31/2021 Right Carotid: Velocities in the right ICA are consistent with a 1-39% stenosis. Left Carotid: Velocities in the left ICA are consistent with a 40-59% stenosis.   Echocardiogram 11/06/20 EF 60-65, no RWMA, normal RVSF, RVSP 26.6, triival MR, trivial AI   NON-TELEMETRY MONITORING HOOKUP AND INTERP 07/18/2015 Narrative Sinus rhythm with premature atrial contractions. No evidence of supraventricular tachycardia, atrial fibrillation or ventricular tachycardia. No evidence of atrio-ventricular block.   Cardiac catheterization 03/26/13 LM ok p-mLAD 60-70 ISR; Dx 100 pLCx stent patent; OM1 85-90 dist to stent  mRCA 40, patent stent with 30 ISR SVG to Diagonal is patent SVG to OM known to be occluded and not injected SVG to PDA known to be occluded and not injected LIMA to  mid LAD is known to be atretic and not injected PCI: 3.0 x 32 mm Promus Premier DES in the OM branch overlapping the old stent   History of Present Illness: Mr. Eslick  was last seen by Dr. Angelena Form in 3/22.  He was admitted in June 2022 with dizziness and new onset atrial fibrillation.  He was seen by neurology.  There were no acute findings on CT or MRI.  Etiology of dizziness was not entirely clear but TIA could not be ruled out.  He was placed on Apixaban in addition to ASA.  Clopidogrel was DC'd.  He was placed on low dose Metoprolol for rate control.  I saw him in f/u 03/17/21.  He had significant shortness of breath and I suspected his symptoms were related to atrial fibrillation.  NT-Pro BNP was markedly elevated and I started him on Furosemide.  I set him up for DCCV but he decided to cancel this.  A f/u BMET demonstrated worsening Creatinine and a normal NT-Pro BNP.   His Furosemide was cut back to prn.  He returns for f/u.  He is here with his wife.  Since last seen, his shortness of breath has improved.  Most notable is improved lower extremity swelling.  He still does note some shortness of breath with more extreme activities.  However, this is a chronic symptom.  His fatigue is also improved.  He has not had chest pain, syncope.    Past Medical History:  Diagnosis Date   CAD (coronary artery  disease)    a. Ant MI 1998;  b. 2003: s/p CABG x 4 (VG->Diag, VG->OM, VG->PDA, LIMA->LAD);  c. PCI 2008: RCA - 4.0x16 Liberte BMS;  d. 07/2010 PCI: OM1- 3.5x20 Promus DES;  e. 03/2013 Cath/PCI: LM nl, LAD 60-70p/m (FFR 0.9->Med Rx), Diag 100, LCX patent prox stent, OM1 95-90(3.0x32 Promus Premier DES), RCA dom, 53m 30 ISR, VG->Diag ok, VG->OM KTBO, VG->PDA KTBO, LIMA->LAD known atresia, EF 55%   Dyslipidemia    Hypertension    Statin intolerance     Current Medications: Current Meds  Medication Sig   apixaban (ELIQUIS) 5 MG TABS tablet Take 1 tablet (5 mg total) by mouth 2 (two) times daily.    aspirin EC 81 MG EC tablet Take 1 tablet (81 mg total) by mouth daily. Swallow whole.   atorvastatin (LIPITOR) 40 MG tablet Take 1 tablet (40 mg total) by mouth daily.   metoprolol tartrate (LOPRESSOR) 25 MG tablet Take 0.5 tablets (12.5 mg total) by mouth 2 (two) times daily.   nitroGLYCERIN (NITROSTAT) 0.4 MG SL tablet Place 1 tablet (0.4 mg total) under the tongue every 5 (five) minutes as needed for chest pain.   [DISCONTINUED] furosemide (LASIX) 40 MG tablet Take 1 tablet (40 mg total) by mouth daily.     Allergies:   Patient has no known allergies.   Social History   Tobacco Use   Smoking status: Never   Smokeless tobacco: Never  Vaping Use   Vaping Use: Never used  Substance Use Topics   Alcohol use: Yes    Comment: 03/26/2013 "couple shots some weeks; none for 2 months; just take a drink q now and then"   Drug use: No     Family Hx: The patient's family history includes Heart disease in an other family member; Hypertension in an other family member; Kidney cancer in an other family member.  Review of Systems  Gastrointestinal:  Negative for hematochezia and melena.  Genitourinary:  Negative for hematuria.    EKGs/Labs/Other Test Reviewed:    EKG:  EKG is not ordered today.  The ekg ordered today demonstrates N/A  Recent Labs: 01/31/2021: Magnesium 2.1; TSH 0.444 03/17/2021: Hemoglobin 14.7; Platelets 195 03/26/2021: BUN 24; Creatinine, Ser 1.78; NT-Pro BNP 26; Potassium 4.0; Sodium 139   Recent Lipid Panel Lab Results  Component Value Date/Time   CHOL 182 01/31/2021 06:03 AM   CHOL 218 (H) 11/06/2020 11:21 AM   TRIG 77 01/31/2021 06:03 AM   HDL 41 01/31/2021 06:03 AM   HDL 49 11/06/2020 11:21 AM   LDLCALC 126 (H) 01/31/2021 06:03 AM   LDLCALC 152 (H) 11/06/2020 11:21 AM   LDLDIRECT 142 (H) 11/06/2020 11:21 AM      Risk Assessment/Calculations:    CHA2DS2-VASc Score = 6  This indicates a 9.7% annual risk of stroke. The patient's score is based upon: CHF  History: No HTN History: Yes Diabetes History: No Stroke History: Yes Vascular Disease History: Yes Age Score: 2 Gender Score: 0     Physical Exam:    VS:  BP 134/78   Pulse 76   Ht 5' 8"  (1.727 m)   Wt 166 lb 9.6 oz (75.6 kg)   SpO2 97%   BMI 25.33 kg/m     Wt Readings from Last 3 Encounters:  04/01/21 166 lb 9.6 oz (75.6 kg)  03/17/21 167 lb 6.4 oz (75.9 kg)  01/30/21 163 lb 9.3 oz (74.2 kg)     Constitutional:      Appearance: Healthy appearance.  Not in distress.  Pulmonary:     Effort: Pulmonary effort is normal.     Breath sounds: No wheezing. No rales.  Cardiovascular:     Normal rate. Irregularly irregular rhythm. Normal S1. Normal S2.      Murmurs: There is no murmur.  Edema:    Ankle: trace edema of the right ankle. Abdominal:     Palpations: Abdomen is soft.  Musculoskeletal:     Cervical back: Neck supple. Skin:    General: Skin is warm and dry.  Neurological:     General: No focal deficit present.     Mental Status: Alert and oriented to person, place and time.     Cranial Nerves: Cranial nerves are intact.       ASSESSMENT & PLAN:    1. Chronic heart failure with preserved ejection fraction (HCC) EF 55-60 by echocardiogram 6/22.  Volume status is significantly improved since taking furosemide 40 mg daily.  His creatinine did increase with diuresis.  We had asked for him to stop taking furosemide daily.  However, he has continued to take it.  He took a half dose yesterday.  I have recommended decreasing furosemide to 20 mg 3 days a week.  I will obtain a CMET in 2 weeks to follow-up on renal function and potassium.  I have advised him to weigh daily and to observe for increased swelling.  If he has significant weight gain or increased swelling, he can take an extra 20 mg dose of furosemide.  Follow-up with Dr. Angelena Form in January 2023 as planned.  2. Coronary artery disease involving native coronary artery of native heart without angina  pectoris History of anterior MI 1998 treated with BMS to the LAD, CABG in 2003, BMS to the RCA in 2008, DES to the OM1 in 2011 and again in 2014.  His last catheterization demonstrated 1/4 bypass grafts patent.  He is not having significant anginal symptoms.  Continue aspirin 81 mg daily, atorvastatin 40 mg daily, metoprolol tartrate 12.5 mg twice daily.  Follow-up in January as noted above.  Obtain follow-up CMET, fasting lipid panel in 2 weeks.  3. Persistent atrial fibrillation ([place]) By exam, he remains in atrial fibrillation.  At last visit we set him up for cardioversion to restore normal sinus rhythm.  However, he decided to cancel this.  At this point, he seems to be fairly asymptomatic with atrial fibrillation.  It is reasonable to pursue rate control at this point.  However, if he has more issues with decompensated heart failure, we will likely need to reconsider cardioversion.  He is currently tolerating anticoagulation.  Recent hemoglobin was normal.  Recent creatinine has been above 1.5x2.  We have adjusted his furosemide as noted.  If his creatinine remains 1.5 or greater at follow-up CMET in 2 weeks, we will need to reduce his dose of apixaban to 2.5 mg twice daily (age 63).  4. Stage 3a chronic kidney disease ([place]) As noted, follow-up CMET will be obtained in 2 weeks.  5. Essential hypertension, benign Fair control.  Continue metoprolol 12.5 mg twice daily.   Dispo:  Return in about 5 months (around 09/01/2021) for Routine follow up 5 months with Dr. Angelena Form. .   Medication Adjustments/Labs and Tests Ordered: Current medicines are reviewed at length with the patient today.  Concerns regarding medicines are outlined above.  Tests Ordered: Orders Placed This Encounter  Procedures   Lipid Profile   Comp Met (CMET)    Medication Changes: Meds ordered  this encounter  Medications   furosemide (LASIX) 40 MG tablet    Sig: Take 0.5 tablets (20 mg total) by mouth 3 (three) times a  week.    Dispense:  90 tablet    Refill:  3     Signed, Richardson Dopp, PA-C  04/01/2021 5:04 PM    [facility] [address], [place], [place]  [address] Phone: [phone]; Fax: [phone]

## 2021-04-01 ENCOUNTER — Encounter: Payer: Self-pay | Admitting: Physician Assistant

## 2021-04-01 ENCOUNTER — Ambulatory Visit: Payer: PPO | Admitting: Physician Assistant

## 2021-04-01 ENCOUNTER — Other Ambulatory Visit: Payer: Self-pay

## 2021-04-01 VITALS — BP 134/78 | HR 76 | Ht 68.0 in | Wt 166.6 lb

## 2021-04-01 DIAGNOSIS — I2510 Atherosclerotic heart disease of native coronary artery without angina pectoris: Secondary | ICD-10-CM | POA: Diagnosis not present

## 2021-04-01 DIAGNOSIS — I10 Essential (primary) hypertension: Secondary | ICD-10-CM | POA: Diagnosis not present

## 2021-04-01 DIAGNOSIS — I5032 Chronic diastolic (congestive) heart failure: Secondary | ICD-10-CM

## 2021-04-01 DIAGNOSIS — N1831 Chronic kidney disease, stage 3a: Secondary | ICD-10-CM | POA: Diagnosis not present

## 2021-04-01 DIAGNOSIS — I4819 Other persistent atrial fibrillation: Secondary | ICD-10-CM

## 2021-04-01 MED ORDER — FUROSEMIDE 40 MG PO TABS: 20.0000 mg | ORAL_TABLET | ORAL | 3 refills | Status: DC

## 2021-04-01 NOTE — Patient Instructions (Signed)
Medication Instructions:   CHANGE Lasix one half tablet by mouth ( 20 mg ) three times weekly.   *If you need a refill on your cardiac medications before your next appointment, please call your pharmacy*   Lab Work: Your physician recommends that you return for a FASTING lipid profile/cmet on Wednesday, September 14 between 7:30-4:30 fasting from midnight the night before.   If you have labs (blood work) drawn today and your tests are completely normal, you will receive your results only by: MyChart Message (if you have MyChart) OR A paper copy in the mail If you have any lab test that is abnormal or we need to change your treatment, we will call you to review the results.   Testing/Procedures:  -None  Follow-Up: At [facility], you and your health needs are our priority.  As part of our continuing mission to provide you with exceptional heart care, we have created designated Provider Care Teams.  These Care Teams include your primary Cardiologist (physician) and Advanced Practice Providers (APPs -  Physician Assistants and Nurse Practitioners) who all work together to provide you with the care you need, when you need it.  We recommend signing up for the patient portal called "MyChart".  Sign up information is provided on this After Visit Summary.  MyChart is used to connect with patients for Virtual Visits (Telemedicine).  Patients are able to view lab/test results, encounter notes, upcoming appointments, etc.  Non-urgent messages can be sent to your provider as well.   To learn more about what you can do with MyChart, go to [email or web address].    Your next appointment:   5 month(s)  The format for your next appointment:   In Person  Provider:   Verne Carrow, MD   Other Instructions

## 2021-04-22 ENCOUNTER — Other Ambulatory Visit: Payer: Self-pay

## 2021-04-22 ENCOUNTER — Other Ambulatory Visit: Payer: PPO | Admitting: *Deleted

## 2021-04-22 DIAGNOSIS — I4819 Other persistent atrial fibrillation: Secondary | ICD-10-CM

## 2021-04-22 DIAGNOSIS — I2510 Atherosclerotic heart disease of native coronary artery without angina pectoris: Secondary | ICD-10-CM | POA: Diagnosis not present

## 2021-04-22 DIAGNOSIS — N1831 Chronic kidney disease, stage 3a: Secondary | ICD-10-CM

## 2021-04-22 DIAGNOSIS — I10 Essential (primary) hypertension: Secondary | ICD-10-CM

## 2021-04-22 DIAGNOSIS — I5032 Chronic diastolic (congestive) heart failure: Secondary | ICD-10-CM

## 2021-04-22 LAB — COMPREHENSIVE METABOLIC PANEL
ALT: 20 IU/L (ref 0–44)
AST: 22 IU/L (ref 0–40)
Albumin/Globulin Ratio: 2.3 — ABNORMAL HIGH (ref 1.2–2.2)
Albumin: 4.4 g/dL (ref 3.6–4.6)
Alkaline Phosphatase: 93 IU/L (ref 44–121)
BUN/Creatinine Ratio: 11 (ref 10–24)
BUN: 17 mg/dL (ref 8–27)
Bilirubin Total: 2 mg/dL — ABNORMAL HIGH (ref 0.0–1.2)
CO2: 22 mmol/L (ref 20–29)
Calcium: 9 mg/dL (ref 8.6–10.2)
Chloride: 106 mmol/L (ref 96–106)
Creatinine, Ser: 1.48 mg/dL — ABNORMAL HIGH (ref 0.76–1.27)
Globulin, Total: 1.9 g/dL (ref 1.5–4.5)
Glucose: 99 mg/dL (ref 65–99)
Potassium: 4.1 mmol/L (ref 3.5–5.2)
Sodium: 142 mmol/L (ref 134–144)
Total Protein: 6.3 g/dL (ref 6.0–8.5)
eGFR: 47 mL/min/{1.73_m2} — ABNORMAL LOW (ref >59)

## 2021-04-22 LAB — LIPID PANEL
Chol/HDL Ratio: 3.1 ratio (ref 0.0–5.0)
Cholesterol, Total: 128 mg/dL (ref 100–199)
HDL: 41 mg/dL (ref >39)
LDL Chol Calc (NIH): 74 mg/dL (ref 0–99)
Triglycerides: 62 mg/dL (ref 0–149)
VLDL Cholesterol Cal: 13 mg/dL (ref 5–40)

## 2021-04-24 ENCOUNTER — Other Ambulatory Visit: Payer: Self-pay | Admitting: *Deleted

## 2021-04-24 DIAGNOSIS — N1831 Chronic kidney disease, stage 3a: Secondary | ICD-10-CM

## 2021-07-22 ENCOUNTER — Other Ambulatory Visit: Payer: Self-pay

## 2021-07-22 ENCOUNTER — Other Ambulatory Visit: Payer: PPO | Admitting: *Deleted

## 2021-07-22 DIAGNOSIS — N1831 Chronic kidney disease, stage 3a: Secondary | ICD-10-CM

## 2021-07-22 LAB — BASIC METABOLIC PANEL
BUN/Creatinine Ratio: 11 (ref 10–24)
BUN: 17 mg/dL (ref 8–27)
CO2: 23 mmol/L (ref 20–29)
Calcium: 9.3 mg/dL (ref 8.6–10.2)
Chloride: 106 mmol/L (ref 96–106)
Creatinine, Ser: 1.58 mg/dL — ABNORMAL HIGH (ref 0.76–1.27)
Glucose: 101 mg/dL — ABNORMAL HIGH (ref 70–99)
Potassium: 4.7 mmol/L (ref 3.5–5.2)
Sodium: 143 mmol/L (ref 134–144)
eGFR: 43 mL/min/{1.73_m2} — ABNORMAL LOW (ref >59)

## 2021-07-23 ENCOUNTER — Telehealth: Payer: Self-pay

## 2021-07-23 MED ORDER — APIXABAN 2.5 MG PO TABS: 2.5000 mg | ORAL_TABLET | Freq: Two times a day (BID) | ORAL | 3 refills | Status: DC

## 2021-07-23 NOTE — Telephone Encounter (Signed)
Patient notified via MyChart. Rx has been sent in for reduced dose of Eliquis.

## 2021-07-23 NOTE — Telephone Encounter (Signed)
-----   Message from Beatrice Lecher, PA-C sent at 07/23/2021  9:13 AM EST ----- Creatinine stable.  K+ normal.   Creatinine has been consistently 1.5 or higher since 8/22.  Given this plus age >62, we need to reduce his dose of Eliquis.  PLAN:  - Decrease Eliquis to 2.5 mg twice daily  - Repeat BMET can be done at f/u with Dr. Kirt Boys, PA-C    07/23/2021 9:05 AM

## 2021-07-27 ENCOUNTER — Encounter: Payer: Self-pay | Admitting: Cardiovascular Disease

## 2021-07-27 NOTE — Telephone Encounter (Signed)
Error

## 2021-08-07 ENCOUNTER — Encounter: Payer: Self-pay | Admitting: Cardiovascular Disease

## 2021-08-20 ENCOUNTER — Telehealth: Payer: Self-pay

## 2021-08-20 ENCOUNTER — Encounter: Payer: Self-pay | Admitting: Cardiovascular Disease

## 2021-08-20 NOTE — Telephone Encounter (Signed)
Patient's wife calls nurse line reporting 2 positive home COVID tests.   Called 911 earlier today regarding symptoms. Low grade fever, BP slightly elevated, did not recommend ED evaluation. Suggested contacting PCP for further management.   Reports confusion last night, productive cough, nausea, diarrhea, headache. Last temperature was 99.0. Wife reports that confusion has improved. Provided with supportive measures.   Wife had questions about what medications were safe to take with his heart medications and if patient would be candidate for antiviral therapy.   Provided with ED precautions.   Please advise.   Talbot Grumbling, RN

## 2021-08-20 NOTE — Telephone Encounter (Signed)
Attempted phone call to pt and left voicemail message to contact triage at [phone].  Advised will also respond to pt's MyChart message.

## 2021-08-21 NOTE — Telephone Encounter (Signed)
Called paitent.  He is weak, but no SOB or CP.  Had been taking Nyquil which may have added to his confusion.  Told no Nyquil, tylenol only.  Also PO intake is some decreased. As such, told to hold lasix until he is eating and drinking nomrally.

## 2021-08-28 ENCOUNTER — Encounter: Payer: Self-pay | Admitting: Cardiovascular Disease

## 2021-08-28 ENCOUNTER — Ambulatory Visit: Payer: PPO | Admitting: Cardiovascular Disease

## 2021-08-28 ENCOUNTER — Other Ambulatory Visit: Payer: Self-pay

## 2021-08-28 VITALS — BP 124/76 | HR 89 | Ht 68.0 in | Wt 168.0 lb

## 2021-08-28 DIAGNOSIS — I340 Nonrheumatic mitral (valve) insufficiency: Secondary | ICD-10-CM

## 2021-08-28 DIAGNOSIS — I4819 Other persistent atrial fibrillation: Secondary | ICD-10-CM

## 2021-08-28 DIAGNOSIS — I10 Essential (primary) hypertension: Secondary | ICD-10-CM

## 2021-08-28 DIAGNOSIS — I2510 Atherosclerotic heart disease of native coronary artery without angina pectoris: Secondary | ICD-10-CM

## 2021-08-28 DIAGNOSIS — E7800 Pure hypercholesterolemia, unspecified: Secondary | ICD-10-CM | POA: Diagnosis not present

## 2021-08-28 NOTE — Progress Notes (Signed)
Chief Complaint  Patient presents with   Follow-up    CAD, atrial fib   History of Present Illness: 86 yo male with history of atrial fib, CAD s/p 4V CABG 2003 (LIMA to LAD, SVG to Diagonal, SVG to OM, SVG to PDA), HLD with statin intolerance, HTN and mild mitral regurgitation who is here today for cardiac follow up. He had been followed in the past by Dr. Riley Kill. He had an anterior MI in 1998 at which time a bare metal stent was placed in the LAD. Cardiac cath 2003 with severe stenoses in the Diagonal, OM2 and PDA. He underwent 4V CABG in 2003. He then had PCI of the mid RCA in 2008 (4.0 x 16 mm Liberte bare metal stent) and PCI of the OM1 with DES in 2011 (3.5 x 20 mm Promus Premier). I saw him in August 2014 and he had chest pain c/w unstable angina. Repeat cath 03/26/13: prox to mid LAD 60-70% (unchanged), Diagonal occluded, OM1 85-90% dist to stent, mid RCA 40%, mid RCA stent patent with 30% ISR, SVG to Diagonal patent, SVG to OM known to be occluded, SVG to PDA known to be occluded, LIMA to LAD known to be atretic, EF 55%. FFR of LAD: 0.90 (not flow limiting). 3.0 x32 mm Promus premier DES placed in the OM1 overlapping old stent. 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 Echo December 2016 with normal LV function and mild MR. Event monitor December 2016 with sinus brady, PACs, no pauses or high grade AV block. He was admitted to [facility] June 2022 with dizziness and new onset atrial fibrillation. Possible TIA. He was started on ASA and Eliquis. Plavix was stopped. He cancelled a cardioversion. Echo June 2022 with LVEF=55-60%, mild LVH, Mild MR. He was seen by Tereso Newcomer, PA-C in August 2022 and was doing well.   He is here today for follow up. The patient denies any chest pain, dyspnea, palpitations, lower extremity edema, orthopnea, PND, dizziness, near syncope or syncope. He is having memory issues. His wife is with him today. Overall feeling well.    Primary Care Physician: Moses Manners, MD  Past Medical History:  Diagnosis Date   Atrial fibrillation [facility])    CAD (coronary artery disease)    a. Ant MI 1998;  b. 2003: s/p CABG x 4 (VG->Diag, VG->OM, VG->PDA, LIMA->LAD);  c. PCI 2008: RCA - 4.0x16 Liberte BMS;  d. 07/2010 PCI: OM1- 3.5x20 Promus DES;  e. 03/2013 Cath/PCI: LM nl, LAD 60-70p/m (FFR 0.9->Med Rx), Diag 100, LCX patent prox stent, OM1 95-90(3.0x32 Promus Premier DES), RCA dom, 59m, 30 ISR, VG->Diag ok, VG->OM KTBO, VG->PDA KTBO, LIMA->LAD known atresia, EF 55%   Dyslipidemia    Hypertension    Statin intolerance     Past Surgical History:  Procedure Laterality Date   ANKLE FRACTURE SURGERY Left ~ 1999   "broke in 1993; casted; PCP said not broken; had MI; walked for rehab; wore ankle out; put in plates and screws" (03/26/2013)   bronchogenic cyst resection  08/06/1997   CHOLECYSTECTOMY     CORONARY ANGIOPLASTY WITH STENT PLACEMENT  1998-03/26/2013   "2 at one time in 1998; this one  today is the 3rd one I've had since [facility] in 2003" (03/26/2013)   CORONARY ANGIOPLASTY WITH STENT PLACEMENT  10/05/2006   4.26mmx 88mm Liberte non DES   CORONARY ARTERY BYPASS GRAFT  2003   x4 LIMA to LAD SVG digonal, OM, PDA  INGUINAL HERNIA REPAIR Right    "3 times" (03/26/2013)   LAPAROSCOPIC INCISIONAL / UMBILICAL / VENTRAL HERNIA REPAIR  2004   LEFT HEART CATHETERIZATION WITH CORONARY/GRAFT ANGIOGRAM N/A 03/26/2013   Procedure: LEFT HEART CATHETERIZATION WITH Beatrix Fetters;  Surgeon: Burnell Blanks, MD;  Location: [facility] CATH LAB;  Service: Cardiovascular;  Laterality: N/A;    Current Outpatient Medications  Medication Sig Dispense Refill   apixaban (ELIQUIS) 2.5 MG TABS tablet Take 1 tablet (2.5 mg total) by mouth 2 (two) times daily. 180 tablet 3   aspirin EC 81 MG EC tablet Take 1 tablet (81 mg total) by mouth daily. Swallow whole. 30 tablet 1   atorvastatin (LIPITOR) 40 MG tablet Take 1 tablet (40 mg total) by mouth  daily. 90 tablet 3   metoprolol tartrate (LOPRESSOR) 25 MG tablet Take 0.5 tablets (12.5 mg total) by mouth 2 (two) times daily. 90 tablet 3   nitroGLYCERIN (NITROSTAT) 0.4 MG SL tablet Place 1 tablet (0.4 mg total) under the tongue every 5 (five) minutes as needed for chest pain. 25 tablet 7   furosemide (LASIX) 40 MG tablet Take 0.5 tablets (20 mg total) by mouth 3 (three) times a week. (Patient not taking: Reported on 08/28/2021) 90 tablet 3   No current facility-administered medications for this visit.    No Known Allergies  Social History   Socioeconomic History   Marital status: Married    Spouse name: Not on file   Number of children: Not on file   Years of education: Not on file   Highest education level: Not on file  Occupational History   Occupation: Retired, but works on cars part-time  Tobacco Use   Smoking status: Never   Smokeless tobacco: Never  Vaping Use   Vaping Use: Never used  Substance and Sexual Activity   Alcohol use: Yes    Comment: 03/26/2013 "couple shots some weeks; none for 2 months; just take a drink q now and then"   Drug use: No   Sexual activity: Yes  Other Topics Concern   Not on file  Social History Narrative   Level of education: 10th grade   Employment: retired, previously self employed and truck Lexicographer: self    Exercise: works on his cars    Housing situation: house    Relationships (safe): yes   Sport and exercise psychologist for message (voicemail): Wife Mariann Laster) ok to leave information with her. [phone]   Lives in [place] with wife. 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   Social Determinants of Health   Financial Resource Strain: Not on file  Food Insecurity: Not on file  Transportation Needs: Not on file  Physical Activity: Not on file  Stress: Not on file  Social Connections: Not on file  Intimate Partner Violence: Not on file    Family History  Problem Relation Age of Onset   Hypertension Other    Heart disease Other     Kidney cancer Other     Review of Systems:  As stated in the HPI and otherwise negative.   BP 124/76    Pulse 89    Ht 5\' 8"  (1.727 m)    Wt 168 lb (76.2 kg)    SpO2 97%    BMI 25.54 kg/m   Physical Examination:  General: Well developed, well nourished, NAD  HEENT: OP clear, mucus membranes moist  SKIN: warm, dry. No rashes. Neuro: No focal deficits  Musculoskeletal: Muscle strength 5/5 all ext  Psychiatric: Mood and affect normal  Neck: No JVD, no carotid bruits, no thyromegaly, no lymphadenopathy.  Lungs:Clear bilaterally, no wheezes, rhonci, crackles Cardiovascular: Regular rate and rhythm. No murmurs, gallops or rubs. Abdomen:Soft. Bowel sounds present. Non-tender.  Extremities: No lower extremity edema. Pulses are 2 + in the bilateral DP/PT.  Echo June 2022:  1. Left ventricular ejection fraction, by estimation, is 55 to 60%. The  left ventricle has normal function. The left ventricle has no regional  wall motion abnormalities. There is mild concentric left ventricular  hypertrophy. Left ventricular diastolic  function could not be evaluated.   2. Right ventricular systolic function is low normal. The right  ventricular size is mildly enlarged. There is normal pulmonary artery  systolic pressure. The estimated right ventricular systolic pressure is  20.5 mmHg.   3. Right atrial size was mildly dilated.   4. The mitral valve is grossly normal. Mild mitral valve regurgitation.  No evidence of mitral stenosis.   5. The aortic valve is tricuspid. Aortic valve regurgitation is trivial.  No aortic stenosis is present.   6. The inferior vena cava is normal in size with greater than 50%  respiratory variability, suggesting right atrial pressure of 3 mmHg.   Cardiac cath 03/26/13: Hemodynamic Findings:  Central aortic pressure: 118/59  Left ventricular pressure: 121/8/12  Angiographic Findings:  Left main: No obstructive disease.  Left Anterior Descending Artery: Moderate to  large caliber vessel that courses to the apex. The proximal to mid vessel stented segment with 60-70% restenosis, grossly unchanged from last cath in 2011. The distal vessel has diffuse mild stenosis. The diagonal branch is occluded but fills from the patent vein graft.  Circumflex Artery: Large caliber vessel with patent stent in proximal vessel extending into OM1. The first OM branch has a hazy, 85-90% stenosis distal to the stent. The AV groove Circumflex is small in caliber and has mild plaque disease.  Right Coronary Artery: Large dominant vessel with 40% mid stenosis, patent mid stent with 30% restenosis, mild distal disease.  Graft Anatomy:  SVG to Diagonal is patent  SVG to OM known to be occluded and not injected  SVG to PDA known to be occluded and not injected  LIMA to mid LAD is known to be atretic and not injected  Left Ventricular Angiogram: LVEF=55%. 1+ MR  Impression:  1. Triple vessel CAD s/p CABG with 1/4 patent grafts, patent stents LAD and RCA.  2. Unstable angina (class III) secondary to severe stenosis OM1  3. Successful PTCA/DES x 1 OM1  4. Moderate disease stented segment of proximal LAD, FFR of 0.90 suggesting this lesion is not flow limiting.  5. Preserved LV systolic function  EKG:  EKG is not ordered today. The ekg ordered today demonstrates   Recent Labs: 01/31/2021: Magnesium 2.1; TSH 0.444 03/17/2021: Hemoglobin 14.7; Platelets 195 03/26/2021: NT-Pro BNP 26 04/22/2021: ALT 20 07/22/2021: BUN 17; Creatinine, Ser 1.58; Potassium 4.7; Sodium 143   Lipid Panel Lipid Panel     Component Value Date/Time   CHOL 128 04/22/2021 0943   TRIG 62 04/22/2021 0943   HDL 41 04/22/2021 0943   CHOLHDL 3.1 04/22/2021 0943   CHOLHDL 4.4 01/31/2021 0603   VLDL 15 01/31/2021 0603   LDLCALC 74 04/22/2021 0943   LDLDIRECT 142 (H) 11/06/2020 1121     Wt Readings from Last 3 Encounters:  08/28/21 168 lb (76.2 kg)  04/01/21 166 lb 9.6 oz (75.6 kg)  03/17/21 167 lb 6.4 oz  (75.9 kg)  Other studies Reviewed: Additional studies/ records that were reviewed today include: . Review of the above records demonstrates:    Assessment and Plan:   1. CAD without angina: He has no chest pain. He is known to have diffuse 3 vessel CAD s/p CABG and PCI. Last PCI was in 2014. One of 4 bypass grafts patent at time of last cath. Will continue ASA, statin and beta blocker.     2. HTN: BP is controlled. No changes  3. HLD: LDL near goal in September 2022. Continue statin  4. Mitral regurgitation: Mild by echo June 2022  5. Atrial fibrillation, persistent: Atrial fib today. Rate controlled. Continue Eliquis and beta blocker.   Current medicines are reviewed at length with the patient today.  The patient does not have concerns regarding medicines.  The following changes have been made:  no change  Labs/ tests ordered today include:   No orders of the defined types were placed in this encounter.   Disposition:   F/U with me in 6 months  Signed, Lauree Chandler, MD 08/28/2021 4:31 PM    [facility] [address], [place], [place]  [address] Phone: [phone]; Fax: [phone]

## 2021-08-28 NOTE — Patient Instructions (Signed)
Medication Instructions:  No changes *If you need a refill on your cardiac medications before your next appointment, please call your pharmacy*  Lab Work: none If you have labs (blood work) drawn today and your tests are completely normal, you will receive your results only by: . MyChart Message (if you have MyChart) OR . A paper copy in the mail If you have any lab test that is abnormal or we need to change your treatment, we will call you to review the results.  Testing/Procedures: none  Follow-Up: At [facility], you and your health needs are our priority.  As part of our continuing mission to provide you with exceptional heart care, we have created designated Provider Care Teams.  These Care Teams include your primary Cardiologist (physician) and Advanced Practice Providers (APPs -  Physician Assistants and Nurse Practitioners) who all work together to provide you with the care you need, when you need it.  Your next appointment:   6 month(s)  The format for your next appointment:   In Person  Provider:   Christopher McAlhany, MD  Other Instructions   

## 2021-10-28 ENCOUNTER — Ambulatory Visit (INDEPENDENT_AMBULATORY_CARE_PROVIDER_SITE_OTHER): Payer: PPO | Admitting: Family Medicine

## 2021-10-28 ENCOUNTER — Encounter: Payer: Self-pay | Admitting: Family Medicine

## 2021-10-28 ENCOUNTER — Other Ambulatory Visit: Payer: Self-pay

## 2021-10-28 DIAGNOSIS — I25708 Atherosclerosis of coronary artery bypass graft(s), unspecified, with other forms of angina pectoris: Secondary | ICD-10-CM | POA: Diagnosis not present

## 2021-10-28 DIAGNOSIS — F03B11 Unspecified dementia, moderate, with agitation: Secondary | ICD-10-CM | POA: Insufficient documentation

## 2021-10-28 DIAGNOSIS — R21 Rash and other nonspecific skin eruption: Secondary | ICD-10-CM

## 2021-10-28 DIAGNOSIS — I25118 Atherosclerotic heart disease of native coronary artery with other forms of angina pectoris: Secondary | ICD-10-CM | POA: Diagnosis not present

## 2021-10-28 DIAGNOSIS — I10 Essential (primary) hypertension: Secondary | ICD-10-CM

## 2021-10-28 DIAGNOSIS — I482 Chronic atrial fibrillation, unspecified: Secondary | ICD-10-CM

## 2021-10-28 DIAGNOSIS — R413 Other amnesia: Secondary | ICD-10-CM | POA: Diagnosis not present

## 2021-10-28 DIAGNOSIS — G3184 Mild cognitive impairment, so stated: Secondary | ICD-10-CM | POA: Insufficient documentation

## 2021-10-28 DIAGNOSIS — F03911 Unspecified dementia, unspecified severity, with agitation: Secondary | ICD-10-CM | POA: Insufficient documentation

## 2021-10-28 DIAGNOSIS — E7800 Pure hypercholesterolemia, unspecified: Secondary | ICD-10-CM | POA: Diagnosis not present

## 2021-10-28 DIAGNOSIS — Z0000 Encounter for general adult medical examination without abnormal findings: Secondary | ICD-10-CM | POA: Diagnosis not present

## 2021-10-28 MED ORDER — NITROGLYCERIN 0.4 MG SL SUBL: 0.4000 mg | SUBLINGUAL_TABLET | SUBLINGUAL | 7 refills | Status: DC | PRN

## 2021-10-28 MED ORDER — CLOBETASOL PROPIONATE 0.05 % EX OINT: 1.0000 "application " | TOPICAL_OINTMENT | Freq: Two times a day (BID) | CUTANEOUS | 0 refills | Status: DC

## 2021-10-28 NOTE — Patient Instructions (Addendum)
I sent in a new ointment for the ankle.  Try not to scratch the ankle. ?I sent in a refill of the nitroglycerin to [facility].   ?Check your shot record.  The latest covid booster should be labeled bivaltent.  If you have question, bring your immunization card for the nurse to review.   ?I will call with blood test results. ?Let me know if you are interested in trying something for memory.   ?Please get a flu shot next fall.   ?

## 2021-10-29 ENCOUNTER — Encounter: Payer: Self-pay | Admitting: Family Medicine

## 2021-10-29 DIAGNOSIS — Z0000 Encounter for general adult medical examination without abnormal findings: Secondary | ICD-10-CM | POA: Insufficient documentation

## 2021-10-29 LAB — BASIC METABOLIC PANEL
BUN/Creatinine Ratio: 12 (ref 10–24)
BUN: 17 mg/dL (ref 8–27)
CO2: 20 mmol/L (ref 20–29)
Calcium: 9.7 mg/dL (ref 8.6–10.2)
Chloride: 109 mmol/L — ABNORMAL HIGH (ref 96–106)
Creatinine, Ser: 1.43 mg/dL — ABNORMAL HIGH (ref 0.76–1.27)
Glucose: 95 mg/dL (ref 70–99)
Potassium: 5.4 mmol/L — ABNORMAL HIGH (ref 3.5–5.2)
Sodium: 143 mmol/L (ref 134–144)
eGFR: 48 mL/min/{1.73_m2} — ABNORMAL LOW (ref >59)

## 2021-10-29 LAB — LDL CHOLESTEROL, DIRECT: LDL Direct: 67 mg/dL (ref 0–99)

## 2021-10-29 NOTE — Progress Notes (Signed)
? ? ?  SUBJECTIVE:  ? ?CHIEF COMPLAINT / HPI:  ? ?Annual preventative exam. ?Overdue.  Has been followed by cards.   ?Acute problems None ?Chronic problems ?CAD stable.  No recent chest pain.  Asks for refill on nitroglycerin.  Last LDL not quite at goal.  Needs recheck. ?HBP.  Doing well on current meds.  No CP, DOE or lightheadedness.   ?A fib.  On eliquis  Rate controled.  No complaints of syncope or palpitations.   ?Memory issues:  Conversant.  Need frequent reminders from wife.  No major losses to date.  Wife asks if "prevagen helpful."  I had previously investigated in 6/22 with head CT and TSH.  I did not do RPR or HIV because very low risk.   ?Chronic rash on right ankle.  He scratches a lot.  Triamcinolone did not help.   ?HPDP  Got a covid booster in Sept? Did not bring shot record.  Did not get a flu shot this season and declines because so late in the season.  Also not interested in shingrix. ?He has aged out of screenings. ? ? ?OBJECTIVE:  ? ?BP 134/72   Pulse (!) 59   Wt 172 lb 12.8 oz (78.4 kg)   SpO2 97%   BMI 26.27 kg/m?   ?Neck supple without mass or thyromegally ?Lungs clear ?Cardiac RRR without m or g ?Abd beign ?Ext no edema.  Lichenified non infected rash right ankle. ?Neuro, motor sensory, gait cognition and affect grossly normal. ? ?ASSESSMENT/PLAN:  ? ?Preventative health care ?High functioning mail with no at risk behaviors.  Up to date on screening. ? ?Memory difficulties ?Likely mild cognitive impairment.  Doubt qualifies for dx of dementia. ?Explained prevagen unproven.  We discussed briefly "memory medications" (donepizil).  Wiffe not ready to commit to this yet. ? ?Chronic atrial fibrillation (HCC) ?Stable on current meds.  No change. ? ?HYPERTENSION, BENIGN ?Well controled on current meds ? ?HYPERCHOLESTEROLEMIA  IIA ?Tolerating atorvastatin well.  LDL rechecked and now at goal of <70 ? ?Coronary artery disease ?Stable on current meds.  No change. ? ?Rash and nonspecific skin  eruption ?Likely lichen simplex chronicus.  Rx with higher potency steroid.  Avoid scratching. ?  ? ? ?Moses Manners, MD ?[facility]  ?

## 2021-10-29 NOTE — Assessment & Plan Note (Signed)
Likely mild cognitive impairment.  Doubt qualifies for dx of dementia. ?Explained prevagen unproven.  We discussed briefly "memory medications" (donepizil).  Wiffe not ready to commit to this yet. ?

## 2021-10-29 NOTE — Assessment & Plan Note (Signed)
Stable on current meds.  No change. 

## 2021-10-29 NOTE — Assessment & Plan Note (Signed)
Tolerating atorvastatin well.  LDL rechecked and now at goal of <70 ?

## 2021-10-29 NOTE — Assessment & Plan Note (Signed)
High functioning mail with no at risk behaviors.  Up to date on screening. ?

## 2021-10-29 NOTE — Assessment & Plan Note (Signed)
Well controled on current meds 

## 2021-10-29 NOTE — Assessment & Plan Note (Signed)
Likely lichen simplex chronicus.  Rx with higher potency steroid.  Avoid scratching. ?

## 2021-11-04 ENCOUNTER — Encounter: Payer: Self-pay | Admitting: Family Medicine

## 2021-11-04 DIAGNOSIS — R413 Other amnesia: Secondary | ICD-10-CM

## 2021-11-04 MED ORDER — DONEPEZIL HCL 5 MG PO TABS: 5.0000 mg | ORAL_TABLET | Freq: Every day | ORAL | 0 refills | Status: DC

## 2021-12-13 ENCOUNTER — Encounter: Payer: Self-pay | Admitting: Family Medicine

## 2021-12-13 DIAGNOSIS — R413 Other amnesia: Secondary | ICD-10-CM

## 2021-12-14 MED ORDER — DONEPEZIL HCL 10 MG PO TABS: 10.0000 mg | ORAL_TABLET | Freq: Every day | ORAL | 6 refills | Status: DC

## 2022-01-12 ENCOUNTER — Encounter: Payer: Self-pay | Admitting: *Deleted

## 2022-01-13 ENCOUNTER — Encounter: Payer: Self-pay | Admitting: Family Medicine

## 2022-01-14 ENCOUNTER — Ambulatory Visit (INDEPENDENT_AMBULATORY_CARE_PROVIDER_SITE_OTHER): Payer: PPO | Admitting: Family Medicine

## 2022-01-14 ENCOUNTER — Encounter: Payer: Self-pay | Admitting: Family Medicine

## 2022-01-14 DIAGNOSIS — I482 Chronic atrial fibrillation, unspecified: Secondary | ICD-10-CM | POA: Diagnosis not present

## 2022-01-14 DIAGNOSIS — G3184 Mild cognitive impairment, so stated: Secondary | ICD-10-CM

## 2022-01-14 DIAGNOSIS — I25708 Atherosclerosis of coronary artery bypass graft(s), unspecified, with other forms of angina pectoris: Secondary | ICD-10-CM

## 2022-01-14 DIAGNOSIS — R531 Weakness: Secondary | ICD-10-CM

## 2022-01-14 DIAGNOSIS — N183 Chronic kidney disease, stage 3 unspecified: Secondary | ICD-10-CM | POA: Diagnosis not present

## 2022-01-14 DIAGNOSIS — I10 Essential (primary) hypertension: Secondary | ICD-10-CM

## 2022-01-14 MED ORDER — DONEPEZIL HCL 5 MG PO TABS: 5.0000 mg | ORAL_TABLET | Freq: Every day | ORAL | Status: DC

## 2022-01-14 NOTE — Assessment & Plan Note (Signed)
Check LDL to make sure at goal.

## 2022-01-14 NOTE — Patient Instructions (Addendum)
I will call with the blood test results Please check your blood pressure several times at home and let me know.  We are looking for readings on average that are less than 140/90 Things seem generally OK for being 86 years old.

## 2022-01-14 NOTE — Assessment & Plan Note (Signed)
Borderline high.  FU after multiple home BP readings.

## 2022-01-14 NOTE — Assessment & Plan Note (Signed)
Doing well on donepezil 5

## 2022-01-14 NOTE — Assessment & Plan Note (Signed)
Check labs.  Not diabetic

## 2022-01-14 NOTE — Progress Notes (Signed)
    SUBJECTIVE:   CHIEF COMPLAINT / HPI:   Weak and tired.  86 yo male who C/O generalized weakness and lack of energy.  "I can hardly go to the mailbox."  Did not get a clear history of dyspnea on exertion.  He denies focal weakness, chest pain, orthopnea.  Denies change in bowel, bladder appetite or weight. Has hx of CHF.  Recent echo and recent cards visit.  Not on diuretic.  No or minimal ankle swelling. Mild cognitive impaiirment.  I had MyChart exchange with wife prior to this visit.  She feels he does best on donnepizil 5 mg and that it does help.   A fib on chronic anticoag.  No bleeding but at higher risk for anemai.   High cholesterol, secondary prevention.  Last LDL not quite at goal.  Will recheck Direct LDL.     OBJECTIVE:   BP (!) 140/92   Pulse 85   Ht 5\' 8"  (1.727 m)   Wt 171 lb 6.4 oz (77.7 kg)   SpO2 98%   BMI 26.06 kg/m   Lungs clear Cardiac normal rate and mild irregular rhythm. Ext trace edema Rt>left Skin, no pallor.  ASSESSMENT/PLAN:   CAD, ARTERY BYPASS GRAFT Check LDL to make sure at goal.  CKD (chronic kidney disease) stage 3, GFR 30-59 ml/min (HCC) Check labs.  Not diabetic  Weakness Large differential including age related weakness.  Check labs.    Mild cognitive impairment Doing well on donepezil 5  HYPERTENSION, BENIGN Borderline high.  FU after multiple home BP readings.    Chronic atrial fibrillation (HCC) Stable on meds.  At risk for anemia due to anticoag.     Zenia Resides, MD [place]

## 2022-01-14 NOTE — Assessment & Plan Note (Signed)
Stable on meds.  At risk for anemia due to anticoag.

## 2022-01-14 NOTE — Assessment & Plan Note (Signed)
Large differential including age related weakness.  Check labs.

## 2022-01-15 LAB — COMPREHENSIVE METABOLIC PANEL
ALT: 35 IU/L (ref 0–44)
AST: 30 IU/L (ref 0–40)
Albumin/Globulin Ratio: 1.9 (ref 1.2–2.2)
Albumin: 4.2 g/dL (ref 3.6–4.6)
Alkaline Phosphatase: 111 IU/L (ref 44–121)
BUN/Creatinine Ratio: 12 (ref 10–24)
BUN: 18 mg/dL (ref 8–27)
Bilirubin Total: 2.1 mg/dL — ABNORMAL HIGH (ref 0.0–1.2)
CO2: 21 mmol/L (ref 20–29)
Calcium: 9.3 mg/dL (ref 8.6–10.2)
Chloride: 103 mmol/L (ref 96–106)
Creatinine, Ser: 1.54 mg/dL — ABNORMAL HIGH (ref 0.76–1.27)
Globulin, Total: 2.2 g/dL (ref 1.5–4.5)
Glucose: 103 mg/dL — ABNORMAL HIGH (ref 70–99)
Potassium: 4 mmol/L (ref 3.5–5.2)
Sodium: 139 mmol/L (ref 134–144)
Total Protein: 6.4 g/dL (ref 6.0–8.5)
eGFR: 44 mL/min/{1.73_m2} — ABNORMAL LOW (ref >59)

## 2022-01-15 LAB — CBC
Hematocrit: 42.4 % (ref 37.5–51.0)
Hemoglobin: 14.5 g/dL (ref 13.0–17.7)
MCH: 32.7 pg (ref 26.6–33.0)
MCHC: 34.2 g/dL (ref 31.5–35.7)
MCV: 96 fL (ref 79–97)
Platelets: 176 10*3/uL (ref 150–450)
RBC: 4.43 x10E6/uL (ref 4.14–5.80)
RDW: 13.1 % (ref 11.6–15.4)
WBC: 5.5 10*3/uL (ref 3.4–10.8)

## 2022-01-15 LAB — LDL CHOLESTEROL, DIRECT: LDL Direct: 73 mg/dL (ref 0–99)

## 2022-01-15 LAB — TSH: TSH: 1.27 u[IU]/mL (ref 0.450–4.500)

## 2022-03-08 ENCOUNTER — Ambulatory Visit: Payer: PPO | Admitting: Cardiovascular Disease

## 2022-03-08 ENCOUNTER — Encounter: Payer: Self-pay | Admitting: Cardiovascular Disease

## 2022-03-08 VITALS — BP 118/70 | HR 89 | Ht 68.0 in | Wt 169.0 lb

## 2022-03-08 DIAGNOSIS — I10 Essential (primary) hypertension: Secondary | ICD-10-CM

## 2022-03-08 DIAGNOSIS — E7800 Pure hypercholesterolemia, unspecified: Secondary | ICD-10-CM

## 2022-03-08 DIAGNOSIS — I4819 Other persistent atrial fibrillation: Secondary | ICD-10-CM | POA: Diagnosis not present

## 2022-03-08 DIAGNOSIS — I2510 Atherosclerotic heart disease of native coronary artery without angina pectoris: Secondary | ICD-10-CM

## 2022-03-08 DIAGNOSIS — I340 Nonrheumatic mitral (valve) insufficiency: Secondary | ICD-10-CM

## 2022-03-08 MED ORDER — METOPROLOL TARTRATE 25 MG PO TABS: 12.5000 mg | ORAL_TABLET | Freq: Two times a day (BID) | ORAL | 3 refills | Status: DC

## 2022-03-08 MED ORDER — ATORVASTATIN CALCIUM 40 MG PO TABS: 40.0000 mg | ORAL_TABLET | Freq: Every day | ORAL | 3 refills | Status: DC

## 2022-03-08 NOTE — Patient Instructions (Signed)
Medication Instructions:  Your physician has recommended you make the following change in your medication:  1.) stop aspirin  *If you need a refill on your cardiac medications before your next appointment, please call your pharmacy*   Lab Work: none If you have labs (blood work) drawn today and your tests are completely normal, you will receive your results only by: MyChart Message (if you have MyChart) OR A paper copy in the mail If you have any lab test that is abnormal or we need to change your treatment, we will call you to review the results.   Testing/Procedures: none   Follow-Up: At [facility], you and your health needs are our priority.  As part of our continuing mission to provide you with exceptional heart care, we have created designated Provider Care Teams.  These Care Teams include your primary Cardiologist (physician) and Advanced Practice Providers (APPs -  Physician Assistants and Nurse Practitioners) who all work together to provide you with the care you need, when you need it.   Your next appointment:   12 month(s)  The format for your next appointment:   In Person  Provider:   Verne Carrow, MD     Important Information About Sugar

## 2022-03-08 NOTE — Progress Notes (Signed)
Chief Complaint  Patient presents with   Follow-up    CAD    History of Present Illness: 86 yo male with history of atrial fib, CAD s/p 4V CABG 2003 (LIMA to LAD, SVG to Diagonal, SVG to OM, SVG to PDA), HLD with statin intolerance, HTN, dementia and mild mitral regurgitation who is here today for cardiac follow up. He had been followed in the past by Dr. Lia Foyer. He had an anterior MI in 1998 at which time a bare metal stent was placed in the LAD. Cardiac cath 2003 with severe stenoses in the Diagonal, OM2 and PDA. He underwent 4V CABG in 2003. He then had PCI of the mid RCA in 2008 (4.0 x 16 mm Liberte bare metal stent) and PCI of the OM1 with DES in 2011 (3.5 x 20 mm Promus Premier). I saw him in August 2014 and he had chest pain c/w unstable angina. Repeat cath 03/26/13: prox to mid LAD 60-70% (unchanged), Diagonal occluded, OM1 85-90% dist to stent, mid RCA 40%, mid RCA stent patent with 30% ISR, SVG to Diagonal patent, SVG to OM known to be occluded, SVG to PDA known to be occluded, LIMA to LAD known to be atretic, EF 55%. FFR of LAD: 0.90 (not flow limiting). 3.0 x32 mm Promus premier DES placed in the [place] overlapping old stent. 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 Echo December 2016 with normal LV function and mild MR. Event monitor December 2016 with sinus brady, PACs, no pauses or high grade AV block. He was admitted to [facility] June 2022 with dizziness and new onset atrial fibrillation. Possible TIA. He was started on ASA and Eliquis. Plavix was stopped. He cancelled a cardioversion. Echo June 2022 with LVEF=55-60%, mild LVH, Mild MR.   He is here today for follow up. The patient denies any chest pain, dyspnea, palpitations, lower extremity edema, orthopnea, PND, dizziness, near syncope or syncope. He is here with his wife. His dementia has progressed. 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   Primary  Care Physician: Zenia Resides, MD  Past Medical History:  Diagnosis Date   Atrial fibrillation [facility])    CAD (coronary artery disease)    a. Ant MI 1998;  b. 2003: s/p CABG x 4 (VG->Diag, VG->OM, VG->PDA, LIMA->LAD);  c. PCI 2008: RCA - 4.0x16 Liberte BMS;  d. 07/2010 PCI: OM1- 3.5x20 Promus DES;  e. 03/2013 Cath/PCI: LM nl, LAD 60-70p/m (FFR 0.9->Med Rx), Diag 100, LCX patent prox stent, OM1 95-90(3.0x32 Promus Premier DES), RCA dom, 53m, 30 ISR, VG->Diag ok, VG->OM KTBO, VG->PDA KTBO, LIMA->LAD known atresia, EF 55%   Dyslipidemia    Hypertension    Statin intolerance     Past Surgical History:  Procedure Laterality Date   ANKLE FRACTURE SURGERY Left ~ 1999   "broke in 1993; casted; PCP said not broken; had MI; walked for rehab; wore ankle out; put in plates and screws" ([id number])   bronchogenic cyst resection  08/06/1997   CHOLECYSTECTOMY     CORONARY ANGIOPLASTY WITH STENT PLACEMENT  1998-03/26/2013   "2 at one time in 1998; this one  today is the 3rd one I've had since [facility] in 2003" (03/26/2013)   CORONARY ANGIOPLASTY WITH STENT PLACEMENT  10/05/2006   4.44mmx 63mm Liberte non DES   CORONARY ARTERY BYPASS GRAFT  2003   x4 LIMA to LAD SVG digonal, OM, PDA  INGUINAL HERNIA REPAIR Right    "3 times" (03/26/2013)   LAPAROSCOPIC INCISIONAL / UMBILICAL / VENTRAL HERNIA REPAIR  2004   LEFT HEART CATHETERIZATION WITH CORONARY/GRAFT ANGIOGRAM N/A 03/26/2013   Procedure: LEFT HEART CATHETERIZATION WITH Beatrix Fetters;  Surgeon: Burnell Blanks, MD;  Location: [facility] CATH LAB;  Service: Cardiovascular;  Laterality: N/A;    Current Outpatient Medications  Medication Sig Dispense Refill   apixaban (ELIQUIS) 2.5 MG TABS tablet Take 1 tablet (2.5 mg total) by mouth 2 (two) times daily. 180 tablet 3   clobetasol ointment (TEMOVATE) [id number] % Apply 1 application. topically 2 (two) times daily. 30 g 0   donepezil (ARICEPT) 5 MG tablet Take 1 tablet (5 mg total) by mouth at bedtime.      nitroGLYCERIN (NITROSTAT) 0.4 MG SL tablet Place 1 tablet (0.4 mg total) under the tongue every 5 (five) minutes as needed for chest pain. 25 tablet 7   atorvastatin (LIPITOR) 40 MG tablet Take 1 tablet (40 mg total) by mouth daily. 90 tablet 3   furosemide (LASIX) 40 MG tablet Take 0.5 tablets (20 mg total) by mouth 3 (three) times a week. (Patient not taking: Reported on 03/08/2022) 90 tablet 3   metoprolol tartrate (LOPRESSOR) 25 MG tablet Take 0.5 tablets (12.5 mg total) by mouth 2 (two) times daily. 90 tablet 3   No current facility-administered medications for this visit.    No Known Allergies  Social History   Socioeconomic History   Marital status: Married    Spouse name: Not on file   Number of children: Not on file   Years of education: Not on file   Highest education level: Not on file  Occupational History   Occupation: Retired, but works on cars part-time  Tobacco Use   Smoking status: Never   Smokeless tobacco: Never  Vaping Use   Vaping Use: Never used  Substance and Sexual Activity   Alcohol use: Yes    Comment: 03/26/2013 "couple shots some weeks; none for 2 months; just take a drink q now and then"   Drug use: No   Sexual activity: Yes  Other Topics Concern   Not on file  Social History Narrative   Level of education: 10th grade   Employment: retired, previously self employed and truck Lexicographer: self    Exercise: works on his cars    Housing situation: house    Relationships (safe): yes   Sport and exercise psychologist for message (voicemail): Wife Mariann Laster) ok to leave information with her. [phone]   Lives in [place] with wife. 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   Social Determinants of Health   Financial Resource Strain: Not on file  Food Insecurity: Not on file  Transportation Needs: Not on file  Physical Activity: Not on file  Stress: Not on file  Social Connections: Not on file  Intimate Partner Violence: Not on file    Family History  Problem  Relation Age of Onset   Hypertension Other    Heart disease Other    Kidney cancer Other     Review of Systems:  As stated in the HPI and otherwise negative.   BP 118/70   Pulse 89   Ht 5\' 8"  (1.727 m)   Wt 169 lb (76.7 kg)   SpO2 98%   BMI 25.70 kg/m   Physical Examination:  General: Well developed, well nourished, NAD  HEENT: OP clear, mucus membranes moist  SKIN: warm, dry. No rashes. Neuro: No  focal deficits  Musculoskeletal: Muscle strength 5/5 all ext  Psychiatric: Mood and affect normal  Neck: No JVD, no carotid bruits, no thyromegaly, no lymphadenopathy.  Lungs:Clear bilaterally, no wheezes, rhonci, crackles Cardiovascular: Irregular irregular. No murmurs, gallops or rubs. Abdomen:Soft. Bowel sounds present. Non-tender.  Extremities: No lower extremity edema. Pulses are 2 + in the bilateral DP/PT.  Echo June 2022:  1. Left ventricular ejection fraction, by estimation, is 55 to 60%. The  left ventricle has normal function. The left ventricle has no regional  wall motion abnormalities. There is mild concentric left ventricular  hypertrophy. Left ventricular diastolic  function could not be evaluated.   2. Right ventricular systolic function is low normal. The right  ventricular size is mildly enlarged. There is normal pulmonary artery  systolic pressure. The estimated right ventricular systolic pressure is  20.5 mmHg.   3. Right atrial size was mildly dilated.   4. The mitral valve is grossly normal. Mild mitral valve regurgitation.  No evidence of mitral stenosis.   5. The aortic valve is tricuspid. Aortic valve regurgitation is trivial.  No aortic stenosis is present.   6. The inferior vena cava is normal in size with greater than 50%  respiratory variability, suggesting right atrial pressure of 3 mmHg.   Cardiac cath 03/26/13: Hemodynamic Findings:  Central aortic pressure: 118/59  Left ventricular pressure: 121/8/12  Angiographic Findings:  Left main: No  obstructive disease.  Left Anterior Descending Artery: Moderate to large caliber vessel that courses to the apex. The proximal to mid vessel stented segment with 60-70% restenosis, grossly unchanged from last cath in 2011. The distal vessel has diffuse mild stenosis. The diagonal branch is occluded but fills from the patent vein graft.  Circumflex Artery: Large caliber vessel with patent stent in proximal vessel extending into OM1. The first OM branch has a hazy, 85-90% stenosis distal to the stent. The AV groove Circumflex is small in caliber and has mild plaque disease.  Right Coronary Artery: Large dominant vessel with 40% mid stenosis, patent mid stent with 30% restenosis, mild distal disease.  Graft Anatomy:  SVG to Diagonal is patent  SVG to OM known to be occluded and not injected  SVG to PDA known to be occluded and not injected  LIMA to mid LAD is known to be atretic and not injected  Left Ventricular Angiogram: LVEF=55%. 1+ MR  Impression:  1. Triple vessel CAD s/p CABG with 1/4 patent grafts, patent stents LAD and RCA.  2. Unstable angina (class III) secondary to severe stenosis OM1  3. Successful PTCA/DES x 1 OM1  4. Moderate disease stented segment of proximal LAD, FFR of 0.90 suggesting this lesion is not flow limiting.  5. Preserved LV systolic function  EKG:  EKG is ordered today. The ekg ordered today demonstrates atrial fib, rate 89 bpm. Chronic T wave abn  Recent Labs: 03/26/2021: NT-Pro BNP 26 01/14/2022: ALT 35; BUN 18; Creatinine, Ser 1.54; Hemoglobin 14.5; Platelets 176; Potassium 4.0; Sodium 139; TSH 1.270   Lipid Panel Lipid Panel     Component Value Date/Time   CHOL 128 04/22/2021 0943   TRIG 62 04/22/2021 0943   HDL 41 04/22/2021 0943   CHOLHDL 3.1 04/22/2021 0943   CHOLHDL 4.4 01/31/2021 0603   VLDL 15 01/31/2021 0603   LDLCALC 74 04/22/2021 0943   LDLDIRECT 73 01/14/2022 1115     Wt Readings from Last 3 Encounters:  03/08/22 169 lb (76.7 kg)  01/14/22  171 lb 6.4 oz (77.7  kg)  10/28/21 172 lb 12.8 oz (78.4 kg)     Other studies Reviewed: Additional studies/ records that were reviewed today include: . Review of the above records demonstrates:    Assessment and Plan:   1. CAD without angina: No chest pain. He is known to have diffuse 3 vessel CAD s/p CABG and PCI. Last PCI was in 2014. One of 4 bypass grafts patent at time of last cath. Continue statin and beta blocker. Will stop ASA to lower bleeding risk since he is on Eliquis.   2. HTN: BP is well controlled. Continue current therapy  3. HLD: LDL near goal in June 2023. Continue statin.   4. Mitral regurgitation: Mild by echo June 2022  5. Atrial fibrillation, persistent: He is in atrial fib today with well controlled heart rate. Will continue beta blocker and Eliquis.    Current medicines are reviewed at length with the patient today.  The patient does not have concerns regarding medicines.  The following changes have been made:  no change  Labs/ tests ordered today include:   Orders Placed This Encounter  Procedures   EKG 12-Lead    Disposition:   F/U with me in12 months  Signed, Verne Carrow, MD 03/08/2022 3:20 PM    [facility] [address] [place], [place], [place]  [address] Phone: [phone]; Fax: [phone]

## 2022-05-20 ENCOUNTER — Ambulatory Visit: Payer: PPO | Admitting: Family Medicine

## 2022-05-31 ENCOUNTER — Ambulatory Visit (INDEPENDENT_AMBULATORY_CARE_PROVIDER_SITE_OTHER): Payer: PPO | Admitting: Family Medicine

## 2022-05-31 ENCOUNTER — Encounter: Payer: Self-pay | Admitting: Family Medicine

## 2022-05-31 VITALS — BP 136/84 | HR 78 | Ht 68.0 in | Wt 165.6 lb

## 2022-05-31 DIAGNOSIS — N401 Enlarged prostate with lower urinary tract symptoms: Secondary | ICD-10-CM

## 2022-05-31 DIAGNOSIS — Z23 Encounter for immunization: Secondary | ICD-10-CM | POA: Diagnosis not present

## 2022-05-31 DIAGNOSIS — R531 Weakness: Secondary | ICD-10-CM

## 2022-05-31 DIAGNOSIS — G3184 Mild cognitive impairment, so stated: Secondary | ICD-10-CM

## 2022-05-31 DIAGNOSIS — I482 Chronic atrial fibrillation, unspecified: Secondary | ICD-10-CM | POA: Diagnosis not present

## 2022-05-31 DIAGNOSIS — I10 Essential (primary) hypertension: Secondary | ICD-10-CM | POA: Diagnosis not present

## 2022-05-31 DIAGNOSIS — N400 Enlarged prostate without lower urinary tract symptoms: Secondary | ICD-10-CM | POA: Insufficient documentation

## 2022-05-31 MED ORDER — TAMSULOSIN HCL 0.4 MG PO CAPS: 0.4000 mg | ORAL_CAPSULE | Freq: Every day | ORAL | 3 refills | Status: DC

## 2022-05-31 MED ORDER — DONEPEZIL HCL 10 MG PO TABS: 10.0000 mg | ORAL_TABLET | Freq: Every day | ORAL | 3 refills | Status: DC

## 2022-05-31 NOTE — Assessment & Plan Note (Signed)
Good control on current meds.  No change. 

## 2022-05-31 NOTE — Progress Notes (Signed)
    SUBJECTIVE:   CHIEF COMPLAINT / HPI:   Patient C/O weakness and easy fatigability.  With his memory loss, he sees this as a new problem but wife confirms this is longstanding.   No recent worsening.   2. Known CAD.  Recent visit to cards.  Only change was to drop ASA since now on eliquis.  Felt volume status was OK.   3. Memory decline continues.  Wife and patient agreed to try higher dose on Donipezil. 4. Screen positive for depression.  Mood good in office and at home per wife.  He is frustrated about recently losing his job.  Actually lost his job several years ago.  Screen + is likely a false pos due to cognitive impairment 5. C/O more difficulty is starting urination and weak stream.       OBJECTIVE:   BP 136/84 (BP Location: Right Leg, Cuff Size: Normal)   Pulse 78   Ht 5\' 8"  (1.727 m)   Wt 165 lb 9.6 oz (75.1 kg)   SpO2 99%   BMI 25.18 kg/m   Lungs clear Cardiac RRR without m or g Ext no edema.  ASSESSMENT/PLAN:   BPH (benign prostatic hyperplasia) Trial of flomax  Weakness His complaint of weakness is likely best explained by his mild cognitive impairment.  HYPERTENSION, BENIGN Good control on current meds.  No change.  Chronic atrial fibrillation (HCC) Rate controled and on DOAC.       Zenia Resides, MD [place]

## 2022-05-31 NOTE — Assessment & Plan Note (Signed)
Rate controled and on DOAC.

## 2022-05-31 NOTE — Patient Instructions (Addendum)
I am increasing his memory medication.  Please let me know if that helps.  I will be happy to send a three month supply to the mail order I also sent in a medication to help with urination.   Neither of these medicines cure anything.  They are supposed to make you feel better.  If they working, let me know and we will stop them.

## 2022-05-31 NOTE — Assessment & Plan Note (Signed)
His complaint of weakness is likely best explained by his mild cognitive impairment.

## 2022-05-31 NOTE — Assessment & Plan Note (Signed)
Trial of flomax

## 2022-08-24 ENCOUNTER — Other Ambulatory Visit: Payer: Self-pay

## 2022-08-24 MED ORDER — APIXABAN 2.5 MG PO TABS: 2.5000 mg | ORAL_TABLET | Freq: Two times a day (BID) | ORAL | 3 refills | Status: DC

## 2022-08-24 NOTE — Telephone Encounter (Signed)
Prescription refill request for Eliquis received. Indication:Afib Last office visit:7/23 Scr:1.5 Age: 87 Weight:75.1 kg  Prescription refilled

## 2022-09-01 ENCOUNTER — Encounter: Payer: Self-pay | Admitting: Family Medicine

## 2022-09-01 ENCOUNTER — Ambulatory Visit (INDEPENDENT_AMBULATORY_CARE_PROVIDER_SITE_OTHER): Payer: PPO | Admitting: Family Medicine

## 2022-09-01 VITALS — BP 134/86 | HR 59 | Ht 68.0 in | Wt 163.2 lb

## 2022-09-01 DIAGNOSIS — I25708 Atherosclerosis of coronary artery bypass graft(s), unspecified, with other forms of angina pectoris: Secondary | ICD-10-CM | POA: Diagnosis not present

## 2022-09-01 DIAGNOSIS — G3184 Mild cognitive impairment, so stated: Secondary | ICD-10-CM | POA: Diagnosis not present

## 2022-09-01 DIAGNOSIS — I482 Chronic atrial fibrillation, unspecified: Secondary | ICD-10-CM

## 2022-09-01 DIAGNOSIS — N183 Chronic kidney disease, stage 3 unspecified: Secondary | ICD-10-CM | POA: Diagnosis not present

## 2022-09-01 DIAGNOSIS — I25118 Atherosclerotic heart disease of native coronary artery with other forms of angina pectoris: Secondary | ICD-10-CM

## 2022-09-01 DIAGNOSIS — R2689 Other abnormalities of gait and mobility: Secondary | ICD-10-CM | POA: Insufficient documentation

## 2022-09-01 MED ORDER — DONEPEZIL HCL 10 MG PO TABS: 10.0000 mg | ORAL_TABLET | Freq: Every day | ORAL | 3 refills | Status: DC

## 2022-09-01 MED ORDER — NITROGLYCERIN 0.4 MG SL SUBL: 0.4000 mg | SUBLINGUAL_TABLET | SUBLINGUAL | 7 refills | Status: DC | PRN

## 2022-09-01 NOTE — Assessment & Plan Note (Signed)
Stable on current meds 

## 2022-09-01 NOTE — Assessment & Plan Note (Signed)
Recheck creat

## 2022-09-01 NOTE — Assessment & Plan Note (Signed)
Stable without chest pain 

## 2022-09-01 NOTE — Assessment & Plan Note (Signed)
Might be early Parkinsons but no other stigmata.  Wife will consider if she wants PT or neuro referral.

## 2022-09-01 NOTE — Patient Instructions (Signed)
I will call tomorrow with lab results.  Tell me if you want either a neurology or physical therapy referral for the shuffling gait. Try the higher 10 mg dose of the donepezil.  The main side effect to look for is loose stools.  Also see if it works.   Recheck in about six months if things are going well.  Dr. Ky Barban will be taking my place.  She is younger and quite good.  Please give her a try.  It has been a pleasure taking care of you.

## 2022-09-01 NOTE — Progress Notes (Signed)
    SUBJECTIVE:   CHIEF COMPLAINT / HPI:   Multiple concerns Patient complains of being tired all the time.  No change in appetite, bowel or bladder.  Known CAD.  No Chest pain or DOE.  Reviewed reassuring lab work last done in June. Memory loss.  Wife giving him 5 mg donepezil.  Has not yet tried 10 mg.  Feels it helps a little.  No side effects noted. CKD.  Last creat checked 6 months ago. Shuffling gait.  He and wife both notice a narrow shuffling gait.  No falls.  No tremor Chronic a fib.  On metoprolol and apixiban.  No bleeding.   CAD on atorvastatin and apixaban.    OBJECTIVE:   BP 134/86   Pulse (!) 59   Ht 5\' 8"  (1.727 m)   Wt 163 lb 3.2 oz (74 kg)   SpO2 98%   BMI 24.81 kg/m   No tremor.  No masked faces.   Good color and normal gen appearance Lungs clear Cardiac RRR without m or g  ASSESSMENT/PLAN:   Mild cognitive impairment Trial of increased donepezil.  Shuffling gait Might be early Parkinsons but no other stigmata.  Wife will consider if she wants PT or neuro referral.  Coronary artery disease Stable without chest pain.  CKD (chronic kidney disease) stage 3, GFR 30-59 ml/min (HCC) Recheck creat  Chronic atrial fibrillation (HCC) Stable on current meds.     Zenia Resides, MD [place]

## 2022-09-01 NOTE — Assessment & Plan Note (Signed)
Trial of increased donepezil.

## 2022-09-02 LAB — BASIC METABOLIC PANEL
BUN/Creatinine Ratio: 11 (ref 10–24)
BUN: 15 mg/dL (ref 8–27)
CO2: 20 mmol/L (ref 20–29)
Calcium: 9.4 mg/dL (ref 8.6–10.2)
Chloride: 103 mmol/L (ref 96–106)
Creatinine, Ser: 1.35 mg/dL — ABNORMAL HIGH (ref 0.76–1.27)
Glucose: 88 mg/dL (ref 70–99)
Potassium: 4.3 mmol/L (ref 3.5–5.2)
Sodium: 140 mmol/L (ref 134–144)
eGFR: 52 mL/min/{1.73_m2} — ABNORMAL LOW (ref >59)

## 2022-09-14 ENCOUNTER — Encounter: Payer: Self-pay | Admitting: Family Medicine

## 2022-09-16 ENCOUNTER — Encounter (HOSPITAL_COMMUNITY): Payer: Self-pay | Admitting: *Deleted

## 2022-10-07 DIAGNOSIS — H2513 Age-related nuclear cataract, bilateral: Secondary | ICD-10-CM | POA: Diagnosis not present

## 2022-10-12 ENCOUNTER — Telehealth: Payer: Self-pay | Admitting: Family Medicine

## 2022-10-12 NOTE — Telephone Encounter (Signed)
Contacted Steve Gordon to schedule their annual wellness visit. Call back at later date: April,2024  Pt's brother-in-law is in ICU.  Thank you,  [place] Direct dial  [phone]

## 2022-12-09 ENCOUNTER — Telehealth: Payer: Self-pay | Admitting: Family Medicine

## 2022-12-09 NOTE — Telephone Encounter (Signed)
Contacted Steve Gordon to schedule their annual wellness visit. Appointment made for 12/13/2022.  Thank you,  [facility] Support [facility] Direct dial  [phone]

## 2022-12-12 NOTE — Patient Instructions (Signed)
Health Maintenance, Male Adopting a healthy lifestyle and getting preventive care are important in promoting health and wellness. Ask your health care provider about: The right schedule for you to have regular tests and exams. Things you can do on your own to prevent diseases and keep yourself healthy. What should I know about diet, weight, and exercise? Eat a healthy diet  Eat a diet that includes plenty of vegetables, fruits, low-fat dairy products, and lean protein. Do not eat a lot of foods that are high in solid fats, added sugars, or sodium. Maintain a healthy weight Body mass index (BMI) is a measurement that can be used to identify possible weight problems. It estimates body fat based on height and weight. Your health care provider can help determine your BMI and help you achieve or maintain a healthy weight. Get regular exercise Get regular exercise. This is one of the most important things you can do for your health. Most adults should: Exercise for at least 150 minutes each week. The exercise should increase your heart rate and make you sweat (moderate-intensity exercise). Do strengthening exercises at least twice a week. This is in addition to the moderate-intensity exercise. Spend less time sitting. Even light physical activity can be beneficial. Watch cholesterol and blood lipids Have your blood tested for lipids and cholesterol at 85 years of age, then have this test every 5 years. You may need to have your cholesterol levels checked more often if: Your lipid or cholesterol levels are high. You are older than 85 years of age. You are at high risk for heart disease. What should I know about cancer screening? Many types of cancers can be detected early and may often be prevented. Depending on your health history and family history, you may need to have cancer screening at various ages. This may include screening for: Colorectal cancer. Prostate cancer. Skin cancer. Lung  cancer. What should I know about heart disease, diabetes, and high blood pressure? Blood pressure and heart disease High blood pressure causes heart disease and increases the risk of stroke. This is more likely to develop in people who have high blood pressure readings or are overweight. Talk with your health care provider about your target blood pressure readings. Have your blood pressure checked: Every 3-5 years if you are 18-39 years of age. Every year if you are 40 years old or older. If you are between the ages of 65 and 75 and are a current or former smoker, ask your health care provider if you should have a one-time screening for abdominal aortic aneurysm (AAA). Diabetes Have regular diabetes screenings. This checks your fasting blood sugar level. Have the screening done: Once every three years after age 45 if you are at a normal weight and have a low risk for diabetes. More often and at a younger age if you are overweight or have a high risk for diabetes. What should I know about preventing infection? Hepatitis B If you have a higher risk for hepatitis B, you should be screened for this virus. Talk with your health care provider to find out if you are at risk for hepatitis B infection. Hepatitis C Blood testing is recommended for: Everyone born from 1945 through 1965. Anyone with known risk factors for hepatitis C. Sexually transmitted infections (STIs) You should be screened each year for STIs, including gonorrhea and chlamydia, if: You are sexually active and are younger than 85 years of age. You are older than 85 years of age and your   health care provider tells you that you are at risk for this type of infection. Your sexual activity has changed since you were last screened, and you are at increased risk for chlamydia or gonorrhea. Ask your health care provider if you are at risk. Ask your health care provider about whether you are at high risk for HIV. Your health care provider  may recommend a prescription medicine to help prevent HIV infection. If you choose to take medicine to prevent HIV, you should first get tested for HIV. You should then be tested every 3 months for as long as you are taking the medicine. Follow these instructions at home: Alcohol use Do not drink alcohol if your health care provider tells you not to drink. If you drink alcohol: Limit how much you have to 0-2 drinks a day. Know how much alcohol is in your drink. In the [place] one drink equals one 12 oz bottle of beer (355 mL), one 5 oz glass of wine (148 mL), or one 1 oz glass of hard liquor (44 mL). Lifestyle Do not use any products that contain nicotine or tobacco. These products include cigarettes, chewing tobacco, and vaping devices, such as e-cigarettes. If you need help quitting, ask your health care provider. Do not use street drugs. Do not share needles. Ask your health care provider for help if you need support or information about quitting drugs. General instructions Schedule regular health, dental, and eye exams. Stay current with your vaccines. Tell your health care provider if: You often feel depressed. You have ever been abused or do not feel safe at home. Summary Adopting a healthy lifestyle and getting preventive care are important in promoting health and wellness. Follow your health care provider's instructions about healthy diet, exercising, and getting tested or screened for diseases. Follow your health care provider's instructions on monitoring your cholesterol and blood pressure. This information is not intended to replace advice given to you by your health care provider. Make sure you discuss any questions you have with your health care provider. Document Revised: 12/15/2020 Document Reviewed: 12/15/2020 Elsevier Patient Education  2023 [employer]  

## 2022-12-12 NOTE — Progress Notes (Signed)
I connected with  ADON SENN, his wife  Hilary Murdick on 12/13/2022 by a audio enabled telemedicine application and verified that I am speaking with the correct person using two identifiers.  Patient Location: Home  Provider Location: Home Office  I discussed the limitations of evaluation and management by telemedicine. The patient expressed understanding and agreed to proceed.   Subjective:   Steve Gordon is a 87 y.o. male who presents for Medicare Annual/Subsequent preventive examination.  Review of Systems    Per HPI unless specifically indicated below.  Cardiac Risk Factors include: advanced age (>24men, >24 women);male gender, Hypertension, and Hyperlipidemia.          Objective:       09/01/2022    9:47 AM 05/31/2022   10:33 AM 05/31/2022    9:59 AM  Vitals with BMI  Height 5\' 8"   5\' 8"   Weight 163 lbs 3 oz  165 lbs 10 oz  BMI 24.82  25.19  Systolic 134 136 811  Diastolic 86 84 90  Pulse 59  78    There were no vitals filed for this visit. There is no height or weight on file to calculate BMI.     12/13/2022    9:16 AM 09/01/2022    9:54 AM 05/31/2022    9:59 AM 01/14/2022   10:05 AM 10/28/2021    2:49 PM 01/31/2021    3:00 AM 10/15/2019   10:34 AM  Advanced Directives  Does Patient Have a Medical Advance Directive? Yes Yes No No No No No  Type of Estate agent of [place];Living will Living will;Healthcare Power of Attorney       Does patient want to make changes to medical advance directive? No - Patient declined        Copy of Healthcare Power of Attorney in Chart? No - copy requested No - copy requested       Would patient like information on creating a medical advance directive?   No - Patient declined  No - Patient declined No - Patient declined No - Patient declined    Current Medications (verified) Outpatient Encounter Medications as of 12/13/2022  Medication Sig   apixaban (ELIQUIS) 2.5 MG TABS tablet Take 1 tablet (2.5 mg  total) by mouth 2 (two) times daily.   atorvastatin (LIPITOR) 40 MG tablet Take 1 tablet (40 mg total) by mouth daily.   clobetasol ointment (TEMOVATE) 0.05 % Apply 1 application. topically 2 (two) times daily.   donepezil (ARICEPT) 10 MG tablet Take 1 tablet (10 mg total) by mouth at bedtime.   metoprolol tartrate (LOPRESSOR) 25 MG tablet Take 0.5 tablets (12.5 mg total) by mouth 2 (two) times daily.   nitroGLYCERIN (NITROSTAT) 0.4 MG SL tablet Place 1 tablet (0.4 mg total) under the tongue every 5 (five) minutes as needed for chest pain.   tamsulosin (FLOMAX) 0.4 MG CAPS capsule Take 1 capsule (0.4 mg total) by mouth daily.   furosemide (LASIX) 40 MG tablet Take 0.5 tablets (20 mg total) by mouth 3 (three) times a week. (Patient not taking: Reported on 12/13/2022)   No facility-administered encounter medications on file as of 12/13/2022.    Allergies (verified) Patient has no known allergies.   History: Past Medical History:  Diagnosis Date   Atrial fibrillation (HCC)    CAD (coronary artery disease)    a. Ant MI 1998;  b. 2003: s/p CABG x 4 (VG->Diag, VG->OM, VG->PDA, LIMA->LAD);  c. PCI 2008: RCA -  4.0x16 Liberte BMS;  d. 07/2010 PCI: OM1- 3.5x20 Promus DES;  e. 03/2013 Cath/PCI: LM nl, LAD 60-70p/m (FFR 0.9->Med Rx), Diag 100, LCX patent prox stent, OM1 95-90(3.0x32 Promus Premier DES), RCA dom, 47m, 30 ISR, VG->Diag ok, VG->OM KTBO, VG->PDA KTBO, LIMA->LAD known atresia, EF 55%   Dyslipidemia    Hypertension    Statin intolerance    Past Surgical History:  Procedure Laterality Date   ANKLE FRACTURE SURGERY Left ~ 1999   "broke in 1993; casted; PCP said not broken; had MI; walked for rehab; wore ankle out; put in plates and screws" (03/26/2013)   bronchogenic cyst resection  08/06/1997   CHOLECYSTECTOMY     CORONARY ANGIOPLASTY WITH STENT PLACEMENT  1998-03/26/2013   "2 at one time in 1998; this one  today is the 3rd one I've had since [facility] in 2003" (03/26/2013)   CORONARY ANGIOPLASTY  WITH STENT PLACEMENT  10/05/2006   4.54mmx 16mm Liberte non DES   CORONARY ARTERY BYPASS GRAFT  2003   x4 LIMA to LAD SVG digonal, OM, PDA   INGUINAL HERNIA REPAIR Right    "3 times" (03/26/2013)   LAPAROSCOPIC INCISIONAL / UMBILICAL / VENTRAL HERNIA REPAIR  2004   LEFT HEART CATHETERIZATION WITH CORONARY/GRAFT ANGIOGRAM N/A 03/26/2013   Procedure: LEFT HEART CATHETERIZATION WITH Isabel Caprice;  Surgeon: Kathleene Hazel, MD;  Location: [facility] CATH LAB;  Service: Cardiovascular;  Laterality: N/A;   Family History  Problem Relation Age of Onset   Hypertension Other    Heart disease Other    Kidney cancer Other    Social History   Socioeconomic History   Marital status: Married    Spouse name: Not on file   Number of children: 2   Years of education: Not on file   Highest education level: Not on file  Occupational History   Occupation: Retired, but works on cars part-time  Tobacco Use   Smoking status: Never   Smokeless tobacco: Never  Vaping Use   Vaping Use: Never used  Substance and Sexual Activity   Alcohol use: Not Currently    Comment: 03/26/2013 "couple shots some weeks; none for 2 months; just take a drink q now and then"   Drug use: No   Sexual activity: Yes  Other Topics Concern   Not on file  Social History Narrative   Level of education: 10th grade   Employment: retired, 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 [phone]   Lives in [place] with wife. 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   Social Determinants of Health   Financial Resource Strain: Low Risk  (12/13/2022)   Overall Financial Resource Strain (CARDIA)    Difficulty of Paying Living Expenses: Not hard at all  Food Insecurity: No Food Insecurity (12/13/2022)   Hunger Vital Sign    Worried About  Running Out of Food in the Last Year: Never true    Ran Out of Food in the Last Year: Never true  Transportation Needs: No Transportation Needs (12/13/2022)   PRAPARE - Administrator, Civil Service (Medical): No    Lack of Transportation (Non-Medical): No  Physical Activity: Insufficiently Active (12/13/2022)   Exercise Vital Sign    Days of Exercise per Week: 1 day  Minutes of Exercise per Session: 20 min  Stress: No Stress Concern Present (12/13/2022)   [employer] of Occupational Health - Occupational Stress Questionnaire    Feeling of Stress : Not at all  Social Connections: Moderately Isolated (12/13/2022)   Social Connection and Isolation Panel [NHANES]    Frequency of Communication with Friends and Family: More than three times a week    Frequency of Social Gatherings with Friends and Family: More than three times a week    Attends Religious Services: Never    Database administrator or Organizations: Patient unable to answer    Attends Banker Meetings: Never    Marital Status: Married    Tobacco Counseling Counseling given: No   Clinical Intake:  Pre-visit preparation completed: No  Pain : No/denies pain     Nutritional Status: BMI of 19-24  Normal Nutritional Risks: Nausea/ vomitting/ diarrhea Diabetes: No  How often do you need to have someone help you when you read instructions, pamphlets, or other written materials from your doctor or pharmacy?: 1 - Never  Diabetic?No  Interpreter Needed?: No  Information entered by :: Laurel Dimmer, CMA   Activities of Daily Living    12/13/2022    9:01 AM  In your present state of health, do you have any difficulty performing the following activities:  Hearing? 1  Comment hearing aids  Vision? 1  Comment glasses, [facility]  Difficulty concentrating or making decisions? 1  Walking or climbing stairs? 0  Dressing or bathing? 0  Doing errands, shopping? 0    Patient Care  Team: McDiarmid, [name], MD as PCP - General (Family Medicine) Kathleene Hazel, MD as PCP - Cardiology (Cardiology)  Indicate any recent Medical Services you may have received from other than [facility] providers in the past year (date may be approximate).     Assessment:   This is a routine wellness examination for Arion.   Hearing/Vision screen Hearing loss, bilateral hearing aids  Denies any change to her vision. Wear glasses, Annual Eye Exam [facility] .   Dietary issues and exercise activities discussed: Current Exercise Habits: The patient does not participate in regular exercise at present, Exercise limited by: None identified   Goals Addressed   None    Depression Screen    09/01/2022    9:51 AM 05/31/2022   10:09 AM 01/14/2022   10:34 AM 10/28/2021    3:18 PM 10/15/2019   10:34 AM 09/26/2019    3:53 PM 12/13/2018    2:03 PM  PHQ 2/9 Scores  PHQ - 2 Score 3 5 0 0 0 0 0  PHQ- 9 Score 6 15 5  0       Fall Risk    12/13/2022    9:01 AM 09/01/2022    9:51 AM 05/31/2022    9:59 AM 10/28/2021    2:48 PM 10/15/2019   10:33 AM  Fall Risk   Falls in the past year? 0 0 0 0 0  Number falls in past yr: 0 0 0 0   Injury with Fall? 0      Risk for fall due to : No Fall Risks      Follow up Falls evaluation completed        FALL RISK PREVENTION PERTAINING TO THE HOME:  Any stairs in or around the home? Yes  If so, are there any without handrails? No  Home free of loose throw rugs in walkways, pet beds,  electrical cords, etc? Yes  Adequate lighting in your home to reduce risk of falls? Yes   ASSISTIVE DEVICES UTILIZED TO PREVENT FALLS:  Life alert? No  Use of a cane, walker or w/c? No  Grab bars in the bathroom? No  Shower chair or bench in shower? No  Elevated toilet seat or a handicapped toilet? Yes   TIMED UP AND GO:  Was the test performed?Unable to perform, virtual appointment   Cognitive Function:        12/13/2022    9:07 AM  6CIT Screen  What  Year? 4 points  What month? 0 points  What time? 0 points  Count back from 20 0 points  Months in reverse 0 points  Repeat phrase 10 points  Total Score 14 points    Immunizations Immunization History  Administered Date(s) Administered   Fluad Quad(high Dose 65+) 05/31/2022   Influenza-Unspecified 05/09/2018   Pneumococcal Conjugate-13 11/12/2015   Pneumococcal Polysaccharide-23 12/13/2018   Tdap 11/12/2015    TDAP status: Up to date  Flu Vaccine status: Up to date  Pneumococcal vaccine status: Up to date  Covid-19 vaccine status: Information provided on how to obtain vaccines.   Qualifies for Shingles Vaccine? Yes   Zostavax completed No   Shingrix Completed?: No.    Education has been provided regarding the importance of this vaccine. Patient has been advised to call insurance company to determine out of pocket expense if they have not yet received this vaccine. Advised may also receive vaccine at local pharmacy or Health Dept. Verbalized acceptance and understanding.  Screening Tests Health Maintenance  Topic Date Due   COVID-19 Vaccine (1) Never done   Zoster Vaccines- Shingrix (1 of 2) Never done   INFLUENZA VACCINE  03/10/2023   Medicare Annual Wellness (AWV)  12/13/2023   DTaP/Tdap/Td (2 - Td or Tdap) 11/11/2025   Pneumonia Vaccine 42+ Years old  Completed   HPV VACCINES  Aged Out    Health Maintenance  Health Maintenance Due  Topic Date Due   COVID-19 Vaccine (1) Never done   Zoster Vaccines- Shingrix (1 of 2) Never done    Colorectal cancer screening: No longer required.   Lung Cancer Screening: (Low Dose CT Chest recommended if Age 5-80 years, 30 pack-year currently smoking OR have quit w/in 15years.) does not qualify.   Lung Cancer Screening Referral: not applicable   Additional Screening:  Hepatitis C Screening: does not qualify  Vision Screening: Recommended annual ophthalmology exams for early detection of glaucoma and other disorders of the  eye. Is the patient up to date with their annual eye exam?  Yes  Who is the provider or what is the name of the office in which the patient attends annual eye exams? [facility]  If pt is not established with a provider, would they like to be referred to a provider to establish care? No .   Dental Screening: Recommended annual dental exams for proper oral hygiene  Community Resource Referral / Chronic Care Management: CRR required this visit?  No   CCM required this visit?  No      Plan:     I have personally reviewed and noted the following in the patient's chart:   Medical and social history Use of alcohol, tobacco or illicit drugs  Current medications and supplements including opioid prescriptions. Patient is not currently taking opioid prescriptions. Functional ability and status Nutritional status Physical activity Advanced directives List of other physicians Hospitalizations, surgeries, and ER  visits in previous 12 months Vitals Screenings to include cognitive, depression, and falls Referrals and appointments  In addition, I have reviewed and discussed with patient certain preventive protocols, quality metrics, and best practice recommendations. A written personalized care plan for preventive services as well as general preventive health recommendations were provided to patient.    Mr. Freni , Thank you for taking time to come for your Medicare Wellness Visit. I appreciate your ongoing commitment to your health goals. Please review the following plan we discussed and let me know if I can assist you in the future.   These are the goals we discussed:  Goals   None     This is a list of the screening recommended for you and due dates:  Health Maintenance  Topic Date Due   COVID-19 Vaccine (1) Never done   Zoster (Shingles) Vaccine (1 of 2) Never done   Flu Shot  03/10/2023   Medicare Annual Wellness Visit  12/13/2023   DTaP/Tdap/Td vaccine (2 - Td or  Tdap) 11/11/2025   Pneumonia Vaccine  Completed   HPV Vaccine  Aged Out     [place], [place]   12/13/2022  Nurse Notes: Approximately 30 minute Non-Face -To-Face Medicare Wellness Visit

## 2022-12-13 ENCOUNTER — Ambulatory Visit (INDEPENDENT_AMBULATORY_CARE_PROVIDER_SITE_OTHER): Payer: PPO

## 2022-12-13 DIAGNOSIS — Z0000 Encounter for general adult medical examination without abnormal findings: Secondary | ICD-10-CM | POA: Diagnosis not present

## 2022-12-20 NOTE — Progress Notes (Signed)
Reviewed and agree with Dr Koval's plan.   

## 2023-01-12 ENCOUNTER — Encounter: Payer: Self-pay | Admitting: Family Medicine

## 2023-01-12 ENCOUNTER — Encounter: Payer: Self-pay | Admitting: Cardiovascular Disease

## 2023-01-13 ENCOUNTER — Encounter: Payer: Self-pay | Admitting: Family Medicine

## 2023-01-14 ENCOUNTER — Telehealth: Payer: Self-pay | Admitting: Family Medicine

## 2023-01-14 DIAGNOSIS — F03B11 Unspecified dementia, moderate, with agitation: Secondary | ICD-10-CM

## 2023-01-14 MED ORDER — BREXPIPRAZOLE 0.5 MG PO TABS: 0.5000 mg | ORAL_TABLET | Freq: Every day | ORAL | 0 refills | Status: DC
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
## 2023-01-17 ENCOUNTER — Encounter: Payer: Self-pay | Admitting: Family Medicine

## 2023-01-18 ENCOUNTER — Telehealth: Payer: Self-pay | Admitting: Family Medicine

## 2023-01-18 DIAGNOSIS — F03B11 Unspecified dementia, moderate, with agitation: Secondary | ICD-10-CM

## 2023-01-18 MED ORDER — RISPERIDONE 0.5 MG PO TBDP: 0.5000 mg | ORAL_TABLET | Freq: Every day | ORAL | 0 refills | Status: DC

## 2023-01-18 NOTE — Telephone Encounter (Signed)
I spoke with Steve Gordon about options beside the expensive brexiprazole for her husbands agitation.    We discussed the use of low dose risperidone given its minimal QT prolongation and lower anticholingergic sdie effect.   Will message Dr Allena Katz (Pharm) to see if it is okay to try a 0.5 mg dose daily would be relatively safe.   I will follow up Steve Gordon in the office 01/20/23 to assess benefit and adverse effects of new medication.  Will revisit the risks of antipsychotics for BPSD with Steve Gordon at the visit.

## 2023-01-20 ENCOUNTER — Encounter: Payer: Self-pay | Admitting: Family Medicine

## 2023-01-20 ENCOUNTER — Ambulatory Visit (INDEPENDENT_AMBULATORY_CARE_PROVIDER_SITE_OTHER): Payer: PPO | Admitting: Family Medicine

## 2023-01-20 VITALS — BP 159/98 | HR 77 | Ht 68.0 in | Wt 162.2 lb

## 2023-01-20 DIAGNOSIS — F03B11 Unspecified dementia, moderate, with agitation: Secondary | ICD-10-CM | POA: Diagnosis not present
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
## 2023-01-20 NOTE — Patient Instructions (Signed)
Try Imodium AD one tablet thirty minutes before meal.   Try taking the risperidone 0.5 mg tablet, one tablet daily.    Dr Kindsey Eblin will give a call to see how you are in 3 weeks.
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
## 2023-01-21 ENCOUNTER — Encounter: Payer: Self-pay | Admitting: Family Medicine

## 2023-02-12 ENCOUNTER — Encounter: Payer: Self-pay | Admitting: Family Medicine

## 2023-02-12 DIAGNOSIS — F03B11 Unspecified dementia, moderate, with agitation: Secondary | ICD-10-CM

## 2023-02-14 ENCOUNTER — Encounter: Payer: Self-pay | Admitting: Family Medicine

## 2023-02-14 DIAGNOSIS — F03B11 Unspecified dementia, moderate, with agitation: Secondary | ICD-10-CM

## 2023-02-14 MED ORDER — RISPERIDONE 0.5 MG PO TBDP
1.0000 mg | ORAL_TABLET | Freq: Every day | ORAL | 0 refills | Status: DC
Start: 2023-02-14 — End: 2023-02-18

## 2023-02-14 MED ORDER — RISPERIDONE 0.5 MG PO TBDP: 1.0000 mg | ORAL_TABLET | Freq: Every day | ORAL | 0 refills | Status: DC

## 2023-02-16 ENCOUNTER — Encounter: Payer: Self-pay | Admitting: Family Medicine

## 2023-02-17 ENCOUNTER — Other Ambulatory Visit: Payer: Self-pay

## 2023-02-17 MED ORDER — METOPROLOL TARTRATE 25 MG PO TABS: 12.5000 mg | ORAL_TABLET | Freq: Two times a day (BID) | ORAL | 0 refills | Status: DC

## 2023-02-17 MED ORDER — ATORVASTATIN CALCIUM 40 MG PO TABS: 40.0000 mg | ORAL_TABLET | Freq: Every day | ORAL | 0 refills | Status: DC

## 2023-02-18 ENCOUNTER — Telehealth: Payer: Self-pay

## 2023-02-18 DIAGNOSIS — F03B11 Unspecified dementia, moderate, with agitation: Secondary | ICD-10-CM

## 2023-02-18 MED ORDER — RISPERIDONE 0.5 MG PO TBDP
ORAL_TABLET | ORAL | 0 refills | Status: AC
Start: 2023-02-18 — End: ?

## 2023-02-18 NOTE — Telephone Encounter (Signed)
Received call from [facility] pharmacy requesting a quantity change.   Pharmacy is requesting a 90 day supply since they are a mail order pharmacy.   Will forward to McDiarmid.

## 2023-02-20 ENCOUNTER — Encounter: Payer: Self-pay | Admitting: Family Medicine

## 2023-02-20 DIAGNOSIS — F03918 Unspecified dementia, unspecified severity, with other behavioral disturbance: Secondary | ICD-10-CM

## 2023-02-24 ENCOUNTER — Ambulatory Visit: Payer: PPO | Admitting: Family Medicine

## 2023-03-01 ENCOUNTER — Ambulatory Visit: Payer: PPO

## 2023-03-01 ENCOUNTER — Ambulatory Visit (INDEPENDENT_AMBULATORY_CARE_PROVIDER_SITE_OTHER): Payer: PPO | Admitting: Physician Assistant

## 2023-03-01 ENCOUNTER — Encounter: Payer: Self-pay | Admitting: Physician Assistant

## 2023-03-01 VITALS — BP 152/84 | HR 96 | Resp 18 | Ht 68.0 in | Wt 150.0 lb

## 2023-03-01 DIAGNOSIS — R413 Other amnesia: Secondary | ICD-10-CM | POA: Diagnosis not present

## 2023-03-01 DIAGNOSIS — F03B11 Unspecified dementia, moderate, with agitation: Secondary | ICD-10-CM | POA: Diagnosis not present

## 2023-03-01 MED ORDER — DIVALPROEX SODIUM 125 MG PO DR TAB: 125.0000 mg | DELAYED_RELEASE_TABLET | Freq: Every evening | ORAL | 11 refills | Status: DC

## 2023-03-01 NOTE — Patient Instructions (Addendum)
It was a pleasure to see you today at our office.   Recommendations:  Follow up in 2 months Depakote 125 mg at night, may increase to 125 mg twice a day  MRI brain   For guidance regarding [facility] and if placement were needed at the facility, contact Sidney Ace, Social Worker tel: [phone]  For assessment of decision of mental capacity and competency:  Call Dr. Erick Blinks, geriatric psychiatrist at [phone] Counseling regarding caregiver distress, including caregiver depression, anxiety and issues regarding community resources, adult day care programs, adult living facilities, or memory care questions:  please contact your  Primary Doctor's Social Worker  Whom to call: Memory  decline, memory medications: Call our office [phone]    If you have any severe symptoms of a stroke, or other severe issues such as confusion,severe chills or fever, etc call 911 or go to the ER as you may need to be evaluated further      RECOMMENDATIONS FOR ALL PATIENTS WITH MEMORY PROBLEMS: 1. Continue to exercise (Recommend 30 minutes of walking everyday, or 3 hours every week) 2. Increase social interactions - continue going to [place] and enjoy social gatherings with friends and family 3. Eat healthy, avoid fried foods and eat more fruits and vegetables 4. Maintain adequate blood pressure, blood sugar, and blood cholesterol level. Reducing the risk of stroke and cardiovascular disease also helps promoting better memory. 5. Avoid stressful situations. Live a simple life and avoid aggravations. Organize your time and prepare for the next day in anticipation. 6. Sleep well, avoid any interruptions of sleep and avoid any distractions in the bedroom that may interfere with adequate sleep quality 7. Avoid sugar, avoid sweets as there is a strong link between excessive sugar intake, diabetes, and cognitive impairment We discussed the Mediterranean diet, which has been shown  to help patients reduce the risk of progressive memory disorders and reduces cardiovascular risk. This includes eating fish, eat fruits and green leafy vegetables, nuts like almonds and hazelnuts, walnuts, and also use olive oil. Avoid fast foods and fried foods as much as possible. Avoid sweets and sugar as sugar use has been linked to worsening of memory function.  There is always a concern of gradual progression of memory problems. If this is the case, then we may need to adjust level of care according to patient needs. Support, both to the patient and caregiver, should then be put into place.    The Alzheimer's Association is here all day, every day for people facing Alzheimer's disease through our free 24/7 Helpline: 727-643-0912. The Helpline provides reliable information and support to all those who need assistance, such as individuals living with memory loss, Alzheimer's or other dementia, caregivers, health care professionals and the public.  Our highly trained and knowledgeable staff can help you with: Understanding memory loss, dementia and Alzheimer's  Medications and other treatment options  General information about aging and brain health  Skills to provide quality care and to find the best care from professionals  Legal, financial and living-arrangement decisions Our Helpline also features: Confidential care consultation provided by master's level clinicians who can help with decision-making support, crisis assistance and education on issues families face every day  Help in a caller's preferred language using our translation service that features more than 200 languages and dialects  Referrals to local community programs, services and ongoing support     FALL PRECAUTIONS: Be cautious when walking. Scan the area for obstacles that may increase  the risk of trips and falls. When getting up in the mornings, sit up at the edge of the bed for a few minutes before getting out of bed.  Consider elevating the bed at the head end to avoid drop of blood pressure when getting up. Walk always in a well-lit room (use night lights in the walls). Avoid area rugs or power cords from appliances in the middle of the walkways. Use a walker or a cane if necessary and consider physical therapy for balance exercise. Get your eyesight checked regularly.  FINANCIAL OVERSIGHT: Supervision, especially oversight when making financial decisions or transactions is also recommended.  HOME SAFETY: Consider the safety of the kitchen when operating appliances like stoves, microwave oven, and blender. Consider having supervision and share cooking responsibilities until no longer able to participate in those. Accidents with firearms and other hazards in the house should be identified and addressed as well.   ABILITY TO BE LEFT ALONE: If patient is unable to contact 911 operator, consider using LifeLine, or when the need is there, arrange for someone to stay with patients. Smoking is a fire hazard, consider supervision or cessation. Risk of wandering should be assessed by caregiver and if detected at any point, supervision and safe proof recommendations should be instituted.  MEDICATION SUPERVISION: Inability to self-administer medication needs to be constantly addressed. Implement a mechanism to ensure safe administration of the medications.     Mediterranean Diet A Mediterranean diet refers to food and lifestyle choices that are based on the traditions of countries located on the [employer]. This way of eating has been shown to help prevent certain conditions and improve outcomes for people who have chronic diseases, like kidney disease and heart disease. What are tips for following this plan? Lifestyle  Cook and eat meals together with your family, when possible. Drink enough fluid to keep your urine clear or pale yellow. Be physically active every day. This includes: Aerobic exercise like  running or swimming. Leisure activities like gardening, walking, or housework. Get 7-8 hours of sleep each night. If recommended by your health care provider, drink red wine in moderation. This means 1 glass a day for nonpregnant women and 2 glasses a day for men. A glass of wine equals 5 oz (150 mL). Reading food labels  Check the serving size of packaged foods. For foods such as rice and pasta, the serving size refers to the amount of cooked product, not dry. Check the total fat in packaged foods. Avoid foods that have saturated fat or trans fats. Check the ingredients list for added sugars, such as corn syrup. Shopping  At the grocery store, buy most of your food from the areas near the walls of the store. This includes: Fresh fruits and vegetables (produce). Grains, beans, nuts, and seeds. Some of these may be available in unpackaged forms or large amounts (in bulk). Fresh seafood. Poultry and eggs. Low-fat dairy products. Buy whole ingredients instead of prepackaged foods. Buy fresh fruits and vegetables in-season from local farmers markets. Buy frozen fruits and vegetables in resealable bags. If you do not have access to quality fresh seafood, buy precooked frozen shrimp or canned fish, such as tuna, salmon, or sardines. Buy small amounts of raw or cooked vegetables, salads, or olives from the deli or salad bar at your store. Stock your pantry so you always have certain foods on hand, such as olive oil, canned tuna, canned tomatoes, rice, pasta, and beans. Cooking  Cook foods with extra-virgin olive  oil instead of using butter or other vegetable oils. Have meat as a side dish, and have vegetables or grains as your main dish. This means having meat in small portions or adding small amounts of meat to foods like pasta or stew. Use beans or vegetables instead of meat in common dishes like chili or lasagna. Experiment with different cooking methods. Try roasting or broiling vegetables  instead of steaming or sauteing them. Add frozen vegetables to soups, stews, pasta, or rice. Add nuts or seeds for added healthy fat at each meal. You can add these to yogurt, salads, or vegetable dishes. Marinate fish or vegetables using olive oil, lemon juice, garlic, and fresh herbs. Meal planning  Plan to eat 1 vegetarian meal one day each week. Try to work up to 2 vegetarian meals, if possible. Eat seafood 2 or more times a week. Have healthy snacks readily available, such as: Vegetable sticks with hummus. Greek yogurt. Fruit and nut trail mix. Eat balanced meals throughout the week. This includes: Fruit: 2-3 servings a day Vegetables: 4-5 servings a day Low-fat dairy: 2 servings a day Fish, poultry, or lean meat: 1 serving a day Beans and legumes: 2 or more servings a week Nuts and seeds: 1-2 servings a day Whole grains: 6-8 servings a day Extra-virgin olive oil: 3-4 servings a day Limit red meat and sweets to only a few servings a month What are my food choices? Mediterranean diet Recommended Grains: Whole-grain pasta. Brown rice. Bulgar wheat. Polenta. Couscous. Whole-wheat bread. Orpah Cobb. Vegetables: Artichokes. Beets. Broccoli. Cabbage. Carrots. Eggplant. Green beans. Chard. Kale. Spinach. Onions. Leeks. Peas. Squash. Tomatoes. Peppers. Radishes. Fruits: Apples. Apricots. Avocado. Berries. Bananas. Cherries. Dates. Figs. Grapes. Lemons. Melon. Oranges. Peaches. Plums. Pomegranate. Meats and other protein foods: Beans. Almonds. Sunflower seeds. Pine nuts. Peanuts. Cod. Salmon. Scallops. Shrimp. Tuna. Tilapia. Clams. Oysters. Eggs. Dairy: Low-fat milk. Cheese. Greek yogurt. Beverages: Water. Red wine. Herbal tea. Fats and oils: Extra virgin olive oil. Avocado oil. Grape seed oil. Sweets and desserts: [place] yogurt with honey. Baked apples. Poached pears. Trail mix. Seasoning and other foods: Basil. Cilantro. Coriander. Cumin. Mint. Parsley. Sage. Rosemary. Tarragon.  Garlic. Oregano. Thyme. Pepper. Balsalmic vinegar. Tahini. Hummus. Tomato sauce. Olives. Mushrooms. Limit these Grains: Prepackaged pasta or rice dishes. Prepackaged cereal with added sugar. Vegetables: Deep fried potatoes (french fries). Fruits: Fruit canned in syrup. Meats and other protein foods: Beef. Pork. Lamb. Poultry with skin. Hot dogs. Tomasa Blase. Dairy: Ice cream. Sour cream. Whole milk. Beverages: Juice. Sugar-sweetened soft drinks. Beer. Liquor and spirits. Fats and oils: Butter. Canola oil. Vegetable oil. Beef fat (tallow). Lard. Sweets and desserts: Cookies. Cakes. Pies. Candy. Seasoning and other foods: Mayonnaise. Premade sauces and marinades. The items listed may not be a complete list. Talk with your dietitian about what dietary choices are right for you. Summary The Mediterranean diet includes both food and lifestyle choices. Eat a variety of fresh fruits and vegetables, beans, nuts, seeds, and whole grains. Limit the amount of red meat and sweets that you eat. Talk with your health care provider about whether it is safe for you to drink red wine in moderation. This means 1 glass a day for nonpregnant women and 2 glasses a day for men. A glass of wine equals 5 oz (150 mL). This information is not intended to replace advice given to you by your health care provider. Make sure you discuss any questions you have with your health care provider. Document Released: 03/18/2016 Document Revised: 04/20/2016 Document Reviewed: 03/18/2016  Risk analyst Patient Education  [employer].
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
## 2023-03-02 ENCOUNTER — Other Ambulatory Visit: Payer: Self-pay

## 2023-03-02 ENCOUNTER — Encounter: Payer: Self-pay | Admitting: Family Medicine

## 2023-03-02 ENCOUNTER — Ambulatory Visit (INDEPENDENT_AMBULATORY_CARE_PROVIDER_SITE_OTHER): Payer: PPO | Admitting: Family Medicine

## 2023-03-02 VITALS — BP 158/100 | HR 75 | Wt 165.4 lb

## 2023-03-02 DIAGNOSIS — I10 Essential (primary) hypertension: Secondary | ICD-10-CM

## 2023-03-02 DIAGNOSIS — F03B11 Unspecified dementia, moderate, with agitation: Secondary | ICD-10-CM

## 2023-03-02 MED ORDER — ATORVASTATIN CALCIUM 40 MG PO TABS: 40.0000 mg | ORAL_TABLET | Freq: Every day | ORAL | Status: DC

## 2023-03-02 NOTE — Assessment & Plan Note (Signed)
Established problem Uncontrolled.  Patient is not at goal. Will set up appointment with Dr Linwood Dibbles, patient's PCP to f/U.

## 2023-03-02 NOTE — Progress Notes (Signed)
Steve Gordon is accompanied by wife Sources of clinical information for visit is/are spouse/SO and Neuro office visit Gwynneth Munson, NP. Nursing assessment for this office visit was reviewed with the patient for accuracy and revision.     Previous Report(s) Reviewed: Neuro consult     03/02/2023    1:29 PM  Depression screen PHQ 2/9  Decreased Interest 1  Down, Depressed, Hopeless 1  PHQ - 2 Score 2  Altered sleeping 1  Tired, decreased energy 1  Change in appetite 0  Feeling bad or failure about yourself  1  Trouble concentrating 0  Moving slowly or fidgety/restless 0  Suicidal thoughts 3  PHQ-9 Score 8  Difficult doing work/chores Not difficult at all   Flowsheet Row Office Visit from 03/02/2023 in [facility] Office Visit from 01/20/2023 in [facility] Office Visit from 09/01/2022 in [facility]  Thoughts that you would be better off dead, or of hurting yourself in some way Nearly every day More than half the days Not at all  PHQ-9 Total Score 8 11 6           03/02/2023    1:29 PM 03/01/2023    1:08 PM 01/20/2023   10:59 AM 12/13/2022    9:01 AM 09/01/2022    9:51 AM  Fall Risk   Falls in the past year? 0 0 0 0 0  Number falls in past yr: 0 0 0 0 0  Injury with Fall? 0 0 0 0   Risk for fall due to :    No Fall Risks   Follow up  Falls evaluation completed  Falls evaluation completed        03/02/2023    1:29 PM 01/20/2023   10:59 AM 09/01/2022    9:51 AM  PHQ9 SCORE ONLY  PHQ-9 Total Score 8 11 6     There are no preventive care reminders to display for this patient.  Health Maintenance Due  Topic Date Due   Zoster Vaccines- Shingrix (1 of 2) Never done   COVID-19 Vaccine (1 - 2023-24 season) Never done      History/P.E. limitations: dementia  There are no preventive care reminders to display for this patient. There are no preventive care reminders to display for this patient.  Health Maintenance Due   Topic Date Due   Zoster Vaccines- Shingrix (1 of 2) Never done   COVID-19 Vaccine (1 - 2023-24 season) Never done     Chief Complaint  Patient presents with   Follow-up     --------------------------------------------------------------------------------------------------------------------------------------------- Visit Problem List with A/P  Dementia with agitation (HCC) Established problem Uncontrolled.   Patient saying he does not know who his wife is Yelling at her to leave. She is staying at her son's house at night. Patient reports thinking of death a lot, but no intnent or plans to harm self.. No guns in house.  They have been locked up at patient's son's home.    [place]iofe reprots Ms Gwynneth Munson, NP-Neuro, is starting Tegretol and stopping the risperidone.   Will see how patient does with change. Again recommended she consider patient placement in ALF with memory unit.  Will keep in contact with wife about patient's behavior.     HYPERTENSION, BENIGN Established problem Uncontrolled.  Patient is not at goal. Will set up appointment with Dr Linwood Dibbles, patient's PCP to f/U.

## 2023-03-02 NOTE — Patient Instructions (Addendum)
Stopping rivaroxaban and starting Tegretol as instructed by Ms Gwynneth Munson.

## 2023-03-02 NOTE — Assessment & Plan Note (Addendum)
Established problem Uncontrolled.   Patient saying he does not know who his wife is Yelling at her to leave. She is staying at her son's house at night. Patient reports thinking of death a lot, but no intnent or plans to harm self.. No guns in house.  They have been locked up at patient's son's home.    Wiofe reprots Ms Gwynneth Munson, NP-Neuro, is starting Tegretol and stopping the risperidone.   Will see how patient does with change. Again recommended she consider patient placement in ALF with memory unit.  Will keep in contact with wife about patient's behavior.

## 2023-03-03 ENCOUNTER — Other Ambulatory Visit: Payer: Self-pay | Admitting: Physician Assistant

## 2023-03-03 ENCOUNTER — Telehealth: Payer: Self-pay | Admitting: Physician Assistant

## 2023-03-03 MED ORDER — DIVALPROEX SODIUM 125 MG PO DR TAB: 125.0000 mg | DELAYED_RELEASE_TABLET | Freq: Every evening | ORAL | 3 refills | Status: DC

## 2023-03-03 NOTE — Telephone Encounter (Signed)
[facility] called to get clarification on the RX Depakote 125 mg, 60 tabs and take 1 at bed time . Please clarify directions and obtain 90 day supply.  [phone]  REF# [id number]

## 2023-03-08 ENCOUNTER — Encounter: Payer: Self-pay | Admitting: Physician Assistant

## 2023-03-08 NOTE — Telephone Encounter (Signed)
Pts spouse is calling back stating that she forgot to leave the number but she wanted to let us know it was her.

## 2023-03-17 ENCOUNTER — Encounter: Payer: Self-pay | Admitting: Physician Assistant

## 2023-03-21 ENCOUNTER — Other Ambulatory Visit: Payer: PPO

## 2023-03-23 ENCOUNTER — Emergency Department (HOSPITAL_COMMUNITY)
Admission: EM | Admit: 2023-03-23 | Discharge: 2023-03-25 | Disposition: A | Discharge status: Home / Self Care | Payer: PPO | Source: Home / Self Care | Attending: Emergency Medicine | Admitting: Emergency Medicine

## 2023-03-23 ENCOUNTER — Encounter (HOSPITAL_COMMUNITY): Payer: Self-pay

## 2023-03-23 ENCOUNTER — Other Ambulatory Visit: Payer: Self-pay

## 2023-03-23 DIAGNOSIS — R456 Violent behavior: Secondary | ICD-10-CM | POA: Diagnosis not present

## 2023-03-23 DIAGNOSIS — F03B11 Unspecified dementia, moderate, with agitation: Secondary | ICD-10-CM | POA: Diagnosis not present

## 2023-03-23 DIAGNOSIS — R451 Restlessness and agitation: Secondary | ICD-10-CM | POA: Diagnosis not present

## 2023-03-23 DIAGNOSIS — R9431 Abnormal electrocardiogram [ECG] [EKG]: Secondary | ICD-10-CM | POA: Diagnosis not present

## 2023-03-23 DIAGNOSIS — F03911 Unspecified dementia, unspecified severity, with agitation: Secondary | ICD-10-CM

## 2023-03-23 DIAGNOSIS — Z7901 Long term (current) use of anticoagulants: Secondary | ICD-10-CM | POA: Diagnosis not present

## 2023-03-23 DIAGNOSIS — R4689 Other symptoms and signs involving appearance and behavior: Secondary | ICD-10-CM

## 2023-03-23 DIAGNOSIS — F0390 Unspecified dementia without behavioral disturbance: Secondary | ICD-10-CM | POA: Diagnosis not present

## 2023-03-23 HISTORY — DX: Unspecified dementia, moderate, with agitation: F03.B11

## 2023-03-23 LAB — RAPID URINE DRUG SCREEN, HOSP PERFORMED
Amphetamines: NOT DETECTED
Barbiturates: NOT DETECTED
Benzodiazepines: NOT DETECTED
Cocaine: NOT DETECTED
Opiates: NOT DETECTED
Tetrahydrocannabinol: NOT DETECTED

## 2023-03-23 LAB — COMPREHENSIVE METABOLIC PANEL
ALT: 33 U/L (ref 0–44)
AST: 29 U/L (ref 15–41)
Albumin: 3.6 g/dL (ref 3.5–5.0)
Alkaline Phosphatase: 107 U/L (ref 38–126)
Anion gap: 12 (ref 5–15)
BUN: 19 mg/dL (ref 8–23)
CO2: 26 mmol/L (ref 22–32)
Calcium: 9.2 mg/dL (ref 8.9–10.3)
Chloride: 104 mmol/L (ref 98–111)
Creatinine, Ser: 1.77 mg/dL — ABNORMAL HIGH (ref 0.61–1.24)
GFR, Estimated: 37 mL/min — ABNORMAL LOW (ref >60)
Glucose, Bld: 145 mg/dL — ABNORMAL HIGH (ref 70–99)
Potassium: 3.7 mmol/L (ref 3.5–5.1)
Sodium: 142 mmol/L (ref 135–145)
Total Bilirubin: 2.2 mg/dL — ABNORMAL HIGH (ref 0.3–1.2)
Total Protein: 6.7 g/dL (ref 6.5–8.1)

## 2023-03-23 LAB — URINALYSIS, ROUTINE W REFLEX MICROSCOPIC
Bacteria, UA: NONE SEEN
Bilirubin Urine: NEGATIVE
Glucose, UA: NEGATIVE mg/dL
Ketones, ur: NEGATIVE mg/dL
Leukocytes,Ua: NEGATIVE
Nitrite: NEGATIVE
Protein, ur: NEGATIVE mg/dL
Specific Gravity, Urine: 1.014 (ref 1.005–1.030)
pH: 5 (ref 5.0–8.0)

## 2023-03-23 LAB — CBC
HCT: 43.4 % (ref 39.0–52.0)
Hemoglobin: 14.3 g/dL (ref 13.0–17.0)
MCH: 32.9 pg (ref 26.0–34.0)
MCHC: 32.9 g/dL (ref 30.0–36.0)
MCV: 100 fL (ref 80.0–100.0)
Platelets: 170 10*3/uL (ref 150–400)
RBC: 4.34 MIL/uL (ref 4.22–5.81)
RDW: 13.5 % (ref 11.5–15.5)
WBC: 7.3 10*3/uL (ref 4.0–10.5)
nRBC: 0 % (ref 0.0–0.2)

## 2023-03-23 LAB — ETHANOL: Alcohol, Ethyl (B): <10 mg/dL (ref <10)

## 2023-03-23 LAB — ACETAMINOPHEN LEVEL: Acetaminophen (Tylenol), Serum: <10 ug/mL — ABNORMAL LOW (ref 10–30)

## 2023-03-23 LAB — SALICYLATE LEVEL: Salicylate Lvl: <7 mg/dL — ABNORMAL LOW (ref 7.0–30.0)

## 2023-03-23 MED ORDER — DIVALPROEX SODIUM 125 MG PO DR TAB
125.0000 mg | DELAYED_RELEASE_TABLET | Freq: Every day | ORAL | Status: DC
  Administered 2023-03-23: 125 mg via ORAL
  Filled 2023-03-23 (×2): qty 1

## 2023-03-23 MED ORDER — RISPERIDONE 1 MG PO TABS
1.0000 mg | ORAL_TABLET | Freq: Every day | ORAL | Status: DC
Start: 2023-03-23 — End: 2023-03-23

## 2023-03-23 MED ORDER — DONEPEZIL HCL 10 MG PO TABS: 10.0000 mg | ORAL_TABLET | Freq: Every day | ORAL | Status: DC

## 2023-03-23 MED ORDER — FUROSEMIDE 20 MG PO TABS
20.0000 mg | ORAL_TABLET | ORAL | Status: DC
  Administered 2023-03-23: 20 mg via ORAL
  Filled 2023-03-23: qty 1

## 2023-03-23 MED ORDER — RISPERIDONE 1 MG PO TBDP
1.0000 mg | ORAL_TABLET | Freq: Every evening | ORAL | Status: DC
  Administered 2023-03-23: 1 mg via ORAL
  Filled 2023-03-23: qty 1

## 2023-03-23 MED ORDER — ATORVASTATIN CALCIUM 40 MG PO TABS
40.0000 mg | ORAL_TABLET | Freq: Every day | ORAL | Status: DC
  Administered 2023-03-24 – 2023-03-25 (×2): 40 mg via ORAL
  Filled 2023-03-23 (×2): qty 1

## 2023-03-23 MED ORDER — METOPROLOL TARTRATE 25 MG PO TABS
12.5000 mg | ORAL_TABLET | Freq: Two times a day (BID) | ORAL | Status: DC
  Administered 2023-03-23 – 2023-03-25 (×4): 12.5 mg via ORAL
  Filled 2023-03-23 (×4): qty 1

## 2023-03-23 MED ORDER — STERILE WATER FOR INJECTION IJ SOLN
INTRAMUSCULAR | Status: AC
  Administered 2023-03-23: 1.2 mL
  Filled 2023-03-23: qty 10

## 2023-03-23 MED ORDER — ZIPRASIDONE MESYLATE 20 MG IM SOLR
10.0000 mg | Freq: Once | INTRAMUSCULAR | Status: AC
  Administered 2023-03-23: 10 mg via INTRAMUSCULAR
  Filled 2023-03-23: qty 20

## 2023-03-23 MED ORDER — TAMSULOSIN HCL 0.4 MG PO CAPS
0.4000 mg | ORAL_CAPSULE | Freq: Every day | ORAL | Status: DC
  Administered 2023-03-23: 0.4 mg via ORAL
  Filled 2023-03-23: qty 1

## 2023-03-23 MED ORDER — ATORVASTATIN CALCIUM 40 MG PO TABS
40.0000 mg | ORAL_TABLET | Freq: Every day | ORAL | Status: DC
  Administered 2023-03-23: 40 mg via ORAL
  Filled 2023-03-23: qty 1

## 2023-03-23 MED ORDER — APIXABAN 2.5 MG PO TABS
2.5000 mg | ORAL_TABLET | Freq: Two times a day (BID) | ORAL | Status: DC
  Administered 2023-03-23 – 2023-03-25 (×4): 2.5 mg via ORAL
  Filled 2023-03-23 (×6): qty 1

## 2023-03-23 MED ORDER — LORAZEPAM 1 MG PO TABS
1.0000 mg | ORAL_TABLET | Freq: Four times a day (QID) | ORAL | Status: DC | PRN
  Administered 2023-03-23: 1 mg via ORAL
  Filled 2023-03-23: qty 1

## 2023-03-23 NOTE — ED Notes (Signed)
IVC docs e-filed, pending case number from [place]. Envelope # [id number] IVC docs taken to [facility], copy filed in medical records, original in [employer], entry made in notebook and on chart.

## 2023-03-23 NOTE — ED Notes (Signed)
Belongings placed in locker #3.  

## 2023-03-23 NOTE — ED Notes (Signed)
Fully IVC'd Case # [id number] Exp 03/30/23

## 2023-03-23 NOTE — ED Notes (Signed)
Patient becoming agitated and trying to get through the locked triage door; EDP notified for need of PRN agitation meds-Monique,RN

## 2023-03-23 NOTE — ED Notes (Signed)
Patient arrived IVC'd; First Exam performed by Dr. Charm Barges; all IVC docs e-filed and receipt from clerk regarding same received in this writer's email.  Envelope # [phone]
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
## 2023-03-23 NOTE — ED Notes (Signed)
TTS in process 
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
## 2023-03-23 NOTE — ED Notes (Signed)
Patient wife called to check on patient; Pt currently resting at this time-Monique,RN
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
## 2023-03-23 NOTE — ED Notes (Signed)
Pt has been changed out into paper scrubs and has been wanded by security.

## 2023-03-23 NOTE — ED Notes (Signed)
Patient is agitated at the moment and is unwilling to take medications

## 2023-03-24 ENCOUNTER — Encounter (HOSPITAL_COMMUNITY): Payer: Self-pay | Admitting: Psychiatry

## 2023-03-24 DIAGNOSIS — F03B11 Unspecified dementia, moderate, with agitation: Secondary | ICD-10-CM

## 2023-03-24 MED ORDER — QUETIAPINE FUMARATE 25 MG PO TABS
25.0000 mg | ORAL_TABLET | Freq: Every day | ORAL | Status: DC
  Administered 2023-03-24: 25 mg via ORAL
  Filled 2023-03-24: qty 1

## 2023-03-24 MED ORDER — DIVALPROEX SODIUM 125 MG PO CSDR
125.0000 mg | DELAYED_RELEASE_CAPSULE | Freq: Three times a day (TID) | ORAL | Status: DC
  Administered 2023-03-24 – 2023-03-25 (×3): 125 mg via ORAL
  Filled 2023-03-24 (×3): qty 1

## 2023-03-24 MED ORDER — TRAZODONE HCL 50 MG PO TABS: 25.0000 mg | ORAL_TABLET | Freq: Three times a day (TID) | ORAL | Status: DC | PRN

## 2023-03-24 NOTE — ED Provider Notes (Signed)
Emergency Medicine Observation Re-evaluation Note  Steve Gordon is a 87 y.o. male, seen on rounds today.  Pt initially presented to the ED for complaints of IVC Currently, the patient is not having any new complaints.  Physical Exam  BP (!) 164/91 (BP Location: Left Arm)   Pulse 76   Temp (!) 97.5 F (36.4 C) (Oral)   Resp 20   SpO2 99%  Physical Exam General: Resting comfortably in stretcher Lungs: Normal work of breathing Psych: Calm  ED Course / MDM  EKG:EKG Interpretation Date/Time:  Wednesday March 23 2023 15:08:03 EDT Ventricular Rate:  92 PR Interval:    QRS Duration:  94 QT Interval:  378 QTC Calculation: 467 R Axis:   59  Text Interpretation: Atrial fibrillation Nonspecific T wave abnormality Prolonged QT Abnormal ECG When compared with ECG of 31-Jan-2021 05:21, No significant change since last tracing Confirmed by Linwood Dibbles [phone]) on 03/23/2023 4:07:40 PM  I have reviewed the labs performed to date as well as medications administered while in observation.  Recent changes in the last 24 hours include admitted and IVC paperwork filled out.  Patient is still agitated requiring frequent redirection.  Psychiatry has seen and recommends [facility] psych placement.  Plan  Current plan is for placement.   Rondel Baton, MD 03/24/23 2005

## 2023-03-24 NOTE — ED Notes (Signed)
Pt has put Lt hearing aid back in.
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
## 2023-03-24 NOTE — ED Notes (Signed)
Pts Lt hearing aid was found.

## 2023-03-24 NOTE — ED Notes (Signed)
Patient's spouse is at bedside, changing batteries to patient's hearing aids

## 2023-03-24 NOTE — Progress Notes (Signed)
LCSW Progress Note  [id number]   Steve Gordon  03/24/2023  1:26 AM    Inpatient Behavioral Health Placement  Pt meets inpatient criteria per [name], NP.  here are no available beds within [facility] per [facility] Kim Brooks,RN. Referral was sent to the following facilities;   Destination  Service Provider Address Phone Fax  [facility] Patient Placement  [facility] [place], [place] [phone]  [facility]  [address] [facility] Regino Bellow [place] [address] [phone]  [facility]  [address] [place] [address] [phone]  [facility] [address]  [address] [place] [address] [phone]  [facility]  [address] [place] [address] [phone]  [facility]  [address] [place] [address] [phone]  [facility]  [address] [place] [address] [phone]  [facility]  [address] [place] [address] [phone]  [facility]  [place] [phone]  [facility]  [address] [place] [address] [phone]  Renella Cunas  [place] -- [phone]  [facility]  [address] [place] [address] [phone]  [facility]  [address], [place] [address] [phone]  [facility]  [address], [place] [address] [phone]  [facility]  [address], [place] [address] [phone]  [facility]  [place] [phone]   [facility]  [address] [place] [address] [phone]  [facility]  [address] [place] [address] [phone]  [facility]  [address] [place] [address] [phone]  [facility]  [address] [place] [address] [phone]  [facility]  [address] [place] [address] [phone]  [facility]  [address] [place] [address] [phone]    Situation ongoing,  CSW will follow up.    Maryjean Ka, MSW, LCSWA 03/24/2023 1:26 AM

## 2023-03-24 NOTE — ED Notes (Signed)
Per wife, patient's left hearing aid has been lost. Consulting civil engineer informed.

## 2023-03-24 NOTE — ED Notes (Signed)
Patient is asleep. Did not get vitals per Trish Mage

## 2023-03-24 NOTE — ED Notes (Signed)
Pt starting to get confused & agitated, requiring more redirection than before.

## 2023-03-25 MED ORDER — QUETIAPINE FUMARATE 25 MG PO TABS: 25.0000 mg | ORAL_TABLET | Freq: Every day | ORAL | 0 refills | Status: DC

## 2023-03-25 MED ORDER — QUETIAPINE FUMARATE 25 MG PO TABS: 25.0000 mg | ORAL_TABLET | Freq: Two times a day (BID) | ORAL | Status: DC

## 2023-03-25 MED ORDER — QUETIAPINE FUMARATE 50 MG PO TABS: 50.0000 mg | ORAL_TABLET | Freq: Every day | ORAL | Status: DC

## 2023-03-25 NOTE — Progress Notes (Signed)
CSW contacted patients wife and offered memory care resources. CSW asked patients wife if her husband had any other insurance besides healthteam advantage. Patients wife stated no and he also doesn't have a long term care policy. CSW told patients wife that she will need to apply for medicaid (Special assistance) if she wants him placed into a memory care. CSW told patients wife that it can take social services 30-45 days to process the application. CSW told patients wife she has the option of paying privately in the meantime. CSW told patients wife that memory care could range form 6,000-12,000 monthly. Patients wife stated she cannot afford that and she guesses she will have to take her husband home. Patients wife stated patient does have a daughter Steve Gordon that's been helping her. Patients wife stated her husband is only aggressive towards her. CSW told patient wife that she could hire an aide to assist her in the home while the medicaid application is pending. Patients wife stated she doesn't like people coming into her home because people steal. Patients wife told CSW that patients daughter Steve Gordon works for [employer] is a [employer]. CSW told patients wife to check with Steve Gordon to see if she can offer assistance with her company. Patients wife stated Steve Gordon will be coming to pick patient up. Patients wife refused to give CSW [name]'s number to confirm. CSW will have patients RN contact the wife when her husband is ready for discharge. All resources  have been attached to patients AVS.

## 2023-03-25 NOTE — ED Notes (Signed)
Pt out of bed confused. Attempted to barricade the door with chair, but following commands and redirectable at this time. RN returned pt back to bed

## 2023-03-25 NOTE — ED Notes (Signed)
Spoke w/ pt's wife and updated her on what this RN was told in report as to how pt did overnight. Told pt's wife that I would notify the [facility] NP that she would like to talk to them to get a more comprehensive update.

## 2023-03-25 NOTE — ED Notes (Signed)
Pt's wife called back and will come get pt in about 35 to 45 minutes

## 2023-03-25 NOTE — Discharge Instructions (Addendum)
APPLY FOR MEDICAID AT THE LOCATION BELOW  [facility]  [address]  [place], [place] [address]  [facility] consulted regarding Memory Care Facilities and Agencies.  RNCM provided the following list for family.  Memory Care Facilities in [place], [place]  Also serving communities of [place].  [place] [facility] [place] [facility] [address], [place], [place] [address] [phone]   [facility] [address], [place], [place] [address] [phone]  [facility] [address] [place], [place] [address] [phone]  [facility] [address] [place], [place] [address] [phone]  [facility] [address] [place], [place] [address] [phone]  [facility] [address] [place], [place] [address] [phone]  [facility] [address], [place], [place] [address] [phone]  [facility] [address], [place], [place] [address] [phone]  [facility] [address], [place], [place] [address] [phone]  [facility] [place], [place] Memory Care [address], [place], [place] [address] [phone] of [place] [address] [place], [place] [address] [phone]  [facility] [address], [place], [place] [address] [phone]  The average cost of memory care in [place] is [phone] per month. This is higher than the national median of $5,395. Cheaper nearby regions include [place] with an average starting cost of $5,067.  [employer] has helped many in [place] find high-quality senior care. To speak with one of our Family Advisors about memory care options and costs in [place], call [phone].   Private Pay Resources  [facility] Address: [address]  [place], [place] [address] Phone: [phone]  [facility] Address: [address], [place], [place] [address] Phone: [phone]  [facility] Address: [address] [place], [place] [address] Phone: [phone]  [facility] Address: [address] [place], [place] [address] Phone: [phone]  [facility] Address: [address] [place], [place], [place] [address] Phone: [phone]  [facility] Phone: [phone]  [facility] Address:  [address], [place], [place] [address] Phone:  [phone]  [facility] Address:  [address] Phone:  [phone]  [email or web address]  [employer] Phone: [phone]

## 2023-03-25 NOTE — ED Provider Notes (Addendum)
Emergency Medicine Observation Re-evaluation Note  Steve Gordon is a 87 y.o. male, seen on rounds today.  Pt initially presented to the ED for complaints of IVC Currently, the patient is sitting outside of his room and doing a word search.  Physical Exam  BP (!) 160/106 (BP Location: Right Arm)   Pulse 79   Temp 97.8 F (36.6 C) (Oral)   Resp 17   SpO2 94%  Physical Exam Constitutional:      Appearance: Normal appearance.  Cardiovascular:     Rate and Rhythm: Normal rate.  Pulmonary:     Effort: Pulmonary effort is normal.  Neurological:     Mental Status: He is alert. Mental status is at baseline.  Psychiatric:        Mood and Affect: Mood normal.        Behavior: Behavior normal.    General:  Cardiac:  Lungs:  Psych:   ED Course / MDM  EKG:EKG Interpretation Date/Time:  Wednesday March 23 2023 15:08:03 EDT Ventricular Rate:  92 PR Interval:    QRS Duration:  94 QT Interval:  378 QTC Calculation: 467 R Axis:   59  Text Interpretation: Atrial fibrillation Nonspecific T wave abnormality Prolonged QT Abnormal ECG When compared with ECG of 31-Jan-2021 05:21, No significant change since last tracing Confirmed by Linwood Dibbles [phone]) on 03/23/2023 4:07:40 PM  I have reviewed the labs performed to date as well as medications administered while in observation.  Recent changes in the last 24 hours include--no changes.    Plan  Patient was evaluated by TTS, unfortunately they do not have any recommendations as the current believes that this is likely dementia and not an acute psychiatric condition.  Patient was seen by [facility], who also spoke with the patient's wife.  They have provided additional resources for home care, and the patient will be discharged home. IVC rescinded.    Anders Simmonds T, DO 03/25/23 1116    Anders Simmonds T, DO 03/25/23 1258

## 2023-03-25 NOTE — ED Notes (Signed)
Pt using the telephone at this time. Pt calm and cooperative

## 2023-03-25 NOTE — ED Notes (Signed)
Patients IVC Paperwork has been rescinded

## 2023-03-25 NOTE — ED Notes (Signed)
Called pt's wife x 1 and left voicemail to notify pt's wife that pt is ready for pick up and to call this RN back.

## 2023-03-25 NOTE — Progress Notes (Signed)
[facility] Psych ED Progress Note  03/25/2023 10:53 AM Steve Gordon  MRN:  [id number]   Subjective:  Patient seen face to face this morning while eating breakfast.  He is pleasant and cooperative. He is hard of hearing. He is oriented to self and knows he is in [place].  Patient said he is feeling "good" with a euthymic affect.  Patient endorsed no auditory and/or visual hallucinations, and did not appear to be responding to internal stimuli and/or presenting with psychotic features.  Patient denied paranoia.  Patient was able to tell me his wife's first and last name and that they live together, but outside of this, was unable to articulate much else. Patient endorses no suicidal or homicidal ideations, past or present.  Patient has no memory of events that happened with his spouse.    Patient monitored overnight; he was not violent.  When he woke, he was redirected and slept the remained of the night. Patient does not meet criteria for inpatient psychiatric hospitalization. Patient is psych cleared.   Principal Problem: Unspecified dementia, moderate, with agitation (HCC) Diagnosis:  Principal Problem:   Unspecified dementia, moderate, with agitation [facility])   ED Assessment Time Calculation: Start Time: 0830 Stop Time: 0915 Total Time in Minutes (Assessment Completion): 45   Past Psychiatric History: Dementia  [place] Scale:  Flowsheet Row ED from 03/23/2023 in [facility] Emergency Department at [facility] Most recent reading at 03/24/2023  7:00 PM ED to Hosp-Admission (Discharged) from 01/30/2021 in [facility] Most recent reading at 01/30/2021 10:00 PM ED from 01/30/2021 in [facility] Ur[facility] Care at [facility] Most recent reading at 01/30/2021 10:58 AM  C-SSRS RISK CATEGORY No Risk No Risk Error: Question 6 not populated       Past Medical History:  Past Medical History:  Diagnosis Date   Atrial fibrillation (HCC)    BPPV (benign paroxysmal positional  vertigo), unspecified laterality 09/26/2019   CAD (coronary artery disease)    a. Ant MI 1998;  b. 2003: s/p CABG x 4 (VG->Diag, VG->OM, VG->PDA, LIMA->LAD);  c. PCI 2008: RCA - 4.0x16 Liberte BMS;  d. 07/2010 PCI: OM1- 3.5x20 Promus DES;  e. 03/2013 Cath/PCI: LM nl, LAD 60-70p/m (FFR 0.9->Med Rx), Diag 100, LCX patent prox stent, OM1 95-90(3.0x32 Promus Premier DES), RCA dom, 63m, 30 ISR, VG->Diag ok, VG->OM KTBO, VG->PDA KTBO, LIMA->LAD known atresia, EF 55%   Coronary artery disease involving native coronary artery of native heart without angina pectoris    Dyslipidemia    Hypertension    Incidental lung nodule, > 3mm and < 8mm 11/25/2010   NOted at admission.     December 01, 2009 CT scan      CT CHEST WITHOUT CONTRAST       Technique:  Multidetector CT imaging of the chest was performed    following the standard protocol without IV contrast.       Comparison: Chest radiograph 08/06/2010       Findings: No pathologically enlarged mediastinal or axillary lymph    nodes.  Hilar regions are difficult to definitively evaluate    without IV   Moderate dementia with agitation, unspecified dementia type (HCC)    Statin intolerance     Past Surgical History:  Procedure Laterality Date   ANKLE FRACTURE SURGERY Left ~ 1999   "broke in 1993; casted; PCP said not broken; had MI; walked for rehab; wore ankle out; put in plates and screws" (03/26/2013)   bronchogenic cyst resection  08/06/1997   CHOLECYSTECTOMY     CORONARY ANGIOPLASTY WITH STENT PLACEMENT  1998-03/26/2013   "2 at one time in 1998; this one  today is the 3rd one I've had since [facility] in 2003" (03/26/2013)   CORONARY ANGIOPLASTY WITH STENT PLACEMENT  10/05/2006   4.38mmx 16mm Liberte non DES   CORONARY ARTERY BYPASS GRAFT  2003   x4 LIMA to LAD SVG digonal, OM, PDA   INGUINAL HERNIA REPAIR Right    "3 times" (03/26/2013)   LAPAROSCOPIC INCISIONAL / UMBILICAL / VENTRAL HERNIA REPAIR  2004   LEFT HEART CATHETERIZATION WITH CORONARY/GRAFT ANGIOGRAM  N/A 03/26/2013   Procedure: LEFT HEART CATHETERIZATION WITH Isabel Caprice;  Surgeon: Kathleene Hazel, MD;  Location: [facility] CATH LAB;  Service: Cardiovascular;  Laterality: N/A;   Family History:  Family History  Problem Relation Age of Onset   Hypertension Other    Heart disease Other    Kidney cancer Other    Family Psychiatric  History: None noted Social History:  Social History   Substance and Sexual Activity  Alcohol Use Not Currently   Comment: 03/26/2013 "couple shots some weeks; none for 2 months; just take a drink q now and then"     Social History   Substance and Sexual Activity  Drug Use No    Social History   Socioeconomic History   Marital status: Married    Spouse name: Not on file   Number of children: 2   Years of education: Not on file   Highest education level: Not on file  Occupational History   Occupation: Retired, but works on cars part-time  Tobacco Use   Smoking status: Never   Smokeless tobacco: Never  Vaping Use   Vaping status: Never Used  Substance and Sexual Activity   Alcohol use: Not Currently    Comment: 03/26/2013 "couple shots some weeks; none for 2 months; just take a drink q now and then"   Drug use: No   Sexual activity: Yes  Other Topics Concern   Not on file  Social History Narrative   Level of education: 10th grade   Employment: retired, 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 [phone]   Lives in [place] with wife. 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   Social Determinants of Health   Financial Resource Strain: Low Risk  (12/13/2022)   Overall Financial Resource Strain (CARDIA)    Difficulty of Paying Living Expenses: Not hard at all  Food Insecurity: No Food Insecurity (12/13/2022)   Hunger Vital Sign    Worried  About Running Out of Food in the Last Year: Never true    Ran Out of Food in the Last Year: Never true  Transportation Needs: No Transportation Needs (12/13/2022)   PRAPARE - Administrator, Civil Service (Medical): No    Lack of Transportation (Non-Medical): No  Physical Activity: Insufficiently Active (12/13/2022)   Exercise Vital Sign    Days of Exercise per Week: 1 day    Minutes of Exercise per Session: 20 min  Stress: No Stress Concern Present (12/13/2022)   [employer] of Occupational Health - Occupational Stress Questionnaire    Feeling of Stress : Not at all  Social Connections: Moderately Isolated (12/13/2022)   Social Connection  and Isolation Panel [NHANES]    Frequency of Communication with Friends and Family: More than three times a week    Frequency of Social Gatherings with Friends and Family: More than three times a week    Attends Religious Services: Never    Database administrator or Organizations: Patient unable to answer    Attends Banker Meetings: Never    Marital Status: Married    Sleep: Fair  Appetite:  Good  Current Medications: Current Facility-Administered Medications  Medication Dose Route Frequency Provider Last Rate Last Admin   apixaban (ELIQUIS) tablet 2.5 mg  2.5 mg Oral BID Linwood Dibbles, MD   2.5 mg at 03/25/23 1005   atorvastatin (LIPITOR) tablet 40 mg  40 mg Oral Daily Linwood Dibbles, MD   40 mg at 03/25/23 1005   divalproex (DEPAKOTE SPRINKLE) capsule 125 mg  125 mg Oral TID Lenox Ponds, NP   125 mg at 03/25/23 1005   metoprolol tartrate (LOPRESSOR) tablet 12.5 mg  12.5 mg Oral BID Linwood Dibbles, MD   12.5 mg at 03/25/23 1006   QUEtiapine (SEROQUEL) tablet 25 mg  25 mg Oral QHS Lenox Ponds, NP   25 mg at 03/24/23 2122   traZODone (DESYREL) tablet 25 mg  25 mg Oral TID PRN Lenox Ponds, NP       Current Outpatient Medications  Medication Sig Dispense Refill   apixaban (ELIQUIS) 2.5 MG TABS tablet Take 1 tablet (2.5  mg total) by mouth 2 (two) times daily. 180 tablet 3   atorvastatin (LIPITOR) 40 MG tablet Take 1 tablet (40 mg total) by mouth daily.     divalproex (DEPAKOTE) 125 MG DR tablet Take 1 tablet (125 mg total) by mouth at bedtime. 90 tablet 3   metoprolol tartrate (LOPRESSOR) 25 MG tablet Take 0.5 tablets (12.5 mg total) by mouth 2 (two) times daily. 30 tablet 0   nitroGLYCERIN (NITROSTAT) 0.4 MG SL tablet Place 1 tablet (0.4 mg total) under the tongue every 5 (five) minutes as needed for chest pain. 25 tablet 7   risperiDONE (RISPERDAL M-TABS) 0.5 MG disintegrating tablet One tablet by mouth in morning and two tablet at bedtime 270 tablet 0   clobetasol ointment (TEMOVATE) 0.05 % Apply 1 application. topically 2 (two) times daily. (Patient not taking: Reported on 03/23/2023) 30 g 0   donepezil (ARICEPT) 10 MG tablet Take 1 tablet (10 mg total) by mouth at bedtime. (Patient not taking: Reported on 03/23/2023) 90 tablet 3   furosemide (LASIX) 40 MG tablet Take 0.5 tablets (20 mg total) by mouth 3 (three) times a week. (Patient not taking: Reported on 03/23/2023) 90 tablet 3   tamsulosin (FLOMAX) 0.4 MG CAPS capsule Take 1 capsule (0.4 mg total) by mouth daily. (Patient not taking: Reported on 03/23/2023) 30 capsule 3    Lab Results:  Results for orders placed or performed during the hospital encounter of 03/23/23 (from the past 48 hour(s))  Urinalysis, Routine w reflex microscopic -Urine, Clean Catch     Status: Abnormal   Collection Time: 03/23/23  3:45 PM  Result Value Ref Range   Color, Urine YELLOW YELLOW   APPearance CLEAR CLEAR   Specific Gravity, Urine 1.014 1.005 - 1.030   pH 5.0 5.0 - 8.0   Glucose, UA NEGATIVE NEGATIVE mg/dL   Hgb urine dipstick SMALL (A) NEGATIVE   Bilirubin Urine NEGATIVE NEGATIVE   Ketones, ur NEGATIVE NEGATIVE mg/dL   Protein, ur NEGATIVE NEGATIVE mg/dL  Nitrite NEGATIVE NEGATIVE   Leukocytes,Ua NEGATIVE NEGATIVE   RBC / HPF 0-5 0 - 5 RBC/hpf   WBC, UA 0-5 0 - 5  WBC/hpf   Bacteria, UA NONE SEEN NONE SEEN   Squamous Epithelial / HPF 0-5 0 - 5 /HPF   Mucus PRESENT     Comment: Performed at [facility], [address], [place], [place] [address]  Comprehensive metabolic panel     Status: Abnormal   Collection Time: 03/23/23  3:47 PM  Result Value Ref Range   Sodium 142 135 - 145 mmol/L   Potassium 3.7 3.5 - 5.1 mmol/L   Chloride 104 98 - 111 mmol/L   CO2 26 22 - 32 mmol/L   Glucose, Bld 145 (H) 70 - 99 mg/dL    Comment: Glucose reference range applies only to samples taken after fasting for at least 8 hours.   BUN 19 8 - 23 mg/dL   Creatinine, Ser 4.40 (H) 0.61 - 1.24 mg/dL   Calcium 9.2 8.9 - 10.2 mg/dL   Total Protein 6.7 6.5 - 8.1 g/dL   Albumin 3.6 3.5 - 5.0 g/dL   AST 29 15 - 41 U/L   ALT 33 0 - 44 U/L   Alkaline Phosphatase 107 38 - 126 U/L   Total Bilirubin 2.2 (H) 0.3 - 1.2 mg/dL   GFR, Estimated 37 (L) >60 mL/min    Comment: (NOTE) Calculated using the CKD-EPI Creatinine Equation (2021)    Anion gap 12 5 - 15    Comment: Performed at [facility], [address], [place], [place] [address]  Ethanol     Status: None   Collection Time: 03/23/23  3:47 PM  Result Value Ref Range   Alcohol, Ethyl (B) <10 <10 mg/dL    Comment: (NOTE) Lowest detectable limit for serum alcohol is 10 mg/dL.  For medical purposes only. Performed at [facility], [address], [place], [place] [address]   Salicylate level     Status: Abnormal   Collection Time: 03/23/23  3:47 PM  Result Value Ref Range   Salicylate Lvl <7.0 (L) 7.0 - 30.0 mg/dL    Comment: Performed at [facility], [address] [place], [place] [address]  Acetaminophen level     Status: Abnormal   Collection Time: 03/23/23  3:47 PM  Result Value Ref Range   Acetaminophen (Tylenol), Serum <10 (L) 10 - 30 ug/mL    Comment: (NOTE) Therapeutic concentrations vary significantly. A range of 10-30 ug/mL  may be an effective concentration for many  patients. However, some  are best treated at concentrations outside of this range. Acetaminophen concentrations >150 ug/mL at 4 hours after ingestion  and >50 ug/mL at 12 hours after ingestion are often associated with  toxic reactions.  Performed at [facility], [address] [place], [place] [address]   cbc     Status: None   Collection Time: 03/23/23  3:47 PM  Result Value Ref Range   WBC 7.3 4.0 - 10.5 K/uL   RBC 4.34 4.22 - 5.81 MIL/uL   Hemoglobin 14.3 13.0 - 17.0 g/dL   HCT 75.6 43.3 - 29.5 %   MCV 100.0 80.0 - 100.0 fL   MCH 32.9 26.0 - 34.0 pg   MCHC 32.9 30.0 - 36.0 g/dL   RDW 18.8 41.6 - 60.6 %   Platelets 170 150 - 400 K/uL   nRBC 0.0 0.0 - 0.2 %  Comment: Performed at [facility], [address] [place], [place] [address]  Rapid urine drug screen (hospital performed)     Status: None   Collection Time: 03/23/23  3:47 PM  Result Value Ref Range   Opiates NONE DETECTED NONE DETECTED   Cocaine NONE DETECTED NONE DETECTED   Benzodiazepines NONE DETECTED NONE DETECTED   Amphetamines NONE DETECTED NONE DETECTED   Tetrahydrocannabinol NONE DETECTED NONE DETECTED   Barbiturates NONE DETECTED NONE DETECTED    Comment: (NOTE) DRUG SCREEN FOR MEDICAL PURPOSES ONLY.  IF CONFIRMATION IS NEEDED FOR ANY PURPOSE, NOTIFY LAB WITHIN 5 DAYS.  LOWEST DETECTABLE LIMITS FOR URINE DRUG SCREEN Drug Class                     Cutoff (ng/mL) Amphetamine and metabolites    1000 Barbiturate and metabolites    200 Benzodiazepine                 200 Opiates and metabolites        300 Cocaine and metabolites        300 THC                            50 Performed at [facility], [address] [place], [place] [address]     Blood Alcohol level:  Lab Results  Component Value Date   ETH <10 03/23/2023    Physical Findings:  CIWA:    COWS:     Musculoskeletal: Strength & Muscle Tone: within normal limits Gait & Station: normal Patient leans:  N/A  Psychiatric Specialty Exam:  Presentation  General Appearance:  Appropriate for Environment  Eye Contact: Good  Speech: Clear and Coherent; Normal Rate  Speech Volume: Decreased  Handedness: Right   Mood and Affect  Mood: Euthymic  Affect: Congruent   Thought Process  Thought Processes: Linear  Descriptions of Associations:Loose  Orientation:Partial (Oriented to person)  Thought Content:Scattered  History of Schizophrenia/Schizoaffective disorder:No  Duration of Psychotic Symptoms:No data recorded Hallucinations:Hallucinations: None  Ideas of Reference:None  Suicidal Thoughts:Suicidal Thoughts: No  Homicidal Thoughts:Homicidal Thoughts: No   Sensorium  Memory: Immediate Poor; Recent Poor; Remote Poor  Judgment: Impaired  Insight: Lacking   Executive Functions  Concentration: Poor  Attention Span: Fair  Recall: Poor  Fund of Knowledge: Fair  Language: Fair   Psychomotor Activity  Psychomotor Activity: Psychomotor Activity: Normal   Assets  Assets: Social Support; Leisure Time   Sleep  Sleep: Sleep: Good Number of Hours of Sleep: 5    Physical Exam: Physical Exam Vitals and nursing note reviewed.  Eyes:     Pupils: Pupils are equal, round, and reactive to light.  Pulmonary:     Effort: Pulmonary effort is normal.  Skin:    General: Skin is dry.  Neurological:     Mental Status: He is alert and oriented to person, place, and time.    Review of Systems  Psychiatric/Behavioral:  Positive for memory loss.   All other systems reviewed and are negative.  Blood pressure (!) 160/106, pulse 79, temperature 97.8 F (36.6 C), temperature source Oral, resp. rate 17, SpO2 94%. There is no height or weight on file to calculate BMI.   Medical Decision Making: Patient case reviewed and discussed with Dr Clovis Riley. Patient does not meet criteria for inpatient psychiatric hospitalization.  He is psychiatrically  cleared.  TOC consult to assist family with memory care  placement.  Problem 1: Dementia  - TOC consult to assist wife with memory care placement for patient   Thomes Lolling, NP 03/25/2023, 10:53 AM

## 2023-03-25 NOTE — ED Notes (Signed)
Pt's discharge papers were discussed w/ pt's wife over the phone per Andria Meuse DO. All belongings returned to pt.

## 2023-03-25 NOTE — ED Notes (Signed)
Pt talking on the phone. 

## 2023-03-25 NOTE — ED Notes (Signed)
Rescind done

## 2023-04-04 ENCOUNTER — Telehealth: Payer: Self-pay | Admitting: *Deleted

## 2023-04-04 NOTE — Telephone Encounter (Signed)
Transition Care Management Unsuccessful Follow-up Telephone Call  Date of discharge and from where:  [facility] 03/23/2023  Attempts:  1st Attempt  Reason for unsuccessful TCM follow-up call:  No answer/busy

## 2023-04-05 ENCOUNTER — Telehealth: Payer: Self-pay | Admitting: *Deleted

## 2023-04-05 NOTE — Telephone Encounter (Signed)
Transition Care Management Unsuccessful Follow-up Telephone Call  Date of discharge and from where:  [facility]  03/25/2023  Attempts:  2nd Attempt  Reason for unsuccessful TCM follow-up call:  Left voice message

## 2023-04-06 ENCOUNTER — Telehealth: Payer: Self-pay

## 2023-04-06 ENCOUNTER — Ambulatory Visit (INDEPENDENT_AMBULATORY_CARE_PROVIDER_SITE_OTHER): Payer: PPO

## 2023-04-06 DIAGNOSIS — Z111 Encounter for screening for respiratory tuberculosis: Secondary | ICD-10-CM | POA: Diagnosis not present

## 2023-04-06 NOTE — Progress Notes (Signed)
Patient presents to nurse clinic for TB screening. Per forms, patient would either need a two step Mantoux skin test or a TB blood test.   Spoke with Dr. Miquel Dunn regarding patient. She placed orders for Quantiferon Gold.   Patient's wife provided FL2 forms for completion. Immunization record attached and placed in provider's box.   Veronda Prude, RN

## 2023-04-06 NOTE — Telephone Encounter (Signed)
Patient and wife present to nurse clinic today with [facility] paperwork.   See nurse visit documentation.   Wife is requesting that paperwork be completed by Friday, 04/08/23. Advised wife of form policy. She states that they are trying to get patient placed in facility as soon as possible.   Attached immunization record and placed form in PCP box for completion.   Veronda Prude, RN

## 2023-04-07 ENCOUNTER — Encounter: Payer: Self-pay | Admitting: Family Medicine

## 2023-04-08 ENCOUNTER — Encounter: Payer: Self-pay | Admitting: Family Medicine

## 2023-04-08 NOTE — Telephone Encounter (Signed)
Patient calls nurse line checking the status of lab work and FL2 completion.   She was very tearful on the phone reporting FL2 needs to be completed by today. She reports he has dementia and has been wandering and "hateful" to her.   Advised the Qaunt Gold may not be back by today. Unfortunately, this test can take up to 5 business days.   She is requesting FL2 be completed by today.   Will forward to PCP.

## 2023-04-08 NOTE — Telephone Encounter (Signed)
Patient returns call to nurse line and has left multiple mychart messages regarding the urgency of forms.   Dr. Jennette Kettle was able to assist in completing this required paperwork.   Quant Gold still pending. Attempted to call [facility] and speak with Denita regarding pending labs.   Denita returned call to nurse line. She states that once we have the labs results, we can fax to [phone].   Called wife and provided with update. Wife is going to go ahead and pick up the completed FL2. I have asked the front office to have wife sign ROI when she picks up FL2 form.   Veronda Prude, RN

## 2023-04-11 LAB — QUANTIFERON-TB GOLD PLUS
QuantiFERON Mitogen Value: >10 [IU]/mL
QuantiFERON Nil Value: 0.03 [IU]/mL
QuantiFERON TB1 Ag Value: 0.02 [IU]/mL
QuantiFERON TB2 Ag Value: 0.03 [IU]/mL
QuantiFERON-TB Gold Plus: NEGATIVE

## 2023-04-12 ENCOUNTER — Telehealth: Payer: Self-pay

## 2023-04-12 NOTE — Telephone Encounter (Signed)
Faxed to Denita at [phone] per Manson Passey.  Glennie Hawk, CMA

## 2023-04-12 NOTE — Telephone Encounter (Signed)
-----   Message from Westley Chandler sent at 04/12/2023  9:30 AM EDT ----- [place] [facility] Team,  Please fax below results which are negative  Denita returned call to nurse line. She states that once we have the labs results, we can fax to [phone]. ----- Message ----- From: Nell Range Lab Results In Sent: 04/08/2023   8:15 AM EDT To: Billey Co, MD

## 2023-04-20 DIAGNOSIS — R2681 Unsteadiness on feet: Secondary | ICD-10-CM | POA: Diagnosis not present

## 2023-04-20 DIAGNOSIS — N400 Enlarged prostate without lower urinary tract symptoms: Secondary | ICD-10-CM | POA: Diagnosis not present

## 2023-04-20 DIAGNOSIS — F5105 Insomnia due to other mental disorder: Secondary | ICD-10-CM | POA: Diagnosis not present

## 2023-04-20 DIAGNOSIS — L299 Pruritus, unspecified: Secondary | ICD-10-CM | POA: Diagnosis not present

## 2023-04-20 DIAGNOSIS — I2510 Atherosclerotic heart disease of native coronary artery without angina pectoris: Secondary | ICD-10-CM | POA: Diagnosis not present

## 2023-04-20 DIAGNOSIS — I4891 Unspecified atrial fibrillation: Secondary | ICD-10-CM | POA: Diagnosis not present

## 2023-04-20 DIAGNOSIS — F0283 Dementia in other diseases classified elsewhere, unspecified severity, with mood disturbance: Secondary | ICD-10-CM | POA: Diagnosis not present

## 2023-04-20 DIAGNOSIS — F411 Generalized anxiety disorder: Secondary | ICD-10-CM | POA: Diagnosis not present

## 2023-04-20 DIAGNOSIS — I129 Hypertensive chronic kidney disease with stage 1 through stage 4 chronic kidney disease, or unspecified chronic kidney disease: Secondary | ICD-10-CM | POA: Diagnosis not present

## 2023-04-20 DIAGNOSIS — F03918 Unspecified dementia, unspecified severity, with other behavioral disturbance: Secondary | ICD-10-CM | POA: Diagnosis not present

## 2023-04-22 ENCOUNTER — Ambulatory Visit: Payer: PPO | Admitting: Family Medicine

## 2023-04-22 DIAGNOSIS — I10 Essential (primary) hypertension: Secondary | ICD-10-CM | POA: Diagnosis not present

## 2023-04-24 DIAGNOSIS — Z9181 History of falling: Secondary | ICD-10-CM | POA: Diagnosis not present

## 2023-04-24 DIAGNOSIS — Z556 Problems related to health literacy: Secondary | ICD-10-CM | POA: Diagnosis not present

## 2023-04-24 DIAGNOSIS — G309 Alzheimer's disease, unspecified: Secondary | ICD-10-CM | POA: Diagnosis not present

## 2023-04-24 DIAGNOSIS — F02811 Dementia in other diseases classified elsewhere, unspecified severity, with agitation: Secondary | ICD-10-CM | POA: Diagnosis not present

## 2023-04-24 DIAGNOSIS — L299 Pruritus, unspecified: Secondary | ICD-10-CM | POA: Diagnosis not present

## 2023-04-24 DIAGNOSIS — I509 Heart failure, unspecified: Secondary | ICD-10-CM | POA: Diagnosis not present

## 2023-04-24 DIAGNOSIS — I130 Hypertensive heart and chronic kidney disease with heart failure and stage 1 through stage 4 chronic kidney disease, or unspecified chronic kidney disease: Secondary | ICD-10-CM | POA: Diagnosis not present

## 2023-04-24 DIAGNOSIS — Z7901 Long term (current) use of anticoagulants: Secondary | ICD-10-CM | POA: Diagnosis not present

## 2023-04-24 DIAGNOSIS — Z87891 Personal history of nicotine dependence: Secondary | ICD-10-CM | POA: Diagnosis not present

## 2023-04-24 DIAGNOSIS — M1990 Unspecified osteoarthritis, unspecified site: Secondary | ICD-10-CM | POA: Diagnosis not present

## 2023-04-24 DIAGNOSIS — N400 Enlarged prostate without lower urinary tract symptoms: Secondary | ICD-10-CM | POA: Diagnosis not present

## 2023-04-24 DIAGNOSIS — Z604 Social exclusion and rejection: Secondary | ICD-10-CM | POA: Diagnosis not present

## 2023-04-24 DIAGNOSIS — N183 Chronic kidney disease, stage 3 unspecified: Secondary | ICD-10-CM | POA: Diagnosis not present

## 2023-04-24 DIAGNOSIS — I2510 Atherosclerotic heart disease of native coronary artery without angina pectoris: Secondary | ICD-10-CM | POA: Diagnosis not present

## 2023-04-24 DIAGNOSIS — I482 Chronic atrial fibrillation, unspecified: Secondary | ICD-10-CM | POA: Diagnosis not present

## 2023-04-26 DIAGNOSIS — E559 Vitamin D deficiency, unspecified: Secondary | ICD-10-CM | POA: Diagnosis not present

## 2023-04-26 DIAGNOSIS — Z79899 Other long term (current) drug therapy: Secondary | ICD-10-CM | POA: Diagnosis not present

## 2023-04-26 DIAGNOSIS — E785 Hyperlipidemia, unspecified: Secondary | ICD-10-CM | POA: Diagnosis not present

## 2023-04-27 DIAGNOSIS — N1832 Chronic kidney disease, stage 3b: Secondary | ICD-10-CM | POA: Diagnosis not present

## 2023-04-27 DIAGNOSIS — R2681 Unsteadiness on feet: Secondary | ICD-10-CM | POA: Diagnosis not present

## 2023-04-27 DIAGNOSIS — I129 Hypertensive chronic kidney disease with stage 1 through stage 4 chronic kidney disease, or unspecified chronic kidney disease: Secondary | ICD-10-CM | POA: Diagnosis not present

## 2023-04-27 DIAGNOSIS — L299 Pruritus, unspecified: Secondary | ICD-10-CM | POA: Diagnosis not present

## 2023-05-04 DIAGNOSIS — F03918 Unspecified dementia, unspecified severity, with other behavioral disturbance: Secondary | ICD-10-CM | POA: Diagnosis not present

## 2023-05-04 DIAGNOSIS — R451 Restlessness and agitation: Secondary | ICD-10-CM | POA: Diagnosis not present

## 2023-05-04 DIAGNOSIS — F064 Anxiety disorder due to known physiological condition: Secondary | ICD-10-CM | POA: Diagnosis not present

## 2023-05-05 ENCOUNTER — Ambulatory Visit: Payer: PPO | Admitting: Physician Assistant

## 2023-05-06 ENCOUNTER — Encounter (HOSPITAL_COMMUNITY): Payer: Self-pay

## 2023-05-06 ENCOUNTER — Other Ambulatory Visit: Payer: Self-pay

## 2023-05-06 ENCOUNTER — Emergency Department (HOSPITAL_COMMUNITY)
Admission: EM | Admit: 2023-05-06 | Discharge: 2023-05-06 | Disposition: A | Discharge status: Home / Self Care | Payer: PPO | Attending: Emergency Medicine | Admitting: Emergency Medicine

## 2023-05-06 ENCOUNTER — Emergency Department (HOSPITAL_COMMUNITY): Payer: PPO

## 2023-05-06 DIAGNOSIS — S0003XA Contusion of scalp, initial encounter: Secondary | ICD-10-CM | POA: Diagnosis not present

## 2023-05-06 DIAGNOSIS — Z951 Presence of aortocoronary bypass graft: Secondary | ICD-10-CM | POA: Diagnosis not present

## 2023-05-06 DIAGNOSIS — F0390 Unspecified dementia without behavioral disturbance: Secondary | ICD-10-CM | POA: Insufficient documentation

## 2023-05-06 DIAGNOSIS — S098XXA Other specified injuries of head, initial encounter: Secondary | ICD-10-CM | POA: Insufficient documentation

## 2023-05-06 DIAGNOSIS — W19XXXA Unspecified fall, initial encounter: Secondary | ICD-10-CM | POA: Insufficient documentation

## 2023-05-06 DIAGNOSIS — M160 Bilateral primary osteoarthritis of hip: Secondary | ICD-10-CM | POA: Diagnosis not present

## 2023-05-06 DIAGNOSIS — R58 Hemorrhage, not elsewhere classified: Secondary | ICD-10-CM | POA: Diagnosis not present

## 2023-05-06 DIAGNOSIS — Z043 Encounter for examination and observation following other accident: Secondary | ICD-10-CM | POA: Diagnosis not present

## 2023-05-06 DIAGNOSIS — S0990XA Unspecified injury of head, initial encounter: Secondary | ICD-10-CM | POA: Diagnosis not present

## 2023-05-06 DIAGNOSIS — I10 Essential (primary) hypertension: Secondary | ICD-10-CM | POA: Diagnosis not present

## 2023-05-06 DIAGNOSIS — I700 Atherosclerosis of aorta: Secondary | ICD-10-CM | POA: Diagnosis not present

## 2023-05-06 DIAGNOSIS — R0902 Hypoxemia: Secondary | ICD-10-CM | POA: Diagnosis not present

## 2023-05-06 DIAGNOSIS — S199XXA Unspecified injury of neck, initial encounter: Secondary | ICD-10-CM | POA: Diagnosis not present

## 2023-05-06 DIAGNOSIS — R220 Localized swelling, mass and lump, head: Secondary | ICD-10-CM | POA: Diagnosis not present

## 2023-05-06 LAB — BASIC METABOLIC PANEL
Anion gap: 12 (ref 5–15)
BUN: 19 mg/dL (ref 8–23)
CO2: 20 mmol/L — ABNORMAL LOW (ref 22–32)
Calcium: 8.8 mg/dL — ABNORMAL LOW (ref 8.9–10.3)
Chloride: 107 mmol/L (ref 98–111)
Creatinine, Ser: 1.22 mg/dL (ref 0.61–1.24)
GFR, Estimated: 58 mL/min — ABNORMAL LOW (ref >60)
Glucose, Bld: 90 mg/dL (ref 70–99)
Potassium: 4 mmol/L (ref 3.5–5.1)
Sodium: 139 mmol/L (ref 135–145)

## 2023-05-06 LAB — CBC WITH DIFFERENTIAL/PLATELET
Abs Immature Granulocytes: 0.03 10*3/uL (ref 0.00–0.07)
Basophils Absolute: 0 10*3/uL (ref 0.0–0.1)
Basophils Relative: 1 %
Eosinophils Absolute: 0.1 10*3/uL (ref 0.0–0.5)
Eosinophils Relative: 2 %
HCT: 40.2 % (ref 39.0–52.0)
Hemoglobin: 13.5 g/dL (ref 13.0–17.0)
Immature Granulocytes: 1 %
Lymphocytes Relative: 12 %
Lymphs Abs: 0.8 10*3/uL (ref 0.7–4.0)
MCH: 33.7 pg (ref 26.0–34.0)
MCHC: 33.6 g/dL (ref 30.0–36.0)
MCV: 100.2 fL — ABNORMAL HIGH (ref 80.0–100.0)
Monocytes Absolute: 0.7 10*3/uL (ref 0.1–1.0)
Monocytes Relative: 12 %
Neutro Abs: 4.7 10*3/uL (ref 1.7–7.7)
Neutrophils Relative %: 72 %
Platelets: 148 10*3/uL — ABNORMAL LOW (ref 150–400)
RBC: 4.01 MIL/uL — ABNORMAL LOW (ref 4.22–5.81)
RDW: 14 % (ref 11.5–15.5)
WBC: 6.4 10*3/uL (ref 4.0–10.5)
nRBC: 0 % (ref 0.0–0.2)

## 2023-05-06 NOTE — ED Provider Notes (Signed)
[place] EMERGENCY DEPARTMENT AT [facility] Provider Note   CSN: [id number] Arrival date & time: 05/06/23  1034     History Chief Complaint  Patient presents with   Fall    HPI Steve Gordon is a 87 y.o. male presenting for GLF on thinners. He was found down at his nursing facility. He has no complaints but has underlying dementia. Does not have much recollection of the day.  Per facility via EMS he is at his mental status baseline.  Patient's recorded medical, surgical, social, medication list and allergies were reviewed in the Snapshot window as part of the initial history.   Review of Systems   Review of Systems  Unable to perform ROS: Dementia    Physical Exam Updated Vital Signs BP (!) 173/89   Pulse 87   Temp 97.9 F (36.6 C) (Oral)   Resp 15   Ht 5\' 8"  (1.727 m)   Wt 75 kg   SpO2 98%   BMI 25.14 kg/m  Physical Exam Vitals and nursing note reviewed.  Constitutional:      General: He is not in acute distress.    Appearance: He is well-developed.  HENT:     Head: Normocephalic and atraumatic.  Eyes:     Conjunctiva/sclera: Conjunctivae normal.  Cardiovascular:     Rate and Rhythm: Normal rate and regular rhythm.     Heart sounds: No murmur heard. Pulmonary:     Effort: Pulmonary effort is normal. No respiratory distress.     Breath sounds: Normal breath sounds.  Abdominal:     Palpations: Abdomen is soft.     Tenderness: There is no abdominal tenderness.  Musculoskeletal:        General: Signs of injury (see pics) present. No swelling.     Cervical back: Neck supple.  Skin:    General: Skin is warm and dry.     Capillary Refill: Capillary refill takes less than 2 seconds.  Neurological:     Mental Status: He is alert.  Psychiatric:        Mood and Affect: Mood normal.         ED Course/ Medical Decision Making/ A&P    Procedures Procedures   Medications Ordered in ED Medications - No data to display  Medical  Decision Making:    Steve Gordon is a 87 y.o. male who presented to the ED today with a high mechanisma trauma, detailed above.    By institutional and departmental policy this was activated as a level 2 trauma. Handoff received from EMS.  Patient placed on continuous vitals and telemetry monitoring while in ED which was reviewed periodically.   Given this mechanism of trauma, a full physical exam was performed.  Reviewed and confirmed nursing documentation for past medical history, family history, social history.    Initial Assessment/Plan:   I was called emergently to patient's bedside for a primary survey.  Primary survey: Airway intact.  BL breath sounds present.   Circulation established with WNL BP, 2 large bore IVs, and radial/femoral pulses.   Disability evaluation negative. No obvious disability requiring intervention.   Patient fully exposed and all injuries were noted, any penetrating injuries were labeled with radiopaque markers.  No emergent interventions took place in the primary survey.    Patient stable for CXR that demonstrated no traumatic hemopneumothorax and PXR that demonstrated no unstable pelvic fractures.  EFAST deferred.   Secondary survey: Once patient was stabilized, I personally performed a  secondary survey to evaluate for any other injuries.  Results of this evaluation documented in the physical exam section. This is a patient presenting with a high mechanism trauma.  As such, I have considered intracranial injuries including intracranial hemorrhage, intrathoracic injuries including blunt myocardial or blunt lung injury, blunt abdominal injuries including aortic dissection, bladder injury, spleen injury, liver injury and I have considered orthopedic injuries including extremity or spinal injury.   This was all evaluated by the below imaging as well as concurrently ordered laboratory evaluation which was reviewed.  Radiology: All radiology results were  reviewed independently and agree with reads per radiology provider. CT HEAD WO CONTRAST ( )  Result Date: 05/06/2023 CLINICAL DATA:  Head trauma, minor (Age >= 65y); Polytrauma, blunt EXAM: CT HEAD WITHOUT CONTRAST CT CERVICAL SPINE WITHOUT CONTRAST TECHNIQUE: Multidetector CT imaging of the head and cervical spine was performed following the standard protocol without intravenous contrast. Multiplanar CT image reconstructions of the cervical spine were also generated. RADIATION DOSE REDUCTION: This exam was performed according to the departmental dose-optimization program which includes automated exposure control, adjustment of the mA and/or kV according to patient size and/or use of iterative reconstruction technique. COMPARISON:  MRI head from 03/01/2023, CT scan head from 01/30/2021 and CT scan cervical spine report from 01/19/2011 FINDINGS: CT HEAD FINDINGS Brain: No evidence of acute infarction, hemorrhage, hydrocephalus, extra-axial collection or mass lesion/mass effect. There is bilateral periventricular hypodensity, which is non-specific but most likely seen in the settings of microvascular ischemic changes. Mild in extent. Vascular: No hyperdense vessel or unexpected calcification. Skull: Normal. Negative for fracture or focal lesion. Sinuses/Orbits: No acute finding. Redemonstration of chronic left maxillary sinus opacification with destruction of the medial wall and asymmetric thickening of the remaining walls, suggesting chronic phenomena. Mucosal thickening also extends into the left-sided mastoid air cells. There is near complete opacification of left frontal sinus as well. Other: There is moderate sized right periorbital/forehead extracranial scalp hematoma/swelling without underlying fracture. Small amount of fluid again noted in the left inferior mastoid air cells, favoring mastoid effusion. CT CERVICAL SPINE FINDINGS Alignment: Normal. This examination does not assess for ligamentous injury or  stability. Skull base and vertebrae: No acute fracture. No primary bone lesion or focal pathologic process. Note is made of unfused C1 posterior arch. Soft tissues and spinal canal: No prevertebral fluid or swelling. No visible canal hematoma. Disc levels: There are moderate multilevel degenerative changes characterized by reduced intervertebral disc height, endplate sclerosis/irregularity, facet arthropathy and marginal osteophyte formation. Upper chest: Negative. Other: None. IMPRESSION: 1. No acute intracranial abnormality. No calvarial fracture. 2. No acute fracture or listhesis of the cervical spine. 3. Multiple other nonacute observations, as described above. Electronically Signed   By: Jules Schick M.D.   On: 05/06/2023 12:13   CT CERVICAL SPINE WO CONTRAST  Result Date: 05/06/2023 CLINICAL DATA:  Head trauma, minor (Age >= 65y); Polytrauma, blunt EXAM: CT HEAD WITHOUT CONTRAST CT CERVICAL SPINE WITHOUT CONTRAST TECHNIQUE: Multidetector CT imaging of the head and cervical spine was performed following the standard protocol without intravenous contrast. Multiplanar CT image reconstructions of the cervical spine were also generated. RADIATION DOSE REDUCTION: This exam was performed according to the departmental dose-optimization program which includes automated exposure control, adjustment of the mA and/or kV according to patient size and/or use of iterative reconstruction technique. COMPARISON:  MRI head from 03/01/2023, CT scan head from 01/30/2021 and CT scan cervical spine report from 01/19/2011 FINDINGS: CT HEAD FINDINGS Brain: No evidence of acute infarction,  hemorrhage, hydrocephalus, extra-axial collection or mass lesion/mass effect. There is bilateral periventricular hypodensity, which is non-specific but most likely seen in the settings of microvascular ischemic changes. Mild in extent. Vascular: No hyperdense vessel or unexpected calcification. Skull: Normal. Negative for fracture or focal  lesion. Sinuses/Orbits: No acute finding. Redemonstration of chronic left maxillary sinus opacification with destruction of the medial wall and asymmetric thickening of the remaining walls, suggesting chronic phenomena. Mucosal thickening also extends into the left-sided mastoid air cells. There is near complete opacification of left frontal sinus as well. Other: There is moderate sized right periorbital/forehead extracranial scalp hematoma/swelling without underlying fracture. Small amount of fluid again noted in the left inferior mastoid air cells, favoring mastoid effusion. CT CERVICAL SPINE FINDINGS Alignment: Normal. This examination does not assess for ligamentous injury or stability. Skull base and vertebrae: No acute fracture. No primary bone lesion or focal pathologic process. Note is made of unfused C1 posterior arch. Soft tissues and spinal canal: No prevertebral fluid or swelling. No visible canal hematoma. Disc levels: There are moderate multilevel degenerative changes characterized by reduced intervertebral disc height, endplate sclerosis/irregularity, facet arthropathy and marginal osteophyte formation. Upper chest: Negative. Other: None. IMPRESSION: 1. No acute intracranial abnormality. No calvarial fracture. 2. No acute fracture or listhesis of the cervical spine. 3. Multiple other nonacute observations, as described above. Electronically Signed   By: Jules Schick M.D.   On: 05/06/2023 12:13   DG Pelvis Portable  Result Date: 05/06/2023 CLINICAL DATA:  fall. EXAM: PORTABLE PELVIS 1-2 VIEWS COMPARISON:  11/25/2009. FINDINGS: Pelvis is intact with normal and symmetric sacroiliac joints. No acute fracture or dislocation. No aggressive osseous lesion. Visualized sacral arcuate lines are unremarkable. Unremarkable symphysis pubis. There are mild degenerative changes of bilateral hip joints without significant joint space narrowing. Osteophytosis of the superior acetabulum. No radiopaque foreign  bodies. IMPRESSION: 1. Mild bilateral hip osteoarthritis. Electronically Signed   By: Jules Schick M.D.   On: 05/06/2023 12:01   DG Chest Portable 1 View  Result Date: 05/06/2023 CLINICAL DATA:  fall EXAM: PORTABLE CHEST 1 VIEW COMPARISON:  01/30/2021 FINDINGS: Bilateral lung fields are clear. Bilateral lateral costophrenic angles are clear. Normal cardio-mediastinal silhouette. There are surgical staples along the heart border and sternotomy wires, status post CABG (coronary artery bypass graft). Aortic arch calcifications noted. No acute osseous abnormalities. The soft tissues are within normal limits. IMPRESSION: 1. No active disease. Status post CABG. Electronically Signed   By: Jules Schick M.D.   On: 05/06/2023 12:00   DG Humerus Right  Result Date: 05/06/2023 CLINICAL DATA:  Fall onto right arm. EXAM: RIGHT HUMERUS - 2 VIEW COMPARISON:  None Available. FINDINGS: There is no evidence of fracture or other focal bone lesions. Soft tissues are unremarkable. IMPRESSION: No acute fracture or dislocation. Electronically Signed   By: Agustin Cree M.D.   On: 05/06/2023 11:48    Final Reassessment and Plan:   Patient's history present on his feet exam findings reveal no focal pathology. Reassessed at bedside.  Patient has no complaints again on serial exam.  Given well appearance patient stable for outpatient care management with strict return precautions. Updated family.  Hematoma is subacute per wife.   Disposition:  I have considered need for hospitalization, however, considering all of the above, I believe this patient is stable for discharge at this time.  Patient/family educated about specific return precautions for given chief complaint and symptoms.  Patient/family educated about follow-up with PCP.     Patient/family expressed  understanding of return precautions and need for follow-up. Patient spoken to regarding all imaging and laboratory results and appropriate follow up for these  results. All education provided in verbal form with additional information in written form. Time was allowed for answering of patient questions. Patient discharged.    Emergency Department Medication Summary:   Medications - No data to display   Clinical Impression:  1. Fall, initial encounter      Discharge   Final Clinical Impression(s) / ED Diagnoses Final diagnoses:  Fall, initial encounter    Rx / DC Orders ED Discharge Orders     None         Glyn Ade, MD 05/06/23 1620

## 2023-05-06 NOTE — ED Notes (Signed)
[facility] here to transport pt back to facility. Pt refused temp for DC VS.

## 2023-05-06 NOTE — ED Triage Notes (Addendum)
Pt present to ED from [facility] facility with witnessed fall. Pt noted via EMS with right side of body noted on floor. Pt positive for blood thinner. Pt alert, oriented to name $ place at this time. Skin tear noted to right hand, hematoma noted to right brow, bleeding controlled.  EMS VS: 184/94 100%RA 118 CBG

## 2023-05-06 NOTE — ED Notes (Signed)
Please call and update wife

## 2023-05-06 NOTE — Progress Notes (Signed)
Responded to page to support patient - Fall on Thinners. Provided  Spiritual and emotional support.  Chaplain available as needed.  Venida Jarvis, [place], [facility], Pager [phone] -[phone]

## 2023-05-06 NOTE — ED Notes (Signed)
Called  [facility] ETA 1 Hour

## 2023-05-06 NOTE — Progress Notes (Signed)
Orthopedic Tech Progress Note Patient Details:  Steve Gordon 1935-12-30 [id number]  Patient ID: [name], male   DOB: Jul 30, 1936, 87 y.o.   MRN: [id number] Level 2 trauma Tonye Pearson 05/06/2023, 10:47 AM

## 2023-05-09 DIAGNOSIS — I130 Hypertensive heart and chronic kidney disease with heart failure and stage 1 through stage 4 chronic kidney disease, or unspecified chronic kidney disease: Secondary | ICD-10-CM | POA: Diagnosis not present

## 2023-05-09 DIAGNOSIS — I10 Essential (primary) hypertension: Secondary | ICD-10-CM | POA: Diagnosis not present

## 2023-05-10 ENCOUNTER — Ambulatory Visit: Payer: PPO | Admitting: Physician Assistant

## 2023-05-10 DIAGNOSIS — I130 Hypertensive heart and chronic kidney disease with heart failure and stage 1 through stage 4 chronic kidney disease, or unspecified chronic kidney disease: Secondary | ICD-10-CM | POA: Diagnosis not present

## 2023-05-10 DIAGNOSIS — N183 Chronic kidney disease, stage 3 unspecified: Secondary | ICD-10-CM | POA: Diagnosis not present

## 2023-05-10 DIAGNOSIS — I509 Heart failure, unspecified: Secondary | ICD-10-CM | POA: Diagnosis not present

## 2023-05-11 DIAGNOSIS — F03918 Unspecified dementia, unspecified severity, with other behavioral disturbance: Secondary | ICD-10-CM | POA: Diagnosis not present

## 2023-05-11 DIAGNOSIS — R2681 Unsteadiness on feet: Secondary | ICD-10-CM | POA: Diagnosis not present

## 2023-05-11 DIAGNOSIS — W010XXA Fall on same level from slipping, tripping and stumbling without subsequent striking against object, initial encounter: Secondary | ICD-10-CM | POA: Diagnosis not present

## 2023-05-11 DIAGNOSIS — N183 Chronic kidney disease, stage 3 unspecified: Secondary | ICD-10-CM | POA: Diagnosis not present

## 2023-05-11 DIAGNOSIS — I1310 Hypertensive heart and chronic kidney disease without heart failure, with stage 1 through stage 4 chronic kidney disease, or unspecified chronic kidney disease: Secondary | ICD-10-CM | POA: Diagnosis not present

## 2023-05-13 ENCOUNTER — Other Ambulatory Visit: Payer: Self-pay

## 2023-05-13 ENCOUNTER — Emergency Department (HOSPITAL_COMMUNITY): Payer: PPO

## 2023-05-13 ENCOUNTER — Emergency Department (HOSPITAL_COMMUNITY)
Admission: EM | Admit: 2023-05-13 | Discharge: 2023-05-14 | Disposition: A | Discharge status: Other Acute Inpatient Hospital | Payer: PPO | Attending: Emergency Medicine | Admitting: Emergency Medicine

## 2023-05-13 DIAGNOSIS — R410 Disorientation, unspecified: Secondary | ICD-10-CM | POA: Diagnosis not present

## 2023-05-13 DIAGNOSIS — Z951 Presence of aortocoronary bypass graft: Secondary | ICD-10-CM | POA: Insufficient documentation

## 2023-05-13 DIAGNOSIS — N390 Urinary tract infection, site not specified: Secondary | ICD-10-CM | POA: Diagnosis present

## 2023-05-13 DIAGNOSIS — I2510 Atherosclerotic heart disease of native coronary artery without angina pectoris: Secondary | ICD-10-CM | POA: Insufficient documentation

## 2023-05-13 DIAGNOSIS — J328 Other chronic sinusitis: Secondary | ICD-10-CM | POA: Insufficient documentation

## 2023-05-13 DIAGNOSIS — F0390 Unspecified dementia without behavioral disturbance: Secondary | ICD-10-CM | POA: Diagnosis not present

## 2023-05-13 DIAGNOSIS — R451 Restlessness and agitation: Secondary | ICD-10-CM | POA: Diagnosis not present

## 2023-05-13 DIAGNOSIS — R456 Violent behavior: Secondary | ICD-10-CM | POA: Diagnosis not present

## 2023-05-13 DIAGNOSIS — I10 Essential (primary) hypertension: Secondary | ICD-10-CM | POA: Diagnosis not present

## 2023-05-13 DIAGNOSIS — Z9889 Other specified postprocedural states: Secondary | ICD-10-CM | POA: Diagnosis not present

## 2023-05-13 DIAGNOSIS — F29 Unspecified psychosis not due to a substance or known physiological condition: Secondary | ICD-10-CM | POA: Diagnosis not present

## 2023-05-13 DIAGNOSIS — A419 Sepsis, unspecified organism: Secondary | ICD-10-CM | POA: Diagnosis not present

## 2023-05-13 DIAGNOSIS — I4891 Unspecified atrial fibrillation: Secondary | ICD-10-CM | POA: Insufficient documentation

## 2023-05-13 DIAGNOSIS — S0003XA Contusion of scalp, initial encounter: Secondary | ICD-10-CM | POA: Diagnosis not present

## 2023-05-13 DIAGNOSIS — I493 Ventricular premature depolarization: Secondary | ICD-10-CM | POA: Diagnosis not present

## 2023-05-13 MED ORDER — SODIUM CHLORIDE 0.9 % IV BOLUS
1000.0000 mL | Freq: Once | INTRAVENOUS | Status: AC
  Administered 2023-05-14: 1000 mL via INTRAVENOUS
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
## 2023-05-14 ENCOUNTER — Emergency Department (HOSPITAL_COMMUNITY): Payer: PPO

## 2023-05-14 ENCOUNTER — Other Ambulatory Visit (HOSPITAL_COMMUNITY): Payer: PPO

## 2023-05-14 DIAGNOSIS — Z7401 Bed confinement status: Secondary | ICD-10-CM | POA: Diagnosis not present

## 2023-05-14 DIAGNOSIS — F0390 Unspecified dementia without behavioral disturbance: Secondary | ICD-10-CM | POA: Diagnosis not present

## 2023-05-14 DIAGNOSIS — A419 Sepsis, unspecified organism: Secondary | ICD-10-CM | POA: Diagnosis not present

## 2023-05-14 DIAGNOSIS — S0003XA Contusion of scalp, initial encounter: Secondary | ICD-10-CM | POA: Diagnosis not present

## 2023-05-14 DIAGNOSIS — R410 Disorientation, unspecified: Secondary | ICD-10-CM | POA: Diagnosis not present

## 2023-05-14 LAB — URINALYSIS, W/ REFLEX TO CULTURE (INFECTION SUSPECTED)
Bilirubin Urine: NEGATIVE
Glucose, UA: NEGATIVE mg/dL
Hgb urine dipstick: NEGATIVE
Ketones, ur: 5 mg/dL — AB
Leukocytes,Ua: NEGATIVE
Nitrite: NEGATIVE
Protein, ur: NEGATIVE mg/dL
Specific Gravity, Urine: 1.011 (ref 1.005–1.030)
pH: 5 (ref 5.0–8.0)

## 2023-05-14 LAB — CBC WITH DIFFERENTIAL/PLATELET
Abs Immature Granulocytes: 0.04 10*3/uL (ref 0.00–0.07)
Basophils Absolute: 0 10*3/uL (ref 0.0–0.1)
Basophils Relative: 0 %
Eosinophils Absolute: 0.3 10*3/uL (ref 0.0–0.5)
Eosinophils Relative: 4 %
HCT: 39.6 % (ref 39.0–52.0)
Hemoglobin: 13 g/dL (ref 13.0–17.0)
Immature Granulocytes: 1 %
Lymphocytes Relative: 15 %
Lymphs Abs: 1 10*3/uL (ref 0.7–4.0)
MCH: 33.1 pg (ref 26.0–34.0)
MCHC: 32.8 g/dL (ref 30.0–36.0)
MCV: 100.8 fL — ABNORMAL HIGH (ref 80.0–100.0)
Monocytes Absolute: 0.9 10*3/uL (ref 0.1–1.0)
Monocytes Relative: 14 %
Neutro Abs: 4.4 10*3/uL (ref 1.7–7.7)
Neutrophils Relative %: 66 %
Platelets: 164 10*3/uL (ref 150–400)
RBC: 3.93 MIL/uL — ABNORMAL LOW (ref 4.22–5.81)
RDW: 14.7 % (ref 11.5–15.5)
WBC: 6.7 10*3/uL (ref 4.0–10.5)
nRBC: 0 % (ref 0.0–0.2)

## 2023-05-14 LAB — COMPREHENSIVE METABOLIC PANEL
ALT: 38 U/L (ref 0–44)
AST: 38 U/L (ref 15–41)
Albumin: 3.4 g/dL — ABNORMAL LOW (ref 3.5–5.0)
Alkaline Phosphatase: 128 U/L — ABNORMAL HIGH (ref 38–126)
Anion gap: 9 (ref 5–15)
BUN: 25 mg/dL — ABNORMAL HIGH (ref 8–23)
CO2: 25 mmol/L (ref 22–32)
Calcium: 8.8 mg/dL — ABNORMAL LOW (ref 8.9–10.3)
Chloride: 106 mmol/L (ref 98–111)
Creatinine, Ser: 1.68 mg/dL — ABNORMAL HIGH (ref 0.61–1.24)
GFR, Estimated: 40 mL/min — ABNORMAL LOW (ref >60)
Glucose, Bld: 95 mg/dL (ref 70–99)
Potassium: 4 mmol/L (ref 3.5–5.1)
Sodium: 140 mmol/L (ref 135–145)
Total Bilirubin: 2.2 mg/dL — ABNORMAL HIGH (ref 0.3–1.2)
Total Protein: 6.3 g/dL — ABNORMAL LOW (ref 6.5–8.1)

## 2023-05-14 LAB — PROTIME-INR
INR: 1.3 — ABNORMAL HIGH (ref 0.8–1.2)
Prothrombin Time: 16 s — ABNORMAL HIGH (ref 11.4–15.2)

## 2023-05-14 LAB — I-STAT CG4 LACTIC ACID, ED: Lactic Acid, Venous: 1.2 mmol/L (ref 0.5–1.9)

## 2023-05-14 LAB — APTT: aPTT: 30 s (ref 24–36)

## 2023-05-14 NOTE — ED Notes (Signed)
1st Lac in normal range, 2nd not needed and can be discontinued unless Dr says otherwise

## 2023-05-14 NOTE — ED Notes (Signed)
Pt removed IV while this RN in another pt's room.    This RN entered pt's room to check on pt and noticed IV fluid on floor with catheter hanging off bed.  Pt received about 500 mL of fluid.  Site slightly swollen; some blood noted from when pt removed IV but site no longer bleeding.  Pt assisted back to lying position and boosted in bed.  Pt covered back up with blanket at this time.

## 2023-05-14 NOTE — ED Notes (Signed)
Pt trying to get out of bed again.  Staff able to eventually redirect pt back to lying in bed.

## 2023-05-14 NOTE — Discharge Instructions (Signed)
You were evaluated in the Emergency Department and after careful evaluation, we did not find any emergent condition requiring admission or further testing in the hospital.  Your exam/testing today was overall reassuring.  Please return to the Emergency Department if you experience any worsening of your condition.  Thank you for allowing us to be a part of your care.  

## 2023-05-15 ENCOUNTER — Other Ambulatory Visit: Payer: Self-pay

## 2023-05-15 ENCOUNTER — Emergency Department (HOSPITAL_COMMUNITY): Payer: PPO

## 2023-05-15 ENCOUNTER — Emergency Department (HOSPITAL_COMMUNITY)
Admission: EM | Admit: 2023-05-15 | Discharge: 2023-05-16 | Disposition: A | Discharge status: Home / Self Care | Payer: PPO | Attending: Emergency Medicine | Admitting: Emergency Medicine

## 2023-05-15 DIAGNOSIS — R9089 Other abnormal findings on diagnostic imaging of central nervous system: Secondary | ICD-10-CM | POA: Diagnosis not present

## 2023-05-15 DIAGNOSIS — M47816 Spondylosis without myelopathy or radiculopathy, lumbar region: Secondary | ICD-10-CM | POA: Diagnosis not present

## 2023-05-15 DIAGNOSIS — S0993XA Unspecified injury of face, initial encounter: Secondary | ICD-10-CM

## 2023-05-15 DIAGNOSIS — I700 Atherosclerosis of aorta: Secondary | ICD-10-CM | POA: Diagnosis not present

## 2023-05-15 DIAGNOSIS — Z79899 Other long term (current) drug therapy: Secondary | ICD-10-CM | POA: Insufficient documentation

## 2023-05-15 DIAGNOSIS — I10 Essential (primary) hypertension: Secondary | ICD-10-CM | POA: Diagnosis not present

## 2023-05-15 DIAGNOSIS — J320 Chronic maxillary sinusitis: Secondary | ICD-10-CM | POA: Insufficient documentation

## 2023-05-15 DIAGNOSIS — I672 Cerebral atherosclerosis: Secondary | ICD-10-CM | POA: Diagnosis not present

## 2023-05-15 DIAGNOSIS — Z7901 Long term (current) use of anticoagulants: Secondary | ICD-10-CM | POA: Diagnosis not present

## 2023-05-15 DIAGNOSIS — R509 Fever, unspecified: Secondary | ICD-10-CM | POA: Diagnosis not present

## 2023-05-15 DIAGNOSIS — S0011XA Contusion of right eyelid and periocular area, initial encounter: Secondary | ICD-10-CM | POA: Insufficient documentation

## 2023-05-15 DIAGNOSIS — M160 Bilateral primary osteoarthritis of hip: Secondary | ICD-10-CM | POA: Diagnosis not present

## 2023-05-15 DIAGNOSIS — S299XXA Unspecified injury of thorax, initial encounter: Secondary | ICD-10-CM | POA: Diagnosis not present

## 2023-05-15 DIAGNOSIS — F0390 Unspecified dementia without behavioral disturbance: Secondary | ICD-10-CM | POA: Diagnosis not present

## 2023-05-15 DIAGNOSIS — Y92129 Unspecified place in nursing home as the place of occurrence of the external cause: Secondary | ICD-10-CM | POA: Insufficient documentation

## 2023-05-15 DIAGNOSIS — R410 Disorientation, unspecified: Secondary | ICD-10-CM | POA: Diagnosis not present

## 2023-05-15 DIAGNOSIS — S3993XA Unspecified injury of pelvis, initial encounter: Secondary | ICD-10-CM | POA: Diagnosis not present

## 2023-05-15 DIAGNOSIS — S0990XA Unspecified injury of head, initial encounter: Secondary | ICD-10-CM | POA: Diagnosis not present

## 2023-05-15 DIAGNOSIS — Z955 Presence of coronary angioplasty implant and graft: Secondary | ICD-10-CM | POA: Diagnosis not present

## 2023-05-15 LAB — I-STAT CHEM 8, ED
BUN: 33 mg/dL — ABNORMAL HIGH (ref 8–23)
Calcium, Ion: 0.97 mmol/L — ABNORMAL LOW (ref 1.15–1.40)
Chloride: 108 mmol/L (ref 98–111)
Creatinine, Ser: 1.6 mg/dL — ABNORMAL HIGH (ref 0.61–1.24)
Glucose, Bld: 93 mg/dL (ref 70–99)
HCT: 37 % — ABNORMAL LOW (ref 39.0–52.0)
Hemoglobin: 12.6 g/dL — ABNORMAL LOW (ref 13.0–17.0)
Potassium: 3.9 mmol/L (ref 3.5–5.1)
Sodium: 139 mmol/L (ref 135–145)
TCO2: 20 mmol/L — ABNORMAL LOW (ref 22–32)

## 2023-05-15 LAB — BLOOD CULTURE ID PANEL (REFLEXED) - BCID2

## 2023-05-15 LAB — CBC
HCT: 37.8 % — ABNORMAL LOW (ref 39.0–52.0)
Hemoglobin: 12.5 g/dL — ABNORMAL LOW (ref 13.0–17.0)
MCH: 32.8 pg (ref 26.0–34.0)
MCHC: 33.1 g/dL (ref 30.0–36.0)
MCV: 99.2 fL (ref 80.0–100.0)
Platelets: 170 10*3/uL (ref 150–400)
RBC: 3.81 MIL/uL — ABNORMAL LOW (ref 4.22–5.81)
RDW: 14.8 % (ref 11.5–15.5)
WBC: 6.5 10*3/uL (ref 4.0–10.5)
nRBC: 0 % (ref 0.0–0.2)

## 2023-05-15 LAB — I-STAT CG4 LACTIC ACID, ED: Lactic Acid, Venous: 1.3 mmol/L (ref 0.5–1.9)

## 2023-05-15 LAB — ETHANOL: Alcohol, Ethyl (B): <10 mg/dL (ref <10)

## 2023-05-15 LAB — COMPREHENSIVE METABOLIC PANEL
ALT: 38 U/L (ref 0–44)
AST: 38 U/L (ref 15–41)
Albumin: 3.1 g/dL — ABNORMAL LOW (ref 3.5–5.0)
Alkaline Phosphatase: 117 U/L (ref 38–126)
Anion gap: 15 (ref 5–15)
BUN: 29 mg/dL — ABNORMAL HIGH (ref 8–23)
CO2: 21 mmol/L — ABNORMAL LOW (ref 22–32)
Calcium: 8.5 mg/dL — ABNORMAL LOW (ref 8.9–10.3)
Chloride: 105 mmol/L (ref 98–111)
Creatinine, Ser: 1.77 mg/dL — ABNORMAL HIGH (ref 0.61–1.24)
GFR, Estimated: 37 mL/min — ABNORMAL LOW (ref >60)
Glucose, Bld: 96 mg/dL (ref 70–99)
Potassium: 3.9 mmol/L (ref 3.5–5.1)
Sodium: 141 mmol/L (ref 135–145)
Total Bilirubin: 1.4 mg/dL — ABNORMAL HIGH (ref 0.3–1.2)
Total Protein: 5.9 g/dL — ABNORMAL LOW (ref 6.5–8.1)

## 2023-05-15 LAB — SAMPLE TO BLOOD BANK

## 2023-05-15 LAB — PROTIME-INR
INR: 1.4 — ABNORMAL HIGH (ref 0.8–1.2)
Prothrombin Time: 17.5 s — ABNORMAL HIGH (ref 11.4–15.2)
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
## 2023-05-15 NOTE — ED Provider Notes (Signed)
Received patient in turnover from Dr. Renaye Rakers.  Please see their note for further details of Hx, PE.  Briefly patient is a 87 y.o. male with a Level 2 : Facial Injury /On Eliquis .  Plan for CT imaging of the head and face.  CT imaging without intracranial hemorrhage or acute fracture.  Will discharge home.    Melene Plan, DO 05/16/23 [phone]
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
## 2023-05-15 NOTE — Discharge Instructions (Addendum)
Giuseppe's scans and x-rays did not show any medical emergencies, signs of fracture broken bone, or bleeding in the skull.  His blood test were reassuring and did not show signs of infection.  He had an infectious workup performed in the ER yesterday including blood cultures and urine sample which did not show signs of infection at this time.  We would recommend that he follow-up with his primary physician.
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
## 2023-05-16 DIAGNOSIS — R456 Violent behavior: Secondary | ICD-10-CM | POA: Diagnosis not present

## 2023-05-16 DIAGNOSIS — R410 Disorientation, unspecified: Secondary | ICD-10-CM | POA: Diagnosis not present

## 2023-05-16 DIAGNOSIS — I10 Essential (primary) hypertension: Secondary | ICD-10-CM | POA: Diagnosis not present

## 2023-05-16 DIAGNOSIS — F03911 Unspecified dementia, unspecified severity, with agitation: Secondary | ICD-10-CM | POA: Diagnosis not present

## 2023-05-16 DIAGNOSIS — R451 Restlessness and agitation: Secondary | ICD-10-CM | POA: Diagnosis not present

## 2023-05-16 DIAGNOSIS — R509 Fever, unspecified: Secondary | ICD-10-CM | POA: Diagnosis not present

## 2023-05-16 DIAGNOSIS — R4182 Altered mental status, unspecified: Secondary | ICD-10-CM | POA: Diagnosis not present

## 2023-05-16 LAB — CULTURE, BLOOD (ROUTINE X 2): Special Requests: ADEQUATE

## 2023-05-16 NOTE — ED Notes (Addendum)
Report given to Steve Gordon at [facility] on patient's status and discharge / Spouse [name]) notified on patient's condition and discharge . [facility] notified by Diplomatic Services operational officer for transport .

## 2023-05-16 NOTE — ED Notes (Signed)
Called [facility] to transport patient to [facility]

## 2023-05-17 DIAGNOSIS — I959 Hypotension, unspecified: Secondary | ICD-10-CM | POA: Diagnosis not present

## 2023-05-18 DIAGNOSIS — F419 Anxiety disorder, unspecified: Secondary | ICD-10-CM | POA: Diagnosis not present

## 2023-05-18 DIAGNOSIS — W010XXD Fall on same level from slipping, tripping and stumbling without subsequent striking against object, subsequent encounter: Secondary | ICD-10-CM | POA: Diagnosis not present

## 2023-05-18 DIAGNOSIS — I1310 Hypertensive heart and chronic kidney disease without heart failure, with stage 1 through stage 4 chronic kidney disease, or unspecified chronic kidney disease: Secondary | ICD-10-CM | POA: Diagnosis not present

## 2023-05-18 DIAGNOSIS — N183 Chronic kidney disease, stage 3 unspecified: Secondary | ICD-10-CM | POA: Diagnosis not present

## 2023-05-18 DIAGNOSIS — F03C11 Unspecified dementia, severe, with agitation: Secondary | ICD-10-CM | POA: Diagnosis not present

## 2023-05-19 LAB — CULTURE, BLOOD (ROUTINE X 2): Culture: NO GROWTH

## 2023-05-25 DIAGNOSIS — I10 Essential (primary) hypertension: Secondary | ICD-10-CM | POA: Diagnosis not present

## 2023-05-25 DIAGNOSIS — I130 Hypertensive heart and chronic kidney disease with heart failure and stage 1 through stage 4 chronic kidney disease, or unspecified chronic kidney disease: Secondary | ICD-10-CM | POA: Diagnosis not present

## 2023-05-27 ENCOUNTER — Emergency Department (HOSPITAL_COMMUNITY): Payer: PPO

## 2023-05-27 ENCOUNTER — Emergency Department (HOSPITAL_COMMUNITY)
Admission: EM | Admit: 2023-05-27 | Discharge: 2023-05-27 | Disposition: A | Discharge status: Home / Self Care | Payer: PPO | Source: Home / Self Care | Attending: Emergency Medicine | Admitting: Emergency Medicine

## 2023-05-27 ENCOUNTER — Other Ambulatory Visit: Payer: Self-pay

## 2023-05-27 ENCOUNTER — Encounter (HOSPITAL_COMMUNITY): Payer: Self-pay | Admitting: *Deleted

## 2023-05-27 DIAGNOSIS — R220 Localized swelling, mass and lump, head: Secondary | ICD-10-CM | POA: Diagnosis not present

## 2023-05-27 DIAGNOSIS — S0101XA Laceration without foreign body of scalp, initial encounter: Secondary | ICD-10-CM | POA: Diagnosis not present

## 2023-05-27 DIAGNOSIS — E041 Nontoxic single thyroid nodule: Secondary | ICD-10-CM | POA: Diagnosis not present

## 2023-05-27 DIAGNOSIS — R229 Localized swelling, mass and lump, unspecified: Secondary | ICD-10-CM | POA: Diagnosis not present

## 2023-05-27 DIAGNOSIS — S0083XA Contusion of other part of head, initial encounter: Secondary | ICD-10-CM | POA: Insufficient documentation

## 2023-05-27 DIAGNOSIS — M1812 Unilateral primary osteoarthritis of first carpometacarpal joint, left hand: Secondary | ICD-10-CM | POA: Diagnosis not present

## 2023-05-27 DIAGNOSIS — Z7901 Long term (current) use of anticoagulants: Secondary | ICD-10-CM | POA: Insufficient documentation

## 2023-05-27 DIAGNOSIS — Y92129 Unspecified place in nursing home as the place of occurrence of the external cause: Secondary | ICD-10-CM | POA: Insufficient documentation

## 2023-05-27 DIAGNOSIS — G319 Degenerative disease of nervous system, unspecified: Secondary | ICD-10-CM | POA: Diagnosis not present

## 2023-05-27 DIAGNOSIS — I10 Essential (primary) hypertension: Secondary | ICD-10-CM | POA: Diagnosis not present

## 2023-05-27 DIAGNOSIS — S0990XA Unspecified injury of head, initial encounter: Secondary | ICD-10-CM | POA: Diagnosis not present

## 2023-05-27 DIAGNOSIS — S0993XA Unspecified injury of face, initial encounter: Secondary | ICD-10-CM | POA: Diagnosis not present

## 2023-05-27 DIAGNOSIS — S199XXA Unspecified injury of neck, initial encounter: Secondary | ICD-10-CM | POA: Diagnosis not present

## 2023-05-27 DIAGNOSIS — W19XXXA Unspecified fall, initial encounter: Secondary | ICD-10-CM | POA: Diagnosis not present

## 2023-05-27 DIAGNOSIS — Z79899 Other long term (current) drug therapy: Secondary | ICD-10-CM | POA: Insufficient documentation

## 2023-05-27 DIAGNOSIS — S022XXA Fracture of nasal bones, initial encounter for closed fracture: Secondary | ICD-10-CM | POA: Diagnosis not present

## 2023-05-27 DIAGNOSIS — M79642 Pain in left hand: Secondary | ICD-10-CM | POA: Diagnosis not present

## 2023-05-27 DIAGNOSIS — S299XXA Unspecified injury of thorax, initial encounter: Secondary | ICD-10-CM | POA: Diagnosis not present

## 2023-05-27 DIAGNOSIS — S3993XA Unspecified injury of pelvis, initial encounter: Secondary | ICD-10-CM | POA: Diagnosis not present

## 2023-05-27 DIAGNOSIS — Z955 Presence of coronary angioplasty implant and graft: Secondary | ICD-10-CM | POA: Diagnosis not present
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
## 2023-05-27 NOTE — Progress Notes (Signed)
Orthopedic Tech Progress Note Patient Details:  JAYDN SMELLEY June 02, 1936 [id number]  Level 2 trauma   Patient ID: [name], male   DOB: 06-23-1936, 87 y.o.   MRN: [id number]  Donald Pore 05/27/2023, 5:51 AM
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
## 2023-05-27 NOTE — ED Notes (Signed)
Attempted to call [facility] [phone] x [phone] no answer
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
## 2023-05-28 ENCOUNTER — Encounter (HOSPITAL_COMMUNITY): Payer: Self-pay

## 2023-05-28 ENCOUNTER — Emergency Department (HOSPITAL_COMMUNITY): Payer: PPO

## 2023-05-28 ENCOUNTER — Emergency Department (HOSPITAL_COMMUNITY)
Admission: EM | Admit: 2023-05-28 | Discharge: 2023-05-28 | Disposition: A | Discharge status: Home / Self Care | Payer: PPO | Attending: Emergency Medicine | Admitting: Emergency Medicine

## 2023-05-28 ENCOUNTER — Other Ambulatory Visit: Payer: Self-pay

## 2023-05-28 DIAGNOSIS — R58 Hemorrhage, not elsewhere classified: Secondary | ICD-10-CM | POA: Diagnosis not present

## 2023-05-28 DIAGNOSIS — F0390 Unspecified dementia without behavioral disturbance: Secondary | ICD-10-CM | POA: Insufficient documentation

## 2023-05-28 DIAGNOSIS — S0990XA Unspecified injury of head, initial encounter: Secondary | ICD-10-CM | POA: Diagnosis not present

## 2023-05-28 DIAGNOSIS — S51812A Laceration without foreign body of left forearm, initial encounter: Secondary | ICD-10-CM | POA: Insufficient documentation

## 2023-05-28 DIAGNOSIS — Z23 Encounter for immunization: Secondary | ICD-10-CM | POA: Insufficient documentation

## 2023-05-28 DIAGNOSIS — E041 Nontoxic single thyroid nodule: Secondary | ICD-10-CM | POA: Diagnosis not present

## 2023-05-28 DIAGNOSIS — S60512A Abrasion of left hand, initial encounter: Secondary | ICD-10-CM | POA: Insufficient documentation

## 2023-05-28 DIAGNOSIS — S0101XA Laceration without foreign body of scalp, initial encounter: Secondary | ICD-10-CM | POA: Insufficient documentation

## 2023-05-28 DIAGNOSIS — Z7901 Long term (current) use of anticoagulants: Secondary | ICD-10-CM | POA: Insufficient documentation

## 2023-05-28 DIAGNOSIS — M1812 Unilateral primary osteoarthritis of first carpometacarpal joint, left hand: Secondary | ICD-10-CM | POA: Diagnosis not present

## 2023-05-28 DIAGNOSIS — I4891 Unspecified atrial fibrillation: Secondary | ICD-10-CM | POA: Insufficient documentation

## 2023-05-28 DIAGNOSIS — S0993XA Unspecified injury of face, initial encounter: Secondary | ICD-10-CM | POA: Diagnosis not present

## 2023-05-28 DIAGNOSIS — S299XXA Unspecified injury of thorax, initial encounter: Secondary | ICD-10-CM | POA: Diagnosis not present

## 2023-05-28 DIAGNOSIS — I10 Essential (primary) hypertension: Secondary | ICD-10-CM | POA: Diagnosis not present

## 2023-05-28 DIAGNOSIS — S199XXA Unspecified injury of neck, initial encounter: Secondary | ICD-10-CM | POA: Diagnosis not present

## 2023-05-28 DIAGNOSIS — M79642 Pain in left hand: Secondary | ICD-10-CM | POA: Diagnosis not present

## 2023-05-28 MED ORDER — LIDOCAINE HCL (PF) 1 % IJ SOLN
30.0000 mL | Freq: Once | INTRAMUSCULAR | Status: AC
  Administered 2023-05-28: 30 mL
  Filled 2023-05-28: qty 30

## 2023-05-28 MED ORDER — TETANUS-DIPHTH-ACELL PERTUSSIS 5-2.5-18.5 LF-MCG/0.5 IM SUSY
0.5000 mL | PREFILLED_SYRINGE | Freq: Once | INTRAMUSCULAR | Status: AC
  Administered 2023-05-28: 0.5 mL via INTRAMUSCULAR
  Filled 2023-05-28: qty 0.5

## 2023-05-28 MED ORDER — LORAZEPAM 0.5 MG PO TABS
0.5000 mg | ORAL_TABLET | Freq: Once | ORAL | Status: AC
  Administered 2023-05-28: 0.5 mg via ORAL
  Filled 2023-05-28: qty 1
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
## 2023-05-28 NOTE — Progress Notes (Signed)
Orthopedic Tech Progress Note Patient Details:  Steve Gordon January 15, 1936 [id number]  Patient ID: Steve Gordon, Steve Gordon   DOB: Dec 15, 1935, 87 y.o.   MRN: [id number] Level II; not currently needed. Darleen Crocker 05/28/2023, 3:05 PM

## 2023-05-28 NOTE — ED Notes (Signed)
PT ASSIGNED TO HALL BED 25 TO BE MONITORED/OBSERVED UNTIL PTAR ARRIVES

## 2023-05-28 NOTE — Discharge Instructions (Signed)
Staples need to be removed in about 9 to 10 days.  Skin tear to the left wrist can place Neosporin or bacitracin twice daily with a wrap.  Do not scrub aggressively over staple sites but okay for gentle soap and water in the shower with making sure this is nice and dry afterwards.

## 2023-05-28 NOTE — ED Notes (Signed)
This RN attempted to call report to [facility], no answer.
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
## 2023-05-28 NOTE — ED Notes (Signed)
Patient transported to CT 

## 2023-05-29 ENCOUNTER — Emergency Department (HOSPITAL_COMMUNITY): Payer: PPO

## 2023-05-29 ENCOUNTER — Inpatient Hospital Stay (HOSPITAL_COMMUNITY)
Admission: EM | Admit: 2023-05-29 | Discharge: 2023-06-01 | DRG: 155 | Disposition: A | Discharge status: Hospice | Payer: PPO | Source: Skilled Nursing Facility | Attending: Family Medicine | Admitting: Family Medicine

## 2023-05-29 ENCOUNTER — Encounter (HOSPITAL_COMMUNITY): Payer: Self-pay

## 2023-05-29 DIAGNOSIS — Z7189 Other specified counseling: Secondary | ICD-10-CM | POA: Diagnosis not present

## 2023-05-29 DIAGNOSIS — Z79899 Other long term (current) drug therapy: Secondary | ICD-10-CM

## 2023-05-29 DIAGNOSIS — F0280 Dementia in other diseases classified elsewhere without behavioral disturbance: Secondary | ICD-10-CM | POA: Insufficient documentation

## 2023-05-29 DIAGNOSIS — Z23 Encounter for immunization: Secondary | ICD-10-CM

## 2023-05-29 DIAGNOSIS — R569 Unspecified convulsions: Secondary | ICD-10-CM | POA: Diagnosis not present

## 2023-05-29 DIAGNOSIS — R2689 Other abnormalities of gait and mobility: Secondary | ICD-10-CM | POA: Diagnosis present

## 2023-05-29 DIAGNOSIS — S0083XA Contusion of other part of head, initial encounter: Secondary | ICD-10-CM | POA: Diagnosis present

## 2023-05-29 DIAGNOSIS — Z043 Encounter for examination and observation following other accident: Secondary | ICD-10-CM | POA: Diagnosis not present

## 2023-05-29 DIAGNOSIS — Z781 Physical restraint status: Secondary | ICD-10-CM

## 2023-05-29 DIAGNOSIS — Z66 Do not resuscitate: Secondary | ICD-10-CM | POA: Diagnosis present

## 2023-05-29 DIAGNOSIS — T07XXXA Unspecified multiple injuries, initial encounter: Secondary | ICD-10-CM | POA: Diagnosis not present

## 2023-05-29 DIAGNOSIS — I2510 Atherosclerotic heart disease of native coronary artery without angina pectoris: Secondary | ICD-10-CM | POA: Diagnosis present

## 2023-05-29 DIAGNOSIS — Z9181 History of falling: Secondary | ICD-10-CM

## 2023-05-29 DIAGNOSIS — S0101XA Laceration without foreign body of scalp, initial encounter: Secondary | ICD-10-CM | POA: Diagnosis present

## 2023-05-29 DIAGNOSIS — W1830XA Fall on same level, unspecified, initial encounter: Secondary | ICD-10-CM | POA: Diagnosis present

## 2023-05-29 DIAGNOSIS — R778 Other specified abnormalities of plasma proteins: Secondary | ICD-10-CM | POA: Diagnosis not present

## 2023-05-29 DIAGNOSIS — R229 Localized swelling, mass and lump, unspecified: Secondary | ICD-10-CM | POA: Diagnosis present

## 2023-05-29 DIAGNOSIS — R7989 Other specified abnormal findings of blood chemistry: Secondary | ICD-10-CM

## 2023-05-29 DIAGNOSIS — Z951 Presence of aortocoronary bypass graft: Secondary | ICD-10-CM | POA: Diagnosis not present

## 2023-05-29 DIAGNOSIS — N1831 Chronic kidney disease, stage 3a: Secondary | ICD-10-CM | POA: Diagnosis not present

## 2023-05-29 DIAGNOSIS — G3183 Dementia with Lewy bodies: Secondary | ICD-10-CM | POA: Diagnosis not present

## 2023-05-29 DIAGNOSIS — I482 Chronic atrial fibrillation, unspecified: Secondary | ICD-10-CM | POA: Diagnosis present

## 2023-05-29 DIAGNOSIS — I2489 Other forms of acute ischemic heart disease: Secondary | ICD-10-CM | POA: Diagnosis not present

## 2023-05-29 DIAGNOSIS — E440 Moderate protein-calorie malnutrition: Secondary | ICD-10-CM | POA: Diagnosis present

## 2023-05-29 DIAGNOSIS — S022XXA Fracture of nasal bones, initial encounter for closed fracture: Principal | ICD-10-CM | POA: Diagnosis present

## 2023-05-29 DIAGNOSIS — Z515 Encounter for palliative care: Secondary | ICD-10-CM | POA: Diagnosis not present

## 2023-05-29 DIAGNOSIS — Y92098 Other place in other non-institutional residence as the place of occurrence of the external cause: Secondary | ICD-10-CM | POA: Diagnosis not present

## 2023-05-29 DIAGNOSIS — R627 Adult failure to thrive: Secondary | ICD-10-CM | POA: Diagnosis not present

## 2023-05-29 DIAGNOSIS — S50812A Abrasion of left forearm, initial encounter: Secondary | ICD-10-CM | POA: Diagnosis not present

## 2023-05-29 DIAGNOSIS — W19XXXA Unspecified fall, initial encounter: Secondary | ICD-10-CM | POA: Diagnosis not present

## 2023-05-29 DIAGNOSIS — S022XXB Fracture of nasal bones, initial encounter for open fracture: Secondary | ICD-10-CM

## 2023-05-29 DIAGNOSIS — Z955 Presence of coronary angioplasty implant and graft: Secondary | ICD-10-CM | POA: Diagnosis not present

## 2023-05-29 DIAGNOSIS — I878 Other specified disorders of veins: Secondary | ICD-10-CM | POA: Diagnosis not present

## 2023-05-29 DIAGNOSIS — F02C11 Dementia in other diseases classified elsewhere, severe, with agitation: Secondary | ICD-10-CM | POA: Diagnosis present

## 2023-05-29 DIAGNOSIS — G319 Degenerative disease of nervous system, unspecified: Secondary | ICD-10-CM | POA: Diagnosis not present

## 2023-05-29 DIAGNOSIS — R220 Localized swelling, mass and lump, head: Secondary | ICD-10-CM | POA: Diagnosis not present

## 2023-05-29 DIAGNOSIS — E162 Hypoglycemia, unspecified: Secondary | ICD-10-CM | POA: Diagnosis present

## 2023-05-29 DIAGNOSIS — S0121XA Laceration without foreign body of nose, initial encounter: Secondary | ICD-10-CM | POA: Diagnosis present

## 2023-05-29 DIAGNOSIS — E785 Hyperlipidemia, unspecified: Secondary | ICD-10-CM | POA: Diagnosis present

## 2023-05-29 DIAGNOSIS — I129 Hypertensive chronic kidney disease with stage 1 through stage 4 chronic kidney disease, or unspecified chronic kidney disease: Secondary | ICD-10-CM | POA: Diagnosis not present

## 2023-05-29 DIAGNOSIS — Z7901 Long term (current) use of anticoagulants: Secondary | ICD-10-CM

## 2023-05-29 DIAGNOSIS — I10 Essential (primary) hypertension: Secondary | ICD-10-CM | POA: Diagnosis not present

## 2023-05-29 LAB — CBC WITH DIFFERENTIAL/PLATELET
Abs Immature Granulocytes: 0.05 10*3/uL (ref 0.00–0.07)
Basophils Absolute: 0 10*3/uL (ref 0.0–0.1)
Basophils Relative: 0 %
Eosinophils Absolute: 0.1 10*3/uL (ref 0.0–0.5)
Eosinophils Relative: 2 %
HCT: 38.4 % — ABNORMAL LOW (ref 39.0–52.0)
Hemoglobin: 12.5 g/dL — ABNORMAL LOW (ref 13.0–17.0)
Immature Granulocytes: 1 %
Lymphocytes Relative: 16 %
Lymphs Abs: 1.4 10*3/uL (ref 0.7–4.0)
MCH: 32.5 pg (ref 26.0–34.0)
MCHC: 32.6 g/dL (ref 30.0–36.0)
MCV: 99.7 fL (ref 80.0–100.0)
Monocytes Absolute: 1 10*3/uL (ref 0.1–1.0)
Monocytes Relative: 12 %
Neutro Abs: 5.9 10*3/uL (ref 1.7–7.7)
Neutrophils Relative %: 69 %
Platelets: 195 10*3/uL (ref 150–400)
RBC: 3.85 MIL/uL — ABNORMAL LOW (ref 4.22–5.81)
RDW: 15.6 % — ABNORMAL HIGH (ref 11.5–15.5)
WBC: 8.5 10*3/uL (ref 4.0–10.5)
nRBC: 0 % (ref 0.0–0.2)

## 2023-05-29 LAB — I-STAT CHEM 8, ED
BUN: 29 mg/dL — ABNORMAL HIGH (ref 8–23)
Calcium, Ion: 1.14 mmol/L — ABNORMAL LOW (ref 1.15–1.40)
Chloride: 110 mmol/L (ref 98–111)
Creatinine, Ser: 1.4 mg/dL — ABNORMAL HIGH (ref 0.61–1.24)
Glucose, Bld: 94 mg/dL (ref 70–99)
HCT: 39 % (ref 39.0–52.0)
Hemoglobin: 13.3 g/dL (ref 13.0–17.0)
Potassium: 4.3 mmol/L (ref 3.5–5.1)
Sodium: 145 mmol/L (ref 135–145)
TCO2: 23 mmol/L (ref 22–32)

## 2023-05-29 LAB — COMPREHENSIVE METABOLIC PANEL
ALT: 49 U/L — ABNORMAL HIGH (ref 0–44)
AST: 43 U/L — ABNORMAL HIGH (ref 15–41)
Albumin: 3.7 g/dL (ref 3.5–5.0)
Alkaline Phosphatase: 131 U/L — ABNORMAL HIGH (ref 38–126)
Anion gap: 12 (ref 5–15)
BUN: 24 mg/dL — ABNORMAL HIGH (ref 8–23)
CO2: 22 mmol/L (ref 22–32)
Calcium: 9.1 mg/dL (ref 8.9–10.3)
Chloride: 110 mmol/L (ref 98–111)
Creatinine, Ser: 1.4 mg/dL — ABNORMAL HIGH (ref 0.61–1.24)
GFR, Estimated: 49 mL/min — ABNORMAL LOW (ref >60)
Glucose, Bld: 93 mg/dL (ref 70–99)
Potassium: 4.3 mmol/L (ref 3.5–5.1)
Sodium: 144 mmol/L (ref 135–145)
Total Bilirubin: 2.4 mg/dL — ABNORMAL HIGH (ref 0.3–1.2)
Total Protein: 6.7 g/dL (ref 6.5–8.1)

## 2023-05-29 LAB — AMMONIA: Ammonia: <10 umol/L (ref 9–35)

## 2023-05-29 LAB — RAPID URINE DRUG SCREEN, HOSP PERFORMED
Amphetamines: NOT DETECTED
Barbiturates: NOT DETECTED
Benzodiazepines: NOT DETECTED
Cocaine: NOT DETECTED
Opiates: NOT DETECTED
Tetrahydrocannabinol: NOT DETECTED

## 2023-05-29 LAB — PROTIME-INR
INR: 1.1 (ref 0.8–1.2)
Prothrombin Time: 14.5 s (ref 11.4–15.2)

## 2023-05-29 LAB — CK: Total CK: 129 U/L (ref 49–397)

## 2023-05-29 LAB — MAGNESIUM: Magnesium: 2 mg/dL (ref 1.7–2.4)

## 2023-05-29 LAB — I-STAT CG4 LACTIC ACID, ED: Lactic Acid, Venous: 1.6 mmol/L (ref 0.5–1.9)

## 2023-05-29 LAB — TROPONIN I (HIGH SENSITIVITY)
Troponin I (High Sensitivity): 56 ng/L — ABNORMAL HIGH (ref <18)
Troponin I (High Sensitivity): 57 ng/L — ABNORMAL HIGH (ref <18)
Troponin I (High Sensitivity): 48 ng/L — ABNORMAL HIGH (ref <18)

## 2023-05-29 LAB — VALPROIC ACID LEVEL: Valproic Acid Lvl: 24 ug/mL — ABNORMAL LOW (ref 50.0–100.0)

## 2023-05-29 MED ORDER — DIVALPROEX SODIUM 125 MG PO CSDR
250.0000 mg | DELAYED_RELEASE_CAPSULE | Freq: Three times a day (TID) | ORAL | Status: DC
  Administered 2023-05-29 – 2023-06-01 (×7): 250 mg via ORAL
  Filled 2023-05-29 (×7): qty 2

## 2023-05-29 MED ORDER — LACTATED RINGERS IV BOLUS
1000.0000 mL | Freq: Once | INTRAVENOUS | Status: AC
  Administered 2023-05-29: 1000 mL via INTRAVENOUS

## 2023-05-29 MED ORDER — ATORVASTATIN CALCIUM 40 MG PO TABS
40.0000 mg | ORAL_TABLET | Freq: Every day | ORAL | Status: DC
  Administered 2023-05-29 – 2023-05-31 (×3): 40 mg via ORAL
  Filled 2023-05-29 (×3): qty 1

## 2023-05-29 MED ORDER — DIVALPROEX SODIUM 250 MG PO DR TAB
250.0000 mg | DELAYED_RELEASE_TABLET | Freq: Three times a day (TID) | ORAL | Status: DC
  Administered 2023-05-29 (×2): 250 mg via ORAL
  Filled 2023-05-29 (×2): qty 1

## 2023-05-29 MED ORDER — SODIUM CHLORIDE 0.9 % IV SOLN
3.0000 g | Freq: Once | INTRAVENOUS | Status: AC
  Administered 2023-05-29: 3 g via INTRAVENOUS
  Filled 2023-05-29: qty 8

## 2023-05-29 MED ORDER — QUETIAPINE FUMARATE 25 MG PO TABS
25.0000 mg | ORAL_TABLET | Freq: Every day | ORAL | Status: DC | PRN
  Administered 2023-05-29: 25 mg via ORAL
  Filled 2023-05-29: qty 1

## 2023-05-29 MED ORDER — QUETIAPINE FUMARATE 25 MG PO TABS
25.0000 mg | ORAL_TABLET | Freq: Once | ORAL | Status: AC
  Administered 2023-05-29: 25 mg via ORAL
  Filled 2023-05-29: qty 1

## 2023-05-29 MED ORDER — LIDOCAINE-EPINEPHRINE-TETRACAINE (LET) TOPICAL GEL
3.0000 mL | Freq: Once | TOPICAL | Status: AC
  Administered 2023-05-29: 3 mL via TOPICAL
  Filled 2023-05-29: qty 3

## 2023-05-29 MED ORDER — METOPROLOL TARTRATE 12.5 MG HALF TABLET
12.5000 mg | ORAL_TABLET | Freq: Two times a day (BID) | ORAL | Status: DC
  Administered 2023-05-29 – 2023-06-01 (×7): 12.5 mg via ORAL
  Filled 2023-05-29 (×7): qty 1

## 2023-05-29 MED ORDER — ACETAMINOPHEN 325 MG PO TABS: 650.0000 mg | ORAL_TABLET | Freq: Four times a day (QID) | ORAL | Status: DC | PRN

## 2023-05-29 MED ORDER — QUETIAPINE FUMARATE 25 MG PO TABS
50.0000 mg | ORAL_TABLET | Freq: Two times a day (BID) | ORAL | Status: DC
  Administered 2023-05-29 – 2023-06-01 (×6): 50 mg via ORAL
  Filled 2023-05-29 (×6): qty 2

## 2023-05-29 MED ORDER — OXYMETAZOLINE HCL 0.05 % NA SOLN
1.0000 | Freq: Once | NASAL | Status: DC
  Filled 2023-05-29: qty 30

## 2023-05-29 MED ORDER — QUETIAPINE FUMARATE 25 MG PO TABS: 50.0000 mg | ORAL_TABLET | Freq: Every day | ORAL | Status: DC | PRN

## 2023-05-29 NOTE — Assessment & Plan Note (Addendum)
Patient appears stable at this time. Reassuringly no bony abnormalities or intracranial process on imaging. We will begin workup for unwitnessed fall, but this is likely multifactorial in setting of deconditioning, dementia, and medication use. -Admit to med telemetry unit, Levert Feinstein, MD attending -PT OT eval, obtain orthostatics -Nutrition consult -Neurology consult to evaluate for Parkinson's -Can consider MRI though patient has metal staples in his head from previous fall -Hold Depakote, lorazepam.  Schedule seroquel 50 mg BID after curbsiding neurology, appreciate recommendations -Delirium precautions -Repeat EKG -Trend troponins -CBG, CK, valproic acid level -UDS, UA -Repeat CMP, CBC in AM

## 2023-05-29 NOTE — Plan of Care (Signed)
Family Medicine will be admitting this patient

## 2023-05-29 NOTE — ED Notes (Signed)
PT well appearing upon transport to floor.

## 2023-05-29 NOTE — Hospital Course (Signed)
Steve Gordon is a 87 y.o. male who was admitted to the [facility] Medicine Teaching Service at [facility] for unwitnessed fall and nasal fracture. Hospital course is outlined below by problem.   Nasal fracture Patient had an unwitnessed fall at his memory care facility.  In the ED head and maxillofacial CT showed bilateral nasal bone fractures.  He was initially treated with IV Unasyn, though this was discontinued after ENT was consulted determined patient did not require further antibiotics.  They will plan to see the patient in follow-up after discharge.  Dementia Patient with several episodes of aggression and combativeness during hospitalization.  He was frequently redirectable.  Did wear mitts.  Continued on home Seroquel 50 mg twice daily Seroquel as needed***.  Restraints were***not used.  Palliative was ultimately consulted at the family's wishes***  Elevated troponin Troponins were initially 56 but plateaued and ultimately down trended.  Very likely demand ischemia.  Patient remained stable throughout the rest of his hospitalization.  Other conditions that were chronic and stable:  HLD-continue home atorvastatin 40 mg HTN-BP acceptable, continue home metoprolol 25 mg Chronic Afib-Not in RVR on EKG and has stable HR, continue home metoprolol 25 mg CKD 3a- monitor BMPs, appears improved from prior Cr check Dementia-Seroquel as needed, stopping scheduled seroquel and prn as above  Issues for follow up: Follow-up with ENT outpatient concerning broken nose

## 2023-05-29 NOTE — TOC CAGE-AID Note (Signed)
Transition of Care [facility]) - CAGE-AID Screening  Patient Details  Name: Steve Gordon MRN: [id number] Date of Birth: [birth year]  Clinical Narrative:  Patient with baseline dementia, oriented only to name. Patient unable to participate in substance abuse screening at this time.  CAGE-AID Screening: Substance Abuse Screening unable to be completed due to: : Patient unable to participate

## 2023-05-29 NOTE — Plan of Care (Signed)
FMTS Interim Progress Note  S: Contacted around 6:30 AM for admission by ED provider Dr. Clayborne Dana.  In short, patient is a 87 year old male with history of significant cognitive decline due to dementia with repeated falls on blood thinners and trauma resulting in multiple ED visits.  Here today patient has multiple head lacerations which have since been stapled along with broken but nondeviated septum.  Patient has additional multiple lab abnormalities.  Patient pleasantly demented on exam.  In no distress, answering questions.  O: BP (!) 142/64 (BP Location: Left Arm)   Pulse 91   Temp (!) 97.5 F (36.4 C) (Axillary)   Resp 18   Ht 5\' 8"  (1.727 m)   Wt 75 kg   SpO2 98%   BMI 25.14 kg/m   General: NAD resting comfortably in hospital bed Head: Multiple lacerations with staples, nose Vaseline applied with mild swelling and bruising Neuro: A&O, not oriented to situation Cardiovascular: RRR, no murmurs, no peripheral edema Respiratory: normal WOB on RA, CTAB, no wheezes, ronchi or rales Extremities: Moving all 4 extremities equally   A/P: Family medicine teaching service will admit the patient.  Stable for day team to admit.  Please contact our intern service pager at [phone] for any questions regarding this patient.  Celine Mans, MD 05/29/2023, 6:59 AM PGY-2, [facility] Family Medicine Service pager [phone]

## 2023-05-29 NOTE — ED Triage Notes (Signed)
Pt BIB [facility] from [facility] with c/o unwitnessed fall. Pt recently seen for previous fall. Wounds from previous fall on head are stapled. Blood oozing from staples. New wound to bridge of nose. Nose was bleeding but controlled on arrival. Multiple skin tears noted.   EMS vitals: BP 104/62 HR 94 O2 97%

## 2023-05-29 NOTE — Progress Notes (Signed)
FMTS Brief Progress Note  S:  Earlier in the evening, team was contacted by nursing, noting agitation/trying to get mitts off.  Nighttime scheduled Seroquel/metoprolol given earlier.  On interview, patient is sleeping soundly in bed.  Appears comfortable.  Safety mittens on.  Interview deferred at this time.  Telemetry reviewed: Patient in atrial fibrillation without RVR (HR: 107).  O: BP (!) 145/93 (BP Location: Right Arm)   Pulse (!) 109   Temp 98.1 F (36.7 C) (Oral)   Resp 17   Ht 5\' 8"  (1.727 m)   Wt 75 kg   SpO2 100%   BMI 25.14 kg/m    Constitutional: Elderly adult male lying on side in bed, no acute distress. Cardiovascular: Telemetry reviewed, stable A-fib. Respiratory: No increased work of breathing. GI: Could not visualize under blanket. MSK: Deferred. Neuro: Deferred. Psych: Deferred.  A/P: Agitation Patient has severe dementia which presents with agitation at baseline, see daytime H&P.  He received scheduled medications earlier in the night.  Appears to have successfully calmed patient down enough to sleep.  Delirium precautions in place. - We will refrain from further sedating PRNs/restraints at this time.  However, low threshold for adding PRN quetiapine.  Fall No new concerns at this time. - We will check a.m. BMP/CBC/magnesium. - Workup per day team plan  Tomie China, MD 05/29/2023, 10:01 PM PGY-1, [facility] Family Medicine Night Resident  Please page [phone] with questions.
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
## 2023-05-30 DIAGNOSIS — S022XXA Fracture of nasal bones, initial encounter for closed fracture: Secondary | ICD-10-CM | POA: Insufficient documentation

## 2023-05-30 DIAGNOSIS — R7989 Other specified abnormal findings of blood chemistry: Secondary | ICD-10-CM

## 2023-05-30 DIAGNOSIS — S022XXB Fracture of nasal bones, initial encounter for open fracture: Secondary | ICD-10-CM | POA: Diagnosis not present

## 2023-05-30 DIAGNOSIS — W19XXXA Unspecified fall, initial encounter: Secondary | ICD-10-CM | POA: Diagnosis not present

## 2023-05-30 DIAGNOSIS — F0280 Dementia in other diseases classified elsewhere without behavioral disturbance: Secondary | ICD-10-CM | POA: Insufficient documentation

## 2023-05-30 DIAGNOSIS — E440 Moderate protein-calorie malnutrition: Secondary | ICD-10-CM | POA: Insufficient documentation

## 2023-05-30 LAB — CBC
HCT: 35.2 % — ABNORMAL LOW (ref 39.0–52.0)
Hemoglobin: 11.5 g/dL — ABNORMAL LOW (ref 13.0–17.0)
MCH: 32.2 pg (ref 26.0–34.0)
MCHC: 32.7 g/dL (ref 30.0–36.0)
MCV: 98.6 fL (ref 80.0–100.0)
Platelets: 145 10*3/uL — ABNORMAL LOW (ref 150–400)
RBC: 3.57 MIL/uL — ABNORMAL LOW (ref 4.22–5.81)
RDW: 15.2 % (ref 11.5–15.5)
WBC: 5.9 10*3/uL (ref 4.0–10.5)
nRBC: 0 % (ref 0.0–0.2)

## 2023-05-30 LAB — COMPREHENSIVE METABOLIC PANEL
ALT: 38 U/L (ref 0–44)
AST: 34 U/L (ref 15–41)
Albumin: 3 g/dL — ABNORMAL LOW (ref 3.5–5.0)
Alkaline Phosphatase: 117 U/L (ref 38–126)
Anion gap: 7 (ref 5–15)
BUN: 15 mg/dL (ref 8–23)
CO2: 25 mmol/L (ref 22–32)
Calcium: 8.8 mg/dL — ABNORMAL LOW (ref 8.9–10.3)
Chloride: 109 mmol/L (ref 98–111)
Creatinine, Ser: 1.11 mg/dL (ref 0.61–1.24)
GFR, Estimated: >60 mL/min (ref >60)
Glucose, Bld: 92 mg/dL (ref 70–99)
Potassium: 3.6 mmol/L (ref 3.5–5.1)
Sodium: 141 mmol/L (ref 135–145)
Total Bilirubin: 2.6 mg/dL — ABNORMAL HIGH (ref 0.3–1.2)
Total Protein: 5.7 g/dL — ABNORMAL LOW (ref 6.5–8.1)

## 2023-05-30 LAB — MAGNESIUM: Magnesium: 1.8 mg/dL (ref 1.7–2.4)

## 2023-05-30 MED ORDER — HALOPERIDOL LACTATE 5 MG/ML IJ SOLN
2.0000 mg | INTRAMUSCULAR | Status: DC | PRN
  Administered 2023-05-30 – 2023-05-31 (×2): 2 mg via INTRAMUSCULAR
  Filled 2023-05-30 (×2): qty 1

## 2023-05-30 MED ORDER — ENSURE ENLIVE PO LIQD
237.0000 mL | Freq: Two times a day (BID) | ORAL | Status: DC
  Administered 2023-05-30 – 2023-06-01 (×4): 237 mL via ORAL

## 2023-05-30 MED ORDER — HALOPERIDOL LACTATE 5 MG/ML IJ SOLN
2.0000 mg | INTRAMUSCULAR | Status: DC | PRN
  Filled 2023-05-30: qty 1

## 2023-05-30 MED ORDER — HALOPERIDOL LACTATE 5 MG/ML IJ SOLN: 2.0000 mg | Freq: Four times a day (QID) | INTRAMUSCULAR | Status: DC | PRN

## 2023-05-30 NOTE — Care Plan (Signed)
Called [facility] Facility for collateral information patient's Ativan use.  Information received as follows:  - PO Ativan 0.1 mg available PRN for agitation  -Patient received 1 dose on 10/18, and 1 dose on 10/19  -Ativan gel 1 mg available PRN for agitation  -Patient received 1 dose on 10/17, 2 doses on 10/18, and 1 dose on 10/19  Patient does not receive Ativan scheduled.  Low concern for withdrawal at this time.  Cyndia Skeeters, DO [facility], PGY-1 05/30/23 1:11 PM  Service pager [phone]

## 2023-05-30 NOTE — Assessment & Plan Note (Signed)
ENT consulted in ED. Per EDP signout, no antibiotic recommendations to continue after one dose of Unasyn. However, per their note, antibiotics are recommended. Reached back out to ENT who recommended outpatient follow up only without antibiotics. -Outpatient follow up

## 2023-05-30 NOTE — TOC Initial Note (Signed)
Transition of Care [facility]) - Initial/Assessment Note    Patient Details  Name: Steve Gordon MRN: [id number] Date of Birth: [birth year]  Transition of Care [facility]) CM/SW Contact:    Lorri Frederick, LCSW Phone Number: 05/30/2023, 2:45 PM  Clinical Narrative:    Pt oriented x1. CSW spoke with [name] and son [name] by phone.  They were just leaving the hospital.  Pt from [facility] memory care.  They are wanting to have pt go to SNF at [facility], requested medicare choice document be emailed to [place], which was done.  They are also waiting on call from Palliative team.  Referral sent out in hub.               Expected Discharge Plan: Skilled Nursing Facility Barriers to Discharge: Continued Medical Work up, SNF Pending bed offer   Patient Goals and CMS Choice   CMS Medicare.gov Compare Post Acute Care list provided to:: Patient Represenative (must comment) (wife [name]) Choice offered to / list presented to : Spouse      Expected Discharge Plan and Services In-house Referral: Clinical Social Work   Post Acute Care Choice: Skilled Nursing Facility Living arrangements for the past 2 months: Assisted Living Facility [facility] memory care)                                      Prior Living Arrangements/Services Living arrangements for the past 2 months: Assisted Living Facility [facility] memory care) Lives with:: Facility Resident Patient language and need for interpreter reviewed:: No        Need for Family Participation in Patient Care: Yes (Comment) Care giver support system in place?: Yes (comment) Current home services: Other (comment) (na) Criminal Activity/Legal Involvement Pertinent to Current Situation/Hospitalization: No - Comment as needed  Activities of Daily Living      Permission Sought/Granted                  Emotional Assessment Appearance:: Appears stated age Attitude/Demeanor/Rapport: Unable to Assess Affect (typically  observed): Unable to Assess Orientation: : Oriented to Self      Admission diagnosis:  Fall [W19.XXXA] Elevated troponin [R79.89] Closed fracture of nasal bone, initial encounter [S02.2XXA] Fall, initial encounter [id number]] Open fracture of nasal bone, initial encounter [S02.2XXB] Patient Active Problem List   Diagnosis Date Noted   Closed fracture of nasal bone 05/30/2023   Elevated troponin 05/30/2023   Lewy body dementia (HCC) 05/30/2023   Fall 05/29/2023   Shuffling gait 09/01/2022   BPH (benign prostatic hyperplasia) 05/31/2022   Unspecified dementia, moderate, with agitation (HCC) 10/28/2021   Chronic atrial fibrillation (HCC) 01/30/2021   Chronic venous insufficiency 10/15/2019   CKD (chronic kidney disease) stage 3, GFR 30-59 ml/min (HCC) 12/14/2018   Allergic rhinitis 11/29/2017   Weakness    Sinus bradycardia 12/14/2011   Coronary artery disease 11/25/2010   HYPERTENSION, BENIGN 08/26/2010   HYPERCHOLESTEROLEMIA  IIA 03/07/2009   CAD, ARTERY BYPASS GRAFT 03/07/2009   PCP:  Caro Laroche, DO Pharmacy:   [facility] [address], [place] - [address] [place] [address] Phone: [phone] Fax: [phone]     Social Determinants of Health (SDOH) Social History: SDOH Screenings   Food Insecurity: No Food Insecurity (12/13/2022)  Housing: Low Risk  (12/13/2022)  Transportation Needs: No Transportation Needs (12/13/2022)  Utilities: Not At Risk (  12/13/2022)  Alcohol Screen: Low Risk  (12/13/2022)  Depression (PHQ2-9): Medium Risk (03/02/2023)  Financial Resource Strain: Low Risk  (12/13/2022)  Physical Activity: Insufficiently Active (12/13/2022)  Social Connections: Moderately Isolated (12/13/2022)  Stress: No Stress Concern Present (12/13/2022)  Tobacco Use: Low Risk  (05/29/2023)   SDOH Interventions:     Readmission Risk Interventions     No data to display

## 2023-05-30 NOTE — Evaluation (Signed)
Occupational Therapy Evaluation Patient Details Name: Steve Gordon MRN: [id number] DOB: March 28, 1936 Today's Date: 05/30/2023   History of Present Illness The pt is an 87 yo male presenting from [facility] on 10/20 after an unwitnessed fall. Pt with multiple recent ED admissions for head trauma (10/6 and 10/18) without acute injury noted on CT. PMH includes: HTN, CAD, chronic afib, dementia, CKD3, parkinson's.   Clinical Impression   Patient admitted for above and presents with problem list below.  Oriented to self (name) only, follows simple commands with increased time and mulitmodal cueing but demonstrates poor initiation, safety awareness. He requires up to max assist for bed mobility, mod assist +2 for transfers and limited functional mobility to bathroom, and min to total assist for Adls.  Pt from memory care, unable to provide PLOF but anticipate he was needing assist for ADLs due to cognition.  Will follow acutely to optimize safety, tolerance with mobility and ADLs but anticipate return to Memory care when medically cleared.       If plan is discharge home, recommend the following: Two people to help with walking and/or transfers;A lot of help with bathing/dressing/bathroom;Supervision due to cognitive status    Functional Status Assessment  Patient has had a recent decline in their functional status and demonstrates the ability to make significant improvements in function in a reasonable and predictable amount of time.  Equipment Recommendations  Other (comment) (defer)    Recommendations for Other Services       Precautions / Restrictions Precautions Precautions: Fall Restrictions Weight Bearing Restrictions: No      Mobility Bed Mobility Overal bed mobility: Needs Assistance Bed Mobility: Supine to Sit, Sit to Supine     Supine to sit: Max assist, +2 for physical assistance Sit to supine: Min assist, +2 for safety/equipment   General bed mobility  comments: pt requires total assist to initate movement to EOB, once partially upright able to scoot towards EOB with a littel support; returned back to supine with min assist    Transfers Overall transfer level: Needs assistance Equipment used: 2 person hand held assist Transfers: Sit to/from Stand Sit to Stand: Mod assist, +2 physical assistance           General transfer comment: to power up and steady from EOB with mod assist +2, posterior lean preference      Balance Overall balance assessment: Needs assistance Sitting-balance support: No upper extremity supported, Feet supported Sitting balance-Leahy Scale: Fair Sitting balance - Comments: statically min guard, dynamically NT   Standing balance support: Bilateral upper extremity supported, Single extremity supported, During functional activity Standing balance-Leahy Scale: Poor Standing balance comment: relies on BUE support dynamically, brief moments of 0 hand support but min-mod assist during hand washing                           ADL either performed or assessed with clinical judgement   ADL Overall ADL's : Needs assistance/impaired     Grooming: Moderate assistance;Standing;Wash/dry hands           Upper Body Dressing : Maximal assistance;Sitting   Lower Body Dressing: Total assistance;+2 for physical assistance   Toilet Transfer: Moderate assistance;+2 for physical assistance;Ambulation           Functional mobility during ADLs: Moderate assistance;Maximal assistance;+2 for physical assistance;Rolling walker (2 wheels);Cueing for sequencing       Vision   Additional Comments: not formally tested  Perception         Praxis         Pertinent Vitals/Pain Pain Assessment Pain Assessment: Faces Faces Pain Scale: No hurt Pain Intervention(s): Monitored during session     Extremity/Trunk Assessment Upper Extremity Assessment Upper Extremity Assessment: Generalized weakness    Lower Extremity Assessment Lower Extremity Assessment: Defer to PT evaluation       Communication Communication Communication: Hearing impairment Cueing Techniques: Verbal cues;Gestural cues;Tactile cues;Visual cues   Cognition Arousal: Alert Behavior During Therapy: Flat affect Overall Cognitive Status: History of cognitive impairments - at baseline                                 General Comments: pt oriented to self only. following simple commands with increased time and mulitmoda cueing. poor initation.     General Comments  skin tears on L hand/wrist, covered with mepilex and notified RN; HR up to 135 with in room activity    Exercises     Shoulder Instructions      Home Living Family/patient expects to be discharged to:: Other (Comment)                                 Additional Comments: Memory Care      Prior Functioning/Environment Prior Level of Function : History of Falls (last six months);Needs assist;Patient poor historian/Family not available             Mobility Comments: hx of falls, unreliable historian unsure if using DME ADLs Comments: hx of falls, unreliable historian. anticpate needing assist for ADLs.        OT Problem List: Decreased strength;Impaired balance (sitting and/or standing);Decreased activity tolerance;Decreased coordination;Decreased cognition;Decreased safety awareness;Decreased knowledge of use of DME or AE;Decreased knowledge of precautions      OT Treatment/Interventions: Self-care/ADL training;DME and/or AE instruction;Therapeutic activities;Patient/family education;Balance training    OT Goals(Current goals can be found in the care plan section) Acute Rehab OT Goals Patient Stated Goal: none stated OT Goal Formulation: Patient unable to participate in goal setting Time For Goal Achievement: 06/13/23 Potential to Achieve Goals: Fair  OT Frequency: Min 1X/week    Co-evaluation PT/OT/SLP  Co-Evaluation/Treatment: Yes Reason for Co-Treatment: For patient/therapist safety;To address functional/ADL transfers;Necessary to address cognition/behavior during functional activity   OT goals addressed during session: ADL's and self-care      AM-PAC OT "6 Clicks" Daily Activity     Outcome Measure Help from another person eating meals?: A Little Help from another person taking care of personal grooming?: A Lot Help from another person toileting, which includes using toliet, bedpan, or urinal?: A Lot Help from another person bathing (including washing, rinsing, drying)?: A Lot Help from another person to put on and taking off regular upper body clothing?: A Lot Help from another person to put on and taking off regular lower body clothing?: Total 6 Click Score: 12   End of Session Equipment Utilized During Treatment: Gait belt Nurse Communication: Mobility status;Other (comment);Precautions (skin tears on L hand/wrist)  Activity Tolerance: Patient tolerated treatment well Patient left: in bed;with call bell/phone within reach;with bed alarm set;with restraints reapplied  OT Visit Diagnosis: Other abnormalities of gait and mobility (R26.89);History of falling (Z91.81);Other symptoms and signs involving cognitive function                Time: 1324-4010 OT Time Calculation (  min): 21 min Charges:  OT General Charges $OT Visit: 1 Visit OT Evaluation $OT Eval Moderate Complexity: 1 Mod  Barry Brunner, OT Acute Rehabilitation Services Office [phone]   Chancy Milroy 05/30/2023, 10:39 AM
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
## 2023-05-30 NOTE — Assessment & Plan Note (Signed)
Likely an element of demand ischemia given lack of chest pain, SOB as well as stable EKG. -Trend troponin

## 2023-05-30 NOTE — Assessment & Plan Note (Signed)
Suspected possible Parkinson's versus Lewy body dementia.  Neurology curb sided and recommended no acute intervention at this time.  Recommend patient to follow-up outpatient.

## 2023-05-30 NOTE — Progress Notes (Signed)
Daily Progress Note Intern Pager: [phone]  Patient name: Steve Gordon Medical record number: [id number] Date of birth: 1936-04-07 Age: 87 y.o. Gender: male  Primary Care Provider: Caro Laroche, DO Consultants: None Code Status: DNR/DNI  Pt Overview and Major Events to Date:  10/20: Admitted to FMTS  Assessment and Plan: Steve Gordon is an 87 year old male presenting with unwitnessed fall and nasal fracture.  Patient does have dementia and is unable to answer questions or provide any clarity.  He answers "yes" to every question I ask him.  He does not appear to be in pain at this time.  There is some concern for Ativan withdrawal as it is unclear how often patient is taking Ativan facility.  Attempting to contact [facility] memory care facility at this time for clarification.  Patient awaiting PT/OT eval to help determine disposition.  He is medically stable at this time.  Pertinent PMH/PSH includes HLD/CAD, HTN, A-fib, dementia, CKD .  Assessment & Plan Fall As part of initial workup: Troponins downtrending; CK, magnesium, ammonia all WNL; UDS negative. Patient appears stable, without pain at this time.  Suspect this fall is multifactorial in setting of dementia, deconditioning, prescription medications. -PT/OT eval -Nutrition consult, appreciate recs -Continue to hold Depakote, lorazepam.   -Continue scheduled seroquel 50 mg BID -Delirium precautions -AM CBC Closed fracture of nasal bone Patient received 1 dose of Unasyn, no further doses of antibiotics recommended per ENT.  Will follow-up with ENT outpatient. -ENT outpatient follow up Elevated troponin Likely an element of demand ischemia given lack of chest pain, SOB as well as stable EKG. Troponins ultimately trended flat. Lewy body dementia (HCC) Suspected possible Parkinson's versus Lewy body dementia.  Neurology curb sided and recommended no acute intervention at this time.  Recommend patient to  follow-up outpatient.   Chronic and Stable Problems:  HLD-continue home atorvastatin 40 mg HTN-BP acceptable, continue home metoprolol 25 mg Chronic Afib-Not in RVR on EKG and has stable HR, continue home metoprolol 25 mg CKD 3a- monitor BMPs, appears improved from prior Cr check Dementia-Seroquel as needed, stopping scheduled seroquel and prn as above   FEN/GI: Heart healthy diet PPx: SCDs Dispo:SNF tomorrow. Barriers include clinical improvement.   Subjective:  Patient seen this morning sitting up in bed.  He is alert and comfortable appearing, though he is oriented only to self.  He answers "yes" to every question I ask him.  Does not appear to be in distress or pain.  Objective: Temp:  [97.7 F (36.5 C)-98.1 F (36.7 C)] 98 F (36.7 C) (10/21 0753) Pulse Rate:  [73-109] 74 (10/21 0753) Resp:  [13-18] 13 (10/21 0606) BP: (145-158)/(82-97) 145/82 (10/21 0753) SpO2:  [97 %-100 %] 100 % (10/21 0753) Physical Exam: General: Elderly male, sitting up in bed, multiple contusions to face and head.  No acute distress. Cardiovascular: RRR, no murmurs Respiratory: CTA bilaterally, no increased work of breathing on room air Abdomen: Soft, non distended Extremities: Moves all extremities equally.  Skin warm and dry.  Laboratory: Most recent CBC Lab Results  Component Value Date   WBC 5.9 05/30/2023   HGB 11.5 (L) 05/30/2023   HCT 35.2 (L) 05/30/2023   MCV 98.6 05/30/2023   PLT 145 (L) 05/30/2023   Most recent BMP    Latest Ref Rng & Units 05/30/2023    8:21 AM  BMP  Glucose 70 - 99 mg/dL 92   BUN 8 - 23 mg/dL 15   Creatinine 6.60 - 1.24  mg/dL 1.61   Sodium 096 - 045 mmol/L 141   Potassium 3.5 - 5.1 mmol/L 3.6   Chloride 98 - 111 mmol/L 109   CO2 22 - 32 mmol/L 25   Calcium 8.9 - 10.3 mg/dL 8.8     Steve Skeeters, DO 05/30/2023, 12:59 PM  PGY-1, [facility] FPTS Intern pager: [phone], text pages welcome Secure chat group [facility] Teaching Service

## 2023-05-30 NOTE — Progress Notes (Signed)
FMTS Brief Progress Note  S: Paged by nursing staff for patient repeatedly combative and agitated, hitting and grabbing staff, cursing.  Evaluated at bedside.  Did see patient straining against staff who were trying to hold his hands while they placed Band-Aids over his scalp wounds which were bleeding.  After staff let go of his hands he was redirectable.  He asked for a hat and shook my hand, stated he would like to rest.   O: BP (!) 145/82 (BP Location: Right Arm)   Pulse 74   Temp 98 F (36.7 C) (Oral)   Resp 13   Ht 5\' 8"  (1.727 m)   Wt 75 kg   SpO2 100%   BMI 25.14 kg/m   General: initially straining against staff, kicking, trying to get out of bed; then calms and rests comfortably without restraint.  A/P: Patient was agitated and combative with staff. Patient currently calm. Does not have mittens on. - Will attempt to replace mittens - Consider haldol if patient continues to be combative and cannot be redirected - Nursing staff to alert our team if agitation reoccurs so we can revaluate.   Cyndia Skeeters, DO 05/30/2023, 5:19 PM PGY-1, [facility] Night Resident  Please page [phone] with questions.

## 2023-05-30 NOTE — Plan of Care (Signed)
CHL Tonsillectomy/Adenoidectomy, Postoperative PEDS care plan entered in error.

## 2023-05-30 NOTE — Progress Notes (Signed)
Initial Nutrition Assessment  DOCUMENTATION CODES:   Non-severe (moderate) malnutrition in context of chronic illness  INTERVENTION:  -liberalize diet to regular with 1:1 feeding provision all meals.  -supplement meals with Ensure Enlive po BID, each supplement provides 350 kcal and 20 grams of protein, Magic cup daily with meal, each supplement provides 290 kcal and 9 grams of protein.    NUTRITION DIAGNOSIS:   Moderate Malnutrition related to chronic illness as evidenced by mild fat depletion, moderate fat depletion, moderate muscle depletion, mild muscle depletion.   GOAL:   Patient will meet greater than or equal to 90% of their needs    MONITOR:   PO intake, Labs, Supplement acceptance, Weight trends, Skin  REASON FOR ASSESSMENT:   Consult Assessment of nutrition requirement/status  ASSESSMENT: 87 y/o male presented with unwitnessed fall and nasal fracture.  CT showed bilateral nasal bone fractures.  Pertinent PMH: HLD/CAD, HTN, chronic A-fib, dementia, CKD3, coronary angioplasty with stent x2, laparoscopic hernia repair, cholecystectomy, hx tobacco use  Palliative care consulted.   Met with patient at bedside, he was wearing mittens as earlier he was agitated and combative, swatting at staff. During visit, he was focused on trying to remove mitts and cursing. Patient is unable to provide a nutrition hx, he is alert only to self. Per staff, he consumed 100% of B, requires total feeding assist.  Per review of oral cavity, appears to have adequate dentition. Per review of weight hx, no significant weight change identified past four years.   Medications reviewed.  Labs reviewed.    NUTRITION - FOCUSED PHYSICAL EXAM:  Flowsheet Row Most Recent Value  Orbital Region No depletion  Upper Arm Region Mild depletion  Thoracic and Lumbar Region No depletion  Buccal Region Moderate depletion  Temple Region Moderate depletion  Clavicle Bone Region Moderate depletion   Clavicle and Acromion Bone Region Mild depletion  Scapular Bone Region Mild depletion  Dorsal Hand Unable to assess  Patellar Region Mild depletion  Anterior Thigh Region Mild depletion  Posterior Calf Region Mild depletion  Edema (RD Assessment) None  Hair Other (Comment)  [no hair]  Eyes Reviewed  Mouth Reviewed  Skin Reviewed  Nails Unable to assess  [wearing mittens]       Diet Order:   Diet Order             Diet Heart Room service appropriate? Yes; Fluid consistency: Thin  Diet effective now                   EDUCATION NEEDS:   Not appropriate for education at this time  Skin:  Skin Assessment: Skin Integrity Issues: Skin Integrity Issues:: Other (Comment) Other: wound laceration to mid upper head, staples  Last BM:  PTA  Height:   Ht Readings from Last 1 Encounters:  05/29/23 5\' 8"  (1.727 m)    Weight:   Wt Readings from Last 1 Encounters:  05/29/23 75 kg    Ideal Body Weight:     BMI:  Body mass index is 25.14 kg/m.  Estimated Nutritional Needs:   Kcal:  1875-2100  Protein:  90-98  Fluid:  1875-2100    Alvino Chapel, RDLD Clinical Dietitian See AMION for contact information

## 2023-05-30 NOTE — Care Plan (Addendum)
Spoke with patient's wife, [name], and Mammie Lorenzo this afternoon.  They shared their experience of Mr. Gendreau recent decline and how this has affected them and their family members.    He recently began to wander from his home because he did not think he lived there any longer, and believed he instead lived in [place].  Onalee Hua has had to go find him walking down the road several times.  The family made the decision to move him into assisted living in a memory care unit but were not able to go see him very often because it would distress Mr. Blankley greatly when they would leave without him.  Onalee Hua notes that Mr. Francesco Runner has lost a significant amount of weight as compared to the last time he saw him about 6 weeks ago.  They also share frustration over the events that have brought Mr. Gow into the hospital recently.    We discussed Mr. Hetzer's dementia and his recent changes in function.  I introduced the idea of palliative care as a way to make sure that Mr. Scism is comfortable.  I answered the family's questions about the nature of palliative care versus hospice.  The family does question if the patient will be eligible for hospice.  I will consult palliative care at the family's wishes and provide contact information for Chisholm Conary (also in chart) to be the main communicator with medical team members.  The family also expressed their hopes that Mr. Gorby can continue to be redirected when he becomes agitated.  They share that he is easier to redirect when he is told that his family members (particularly Ned Card, and daughter Junie Panning) are on their way to see him.  Provided support and answered all questions to the best of my ability.  Cyndia Skeeters, DO [facility], PGY-1 05/30/23 2:41 PM  Service pager [phone]

## 2023-05-30 NOTE — Plan of Care (Signed)
Pt agitated earlier in the shift, sch medication given. Throughout the night patient was resting with intermittent waking up and trying to pull the mitts and IV off. Pt was redirected and was able to fall back asleep.   Pt remains A&O to self only.   Pills given with apple sauce. Noted that patient was chewing the medication in the mouth. Will give future meds crushed in apple sauce. No problem noted drinking full cup of water with straw.   Problem: Education: Goal: Knowledge of General Education information will improve Description: Including pain rating scale, medication(s)/side effects and non-pharmacologic comfort measures Outcome: Progressing   Problem: Health Behavior/Discharge Planning: Goal: Ability to manage health-related needs will improve Outcome: Progressing   Problem: Clinical Measurements: Goal: Will remain free from infection Outcome: Progressing   Problem: Safety: Goal: Ability to remain free from injury will improve Outcome: Progressing   Problem: Skin Integrity: Goal: Risk for impaired skin integrity will decrease Outcome: Progressing

## 2023-05-30 NOTE — Evaluation (Addendum)
Physical Therapy Evaluation Patient Details Name: Steve Gordon MRN: [id number] DOB: October 09, 1935 Today's Date: 05/30/2023  History of Present Illness  The pt is an 87 yo male presenting from [facility] on 10/20 after an unwitnessed fall. Pt with nasal bone fx, no intracranial injury. Pt with multiple recent ED admissions for head trauma (10/6 and 10/18) without acute injury noted on CT. PMH includes: HTN, CAD, chronic afib, dementia, CKD3, parkinson's.   Clinical Impression  Pt in bed upon arrival of PT, agreeable to evaluation at this time. No family present and pt not able to consistently answer questions about PLOF, pt responding "yes" to all questions this morning. He currently required modA of 2 to complete bed mobility, sit-stand transfers, and short-distance ambulation to the bathroom due to poor dynamic stability, power, and confusion. He needed max multimodal cues to complete tasks, no family present to confirm cognitive baseline. Will continue to benefit from skilled PT acutely to progress functional power, stability, and gait to reduce risk of continued falls.          If plan is discharge home, recommend the following: Two people to help with walking and/or transfers;A lot of help with bathing/dressing/bathroom;Assistance with cooking/housework;Direct supervision/assist for medications management;Direct supervision/assist for financial management;Assist for transportation;Help with stairs or ramp for entrance;Supervision due to cognitive status   Can travel by private vehicle   No    Equipment Recommendations None recommended by PT  Recommendations for Other Services       Functional Status Assessment Patient has had a recent decline in their functional status and demonstrates the ability to make significant improvements in function in a reasonable and predictable amount of time.     Precautions / Restrictions Precautions Precautions: Fall Precaution Comments: pt  in bilateral mitts Restrictions Weight Bearing Restrictions: No      Mobility  Bed Mobility Overal bed mobility: Needs Assistance Bed Mobility: Supine to Sit, Sit to Supine     Supine to sit: Max assist, +2 for physical assistance Sit to supine: Min assist, +2 for safety/equipment   General bed mobility comments: pt requires total assist to initate movement to EOB, once partially upright able to scoot towards EOB with a littel support; returned back to supine with min assist    Transfers Overall transfer level: Needs assistance Equipment used: 2 person hand held assist Transfers: Sit to/from Stand Sit to Stand: Mod assist, +2 physical assistance           General transfer comment: to power up and steady from EOB with mod assist +2, posterior lean preference    Ambulation/Gait Ambulation/Gait assistance: Mod assist, +2 physical assistance Gait Distance (Feet): 15 Feet (+ 10 ft) Assistive device: 2 person hand held assist Gait Pattern/deviations: Step-to pattern, Decreased dorsiflexion - right, Decreased dorsiflexion - left, Decreased stride length, Shuffle, Festinating Gait velocity: decreased     General Gait Details: pt with short, shuffling steps, at times with difficulty initiating movement. modA to steady (posterior LOB) and to direct     Balance Overall balance assessment: Needs assistance Sitting-balance support: No upper extremity supported, Feet supported Sitting balance-Leahy Scale: Fair Sitting balance - Comments: statically min guard, dynamically NT   Standing balance support: Bilateral upper extremity supported, Single extremity supported, During functional activity Standing balance-Leahy Scale: Poor Standing balance comment: relies on BUE support dynamically, brief moments of 0 hand support but min-mod assist during hand washing  Pertinent Vitals/Pain Pain Assessment Pain Assessment: Faces Faces Pain Scale: No  hurt Pain Intervention(s): Monitored during session    Home Living Family/patient expects to be discharged to:: Other (Comment)                   Additional Comments: Memory Care    Prior Function Prior Level of Function : History of Falls (last six months);Needs assist;Patient poor historian/Family not available             Mobility Comments: hx of falls, unreliable historian unsure if using DME ADLs Comments: hx of falls, unreliable historian. anticpate needing assist for ADLs.     Extremity/Trunk Assessment   Upper Extremity Assessment Upper Extremity Assessment: Generalized weakness;Defer to OT evaluation    Lower Extremity Assessment Lower Extremity Assessment: Generalized weakness (pt able to use functionally, full assessment limited by cognition)       Communication   Communication Communication: Hearing impairment Cueing Techniques: Verbal cues;Gestural cues;Tactile cues;Visual cues  Cognition Arousal: Alert Behavior During Therapy: Flat affect Overall Cognitive Status: History of cognitive impairments - at baseline                                 General Comments: pt oriented to self only. following simple commands with increased time and mulitmoda cueing. poor initation.        General Comments General comments (skin integrity, edema, etc.): skin tears on L hand/wrist, covered with mepilex and notified RN; HR up to 135 with in room activity    Exercises     Assessment/Plan    PT Assessment Patient needs continued PT services  PT Problem List Decreased strength;Decreased activity tolerance;Decreased balance;Decreased mobility;Decreased cognition;Decreased coordination;Decreased safety awareness       PT Treatment Interventions DME instruction;Gait training;Functional mobility training;Therapeutic activities;Therapeutic exercise;Balance training;Neuromuscular re-education    PT Goals (Current goals can be found in the Care Plan  section)  Acute Rehab PT Goals Patient Stated Goal: to go to the bathroom PT Goal Formulation: With patient Time For Goal Achievement: 06/13/23 Potential to Achieve Goals: Good    Frequency Min 1X/week     Co-evaluation PT/OT/SLP Co-Evaluation/Treatment: Yes Reason for Co-Treatment: For patient/therapist safety;To address functional/ADL transfers;Necessary to address cognition/behavior during functional activity PT goals addressed during session: Mobility/safety with mobility;Balance;Proper use of DME;Strengthening/ROM OT goals addressed during session: ADL's and self-care       AM-PAC PT "6 Clicks" Mobility  Outcome Measure Help needed turning from your back to your side while in a flat bed without using bedrails?: A Lot Help needed moving from lying on your back to sitting on the side of a flat bed without using bedrails?: A Lot Help needed moving to and from a bed to a chair (including a wheelchair)?: A Lot Help needed standing up from a chair using your arms (e.g., wheelchair or bedside chair)?: A Lot Help needed to walk in hospital room?: Total Help needed climbing 3-5 steps with a railing? : Total 6 Click Score: 10    End of Session Equipment Utilized During Treatment: Gait belt Activity Tolerance: Patient tolerated treatment well Patient left: in bed;with call bell/phone within reach;with bed alarm set;with restraints reapplied (bilateral mitts) Nurse Communication: Mobility status PT Visit Diagnosis: Unsteadiness on feet (R26.81);Repeated falls (R29.6);Muscle weakness (generalized) (M62.81)    Time: 1610-9604 PT Time Calculation (min) (ACUTE ONLY): 21 min   Charges:   PT Evaluation $PT Eval Moderate Complexity: 1 Mod  PT General Charges $$ ACUTE PT VISIT: 1 Visit         Vickki Muff, PT, DPT   Acute Rehabilitation Department Office [phone] Secure Chat Communication Preferred  Ronnie Derby 05/30/2023, 1:41 PM

## 2023-05-30 NOTE — Progress Notes (Signed)
FMTS Interim Progress Note  S: Paged by nursing staff for agitated and combative patient, swatting at nursing staff. Evaluated at bedside with Dr. Mliss Sax.  O: BP (!) 145/82 (BP Location: Right Arm)   Pulse 74   Temp 98 F (36.7 C) (Oral)   Resp 13   Ht 5\' 8"  (1.727 m)   Wt 75 kg   SpO2 100%   BMI 25.14 kg/m    General: Sleeping in bed. Chronically ill-appearing.  A/P: Agitation Combative with nursing staff. Patient currently asleep. Mittens in place. -Consider soft restraints v. Haloperidol if reoccurs -Verify Benzodiazepine use with care facility -Recommend palliative care consult, will discuss with family  Tiffany Kocher, DO 05/30/2023, 11:50 AM PGY-2, [facility] Family [facility] Service pager [phone]

## 2023-05-30 NOTE — Assessment & Plan Note (Signed)
Patient received 1 dose of Unasyn, no further doses of antibiotics recommended per ENT.  Will follow-up with ENT outpatient. -ENT outpatient follow up

## 2023-05-30 NOTE — Plan of Care (Signed)
  Problem: Clinical Measurements: Goal: Ability to maintain clinical measurements within normal limits will improve 05/30/2023 1626 by Earlie Raveling, RN Outcome: Progressing 05/30/2023 1625 by Earlie Raveling, RN Outcome: Not Progressing Goal: Will remain free from infection 05/30/2023 1626 by Earlie Raveling, RN Outcome: Progressing 05/30/2023 1625 by Earlie Raveling, RN Outcome: Not Progressing   Problem: Safety: Goal: Ability to remain free from injury will improve 05/30/2023 1626 by Earlie Raveling, RN Outcome: Progressing 05/30/2023 1625 by Earlie Raveling, RN Outcome: Not Progressing   Problem: Education: Goal: Knowledge of General Education information will improve Description: Including pain rating scale, medication(s)/side effects and non-pharmacologic comfort measures Outcome: Not Progressing   Problem: Health Behavior/Discharge Planning: Goal: Ability to manage health-related needs will improve Outcome: Not Progressing   Problem: Clinical Measurements: Goal: Diagnostic test results will improve Outcome: Not Progressing Goal: Respiratory complications will improve Outcome: Not Progressing Goal: Cardiovascular complication will be avoided Outcome: Not Progressing   Problem: Activity: Goal: Risk for activity intolerance will decrease Outcome: Not Progressing   Problem: Nutrition: Goal: Adequate nutrition will be maintained Outcome: Not Progressing   Problem: Coping: Goal: Level of anxiety will decrease Outcome: Not Progressing   Problem: Elimination: Goal: Will not experience complications related to bowel motility Outcome: Not Progressing Goal: Will not experience complications related to urinary retention Outcome: Not Progressing   Problem: Pain Managment: Goal: General experience of comfort will improve Outcome: Not Progressing   Problem: Skin Integrity: Goal: Risk for impaired skin integrity will decrease Outcome: Not Progressing

## 2023-05-30 NOTE — Progress Notes (Signed)
FMTS Interim Progress Note  S:  MD paged @[phone]  with concern for continued aggressive behavior towards nursing staff, pulling out lines, unable to redirect even after evening Seroquel 50 mg.  Per nursing, patient had been kicking staff in the head and abdomen, using racial slurs, making continued physical threats. On arrival, patient was attempting to remove mittens and asked if MD "wanted some more", saying "I'll hurt you."  Verbal orders given for bilateral soft wrist restraints at that time, placed without further incident. Ordered as needed Haldol 2 mg every 15 minutes up to a total of 10 mg for continued agitation. The family medicine team appreciates RN Herma Mering Winston's care for this patient.  O: BP 131/72 (BP Location: Right Arm)   Pulse 87   Temp 98 F (36.7 C) (Oral)   Resp 14   Ht 5\' 8"  (1.727 m)   Wt 75 kg   SpO2 100%   BMI 25.14 kg/m     A/P:  Agitation - Violent restraints ordered -- bilateral soft wrist. - Ordered as needed Haldol 2 mg q. 15 minutes to a total of 10 mg for continued agitation. - We will continue to monitor and make adjustments as needed.  Tomie China, MD 05/30/2023, 11:40 PM PGY-1, [facility] Family Medicine Service pager [phone]

## 2023-05-30 NOTE — Assessment & Plan Note (Addendum)
As part of initial workup: Troponins downtrending; CK, magnesium, ammonia all WNL; UDS negative. Patient appears stable, without pain at this time.  Suspect this fall is multifactorial in setting of dementia, deconditioning, prescription medications. -PT/OT eval -Nutrition consult, appreciate recs -Continue to hold Depakote, lorazepam.   -Continue scheduled seroquel 50 mg BID -Delirium precautions -AM CBC

## 2023-05-30 NOTE — Assessment & Plan Note (Signed)
Likely an element of demand ischemia given lack of chest pain, SOB as well as stable EKG. Troponins ultimately trended flat.

## 2023-05-30 NOTE — [place] FL2 (Signed)
[place] MEDICAID FL2 LEVEL OF CARE FORM     IDENTIFICATION  Patient Name: BRETTON MAYNE Birthdate: July 24, 1936 Sex: male Admission Date (Current Location): 05/29/2023  [facility] and [place] Number:  Producer, television/film/video and Address:  The [place], [address], [place], [place] [address]      Provider Number: [id number]  Attending Physician Name and Address:  Latrelle Dodrill, MD  Relative Name and Phone Number:  Dejohn, Hopson [phone]  [phone]    Current Level of Care: Hospital Recommended Level of Care: Skilled Nursing Facility Prior Approval Number:    Date Approved/Denied:   PASRR Number: [id number]  Discharge Plan: SNF    Current Diagnoses: Patient Active Problem List   Diagnosis Date Noted   Closed fracture of nasal bone 05/30/2023   Elevated troponin 05/30/2023   Lewy body dementia (HCC) 05/30/2023   Fall 05/29/2023   Shuffling gait 09/01/2022   BPH (benign prostatic hyperplasia) 05/31/2022   Unspecified dementia, moderate, with agitation (HCC) 10/28/2021   Chronic atrial fibrillation (HCC) 01/30/2021   Chronic venous insufficiency 10/15/2019   CKD (chronic kidney disease) stage 3, GFR 30-59 ml/min (HCC) 12/14/2018   Allergic rhinitis 11/29/2017   Weakness    Sinus bradycardia 12/14/2011   Coronary artery disease 11/25/2010   HYPERTENSION, BENIGN 08/26/2010   HYPERCHOLESTEROLEMIA  IIA 03/07/2009   CAD, ARTERY BYPASS GRAFT 03/07/2009    Orientation RESPIRATION BLADDER Height & Weight     Self  Normal Incontinent, External catheter Weight: 165 lb 5.5 oz (75 kg) Height:  5\' 8"  (172.7 cm)  BEHAVIORAL SYMPTOMS/MOOD NEUROLOGICAL BOWEL NUTRITION STATUS      Incontinent Diet (see discharge summary)  AMBULATORY STATUS COMMUNICATION OF NEEDS Skin   Limited Assist Verbally Skin abrasions, Other (Comment) (redness, staples on head from prior falls)                       Personal Care Assistance Level of  Assistance  Bathing, Feeding, Dressing, Total care Bathing Assistance: Maximum assistance Feeding assistance: Limited assistance Dressing Assistance: Maximum assistance Total Care Assistance: Maximum assistance   Functional Limitations Info  Sight, Hearing, Speech Sight Info: Adequate Hearing Info: Impaired Speech Info: Adequate    SPECIAL CARE FACTORS FREQUENCY  PT (By licensed PT), OT (By licensed OT)     PT Frequency: 5x week OT Frequency: 5x week            Contractures Contractures Info: Not present    Additional Factors Info  Code Status, Allergies Code Status Info: DNR Allergies Info: NKA           Current Medications (05/30/2023):  This is the current hospital active medication list Current Facility-Administered Medications  Medication Dose Route Frequency Provider Last Rate Last Admin   acetaminophen (TYLENOL) tablet 650 mg  650 mg Oral Q6H PRN Evette Georges, MD       atorvastatin (LIPITOR) tablet 40 mg  40 mg Oral Daily Evette Georges, MD   40 mg at 05/30/23 1102   divalproex (DEPAKOTE SPRINKLE) capsule 250 mg  250 mg Oral TID Latrelle Dodrill, MD   250 mg at 05/30/23 1102   feeding supplement (ENSURE ENLIVE / ENSURE PLUS) liquid 237 mL  237 mL Oral BID BM Latrelle Dodrill, MD       metoprolol tartrate (LOPRESSOR) tablet 12.5 mg  12.5 mg Oral BID Evette Georges, MD   12.5 mg at 05/30/23 1102   oxymetazoline (AFRIN) 0.05 % nasal  spray 1 spray  1 spray Each Nare Once Evette Georges, MD       QUEtiapine (SEROQUEL) tablet 50 mg  50 mg Oral BID Evette Georges, MD   50 mg at 05/30/23 1102     Discharge Medications: Please see discharge summary for a list of discharge medications.  Relevant Imaging Results:  Relevant Lab Results:   Additional Information SSN: [id number]  Lorri Frederick, LCSW

## 2023-05-31 DIAGNOSIS — S022XXA Fracture of nasal bones, initial encounter for closed fracture: Secondary | ICD-10-CM | POA: Diagnosis not present

## 2023-05-31 DIAGNOSIS — S022XXB Fracture of nasal bones, initial encounter for open fracture: Secondary | ICD-10-CM | POA: Diagnosis not present

## 2023-05-31 DIAGNOSIS — Z7189 Other specified counseling: Secondary | ICD-10-CM | POA: Diagnosis not present

## 2023-05-31 DIAGNOSIS — Z515 Encounter for palliative care: Secondary | ICD-10-CM | POA: Diagnosis not present

## 2023-05-31 DIAGNOSIS — R7989 Other specified abnormal findings of blood chemistry: Secondary | ICD-10-CM | POA: Diagnosis not present

## 2023-05-31 DIAGNOSIS — W19XXXA Unspecified fall, initial encounter: Secondary | ICD-10-CM | POA: Diagnosis not present

## 2023-05-31 LAB — CBC
HCT: 37 % — ABNORMAL LOW (ref 39.0–52.0)
Hemoglobin: 12.1 g/dL — ABNORMAL LOW (ref 13.0–17.0)
MCH: 32.4 pg (ref 26.0–34.0)
MCHC: 32.7 g/dL (ref 30.0–36.0)
MCV: 99.2 fL (ref 80.0–100.0)
Platelets: 163 10*3/uL (ref 150–400)
RBC: 3.73 MIL/uL — ABNORMAL LOW (ref 4.22–5.81)
RDW: 15.4 % (ref 11.5–15.5)
WBC: 6.9 10*3/uL (ref 4.0–10.5)
nRBC: 0 % (ref 0.0–0.2)

## 2023-05-31 MED ORDER — GLYCOPYRROLATE 0.2 MG/ML IJ SOLN: 0.2000 mg | INTRAMUSCULAR | Status: DC | PRN

## 2023-05-31 MED ORDER — MORPHINE SULFATE (PF) 2 MG/ML IV SOLN: 1.0000 mg | INTRAVENOUS | Status: DC | PRN

## 2023-05-31 MED ORDER — ONDANSETRON 4 MG PO TBDP: 4.0000 mg | ORAL_TABLET | Freq: Four times a day (QID) | ORAL | Status: DC | PRN

## 2023-05-31 MED ORDER — LORAZEPAM 2 MG/ML IJ SOLN
0.5000 mg | Freq: Four times a day (QID) | INTRAMUSCULAR | Status: DC
Start: 2023-05-31 — End: 2023-06-01
  Administered 2023-05-31 – 2023-06-01 (×3): 0.5 mg via INTRAVENOUS
  Filled 2023-05-31 (×3): qty 1

## 2023-05-31 MED ORDER — LORAZEPAM 2 MG/ML IJ SOLN: 0.5000 mg | INTRAMUSCULAR | Status: DC | PRN

## 2023-05-31 MED ORDER — POLYVINYL ALCOHOL 1.4 % OP SOLN: 1.0000 [drp] | Freq: Four times a day (QID) | OPHTHALMIC | Status: DC | PRN

## 2023-05-31 MED ORDER — BIOTENE DRY MOUTH MT LIQD: 15.0000 mL | OROMUCOSAL | Status: DC | PRN

## 2023-05-31 MED ORDER — ONDANSETRON HCL 4 MG/2ML IJ SOLN: 4.0000 mg | Freq: Four times a day (QID) | INTRAMUSCULAR | Status: DC | PRN

## 2023-05-31 MED ORDER — GLYCOPYRROLATE 1 MG PO TABS: 1.0000 mg | ORAL_TABLET | ORAL | Status: DC | PRN
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
## 2023-05-31 NOTE — Assessment & Plan Note (Addendum)
Suspected possible Parkinson's versus Lewy body dementia.  Neurology curb sided and recommended no acute intervention at this time.  Recommend patient to follow-up outpatient. -Palliative care has been consulted, appreciate recommendations

## 2023-05-31 NOTE — Assessment & Plan Note (Addendum)
Patient received 1 dose of Unasyn, no further doses of antibiotics recommended per ENT.  Will follow-up with ENT outpatient. -ENT outpatient follow up

## 2023-05-31 NOTE — Assessment & Plan Note (Addendum)
Patient appears stable, without pain at this time.  Suspect this fall is multifactorial in setting of dementia, deconditioning, prescription medications.  Patient cleared by PT/OT to return to memory care facility. -Continue to hold Depakote, lorazepam.   -Continue scheduled seroquel 50 mg BID -Delirium precautions -AM CBC

## 2023-05-31 NOTE — Progress Notes (Signed)
Daily Progress Note Intern Pager: [phone]  Patient name: Steve Gordon Medical record number: [id number] Date of birth: November 12, 1935 Age: 87 y.o. Gender: male  Primary Care Provider: Caro Laroche, DO Consultants: None Code Status: DNR/DNI  Pt Overview and Major Events to Date:  10/20: Admitted to FMTS   Assessment and Plan: Steve Gordon is an 87 year old male presenting with unwitnessed fall and nasal fracture.  Patient does have dementia and is unable to answer questions or provide any clarity.  He is somewhat more communicative today than yesterday and can answer some simple yes or no questions.  He does not appear to be in pain at this time.   Palliative has been consulted.    Patient cleared by PT/OT to return to memory care facility.  He is medically stable at this time.  Will continue to work to determine placement.    Pertinent PMH/PSH includes HLD/CAD, HTN, A-fib, dementia, CKD. Assessment & Plan Fall Patient appears stable, without pain at this time.  Suspect this fall is multifactorial in setting of dementia, deconditioning, prescription medications.  Patient cleared by PT/OT to return to memory care facility. -Continue to hold Depakote, lorazepam.   -Continue scheduled seroquel 50 mg BID -Delirium precautions -AM CBC Lewy body dementia (HCC) Suspected possible Parkinson's versus Lewy body dementia.  Neurology curb sided and recommended no acute intervention at this time.  Recommend patient to follow-up outpatient. -Palliative care has been consulted, appreciate recommendations Closed fracture of nasal bone Patient received 1 dose of Unasyn, no further doses of antibiotics recommended per ENT.  Will follow-up with ENT outpatient. -ENT outpatient follow up Elevated troponin Likely an element of demand ischemia given lack of chest pain, SOB as well as stable EKG. Troponins ultimately trended flat. Malnutrition of moderate degree Nutrition has seen and  evaluated patient.  Recommend full regular diet with 1:1 assist with feeding as well as Ensure Enlive 3 times daily and Magic cup.   Chronic and Stable Problems:  HLD-continue home atorvastatin 40 mg HTN-BP acceptable, continue home metoprolol 25 mg Chronic Afib-Not in RVR on EKG and has stable HR, continue home metoprolol 25 mg CKD 3a- monitor BMPs, appears improved from prior Cr check Dementia-Seroquel as needed, stopping scheduled seroquel and prn as above   FEN/GI: Regular diet with Ensure Enlive 3 times daily PPx: SCDs Dispo:SNF pending clinical improvement .   Subjective:  Patient is seen this morning resting in bed, messing with his mittens.  He is very pleasant this morning and tells me he is glad I came to see him.  He denies pain or needs at this time.  Thanks me for coming to see him.  Objective: Temp:  [98 F (36.7 C)-98.2 F (36.8 C)] 98 F (36.7 C) (10/22 0813) Pulse Rate:  [83-107] 107 (10/22 0813) Resp:  [14-20] 20 (10/22 0813) BP: (120-131)/(72-95) 128/95 (10/22 0454) SpO2:  [98 %] 98 % (10/22 0813) Physical Exam: General: Lying in bed playing with mittens on bilateral hands, no acute distress. Cardiovascular: RRR, no murmurs Respiratory: CTA bilaterally, on room air Abdomen: Normoactive bowel sounds.  Soft, nontender Extremities: Moves all extremities equally.  Wearing mitts on both hands.  Laboratory: Most recent CBC Lab Results  Component Value Date   WBC 5.9 05/30/2023   HGB 11.5 (L) 05/30/2023   HCT 35.2 (L) 05/30/2023   MCV 98.6 05/30/2023   PLT 145 (L) 05/30/2023   Most recent BMP    Latest Ref Rng & Units 05/30/2023  8:21 AM  BMP  Glucose 70 - 99 mg/dL 92   BUN 8 - 23 mg/dL 15   Creatinine 1.61 - 1.24 mg/dL 0.96   Sodium 045 - 409 mmol/L 141   Potassium 3.5 - 5.1 mmol/L 3.6   Chloride 98 - 111 mmol/L 109   CO2 22 - 32 mmol/L 25   Calcium 8.9 - 10.3 mg/dL 8.8     Cyndia Skeeters, DO 05/31/2023, 9:35 AM  PGY-1, [facility] Family  Medicine FPTS Intern pager: [phone], text pages welcome Secure chat group [facility] Teaching Service

## 2023-05-31 NOTE — Plan of Care (Signed)
  Problem: Education: Goal: Knowledge of General Education information will improve Description: Including pain rating scale, medication(s)/side effects and non-pharmacologic comfort measures 05/31/2023 1639 by Genevie Ann, RN Outcome: Progressing 05/31/2023 1633 by Genevie Ann, RN Outcome: Progressing   Problem: Health Behavior/Discharge Planning: Goal: Ability to manage health-related needs will improve Outcome: Progressing

## 2023-05-31 NOTE — TOC Progression Note (Signed)
Transition of Care [facility]) - Progression Note    Patient Details  Name: Steve Gordon MRN: [id number] Date of Birth: [birth year]  [facility] CM/SW Contact  Lorri Frederick, LCSW Phone Number: 05/31/2023, 1:51 PM  Clinical Narrative:   CSW spoke with pt wife by phone, discussed current bed offers at [facility] and [facility].  They have not yet heard from the palliative team.      Expected Discharge Plan: Skilled Nursing Facility Barriers to Discharge: Continued Medical Work up, SNF Pending bed offer  Expected Discharge Plan and Services In-house Referral: Clinical Social Work   Post Acute Care Choice: Skilled Nursing Facility Living arrangements for the past 2 months: Assisted Living Facility [facility] memory care)                                       Social Determinants of Health (SDOH) Interventions SDOH Screenings   Food Insecurity: No Food Insecurity (12/13/2022)  Housing: Low Risk  (12/13/2022)  Transportation Needs: No Transportation Needs (12/13/2022)  Utilities: Not At Risk (12/13/2022)  Alcohol Screen: Low Risk  (12/13/2022)  Depression (PHQ2-9): Medium Risk (03/02/2023)  Financial Resource Strain: Low Risk  (12/13/2022)  Physical Activity: Insufficiently Active (12/13/2022)  Social Connections: Moderately Isolated (12/13/2022)  Stress: No Stress Concern Present (12/13/2022)  Tobacco Use: Low Risk  (05/29/2023)    Readmission Risk Interventions     No data to display

## 2023-05-31 NOTE — Assessment & Plan Note (Addendum)
Likely an element of demand ischemia given lack of chest pain, SOB as well as stable EKG. Troponins ultimately trended flat.

## 2023-05-31 NOTE — Plan of Care (Signed)
  Problem: Education: Goal: Knowledge of General Education information will improve Description: Including pain rating scale, medication(s)/side effects and non-pharmacologic comfort measures 05/31/2023 1844 by Genevie Ann, RN Outcome: Progressing 05/31/2023 1639 by Genevie Ann, RN Outcome: Progressing 05/31/2023 1633 by Genevie Ann, RN Outcome: Progressing

## 2023-05-31 NOTE — Care Plan (Signed)
Returned call from patient's wife, [name].  She requested updates about palliative consult.  I discussed that he has not been seen yet, but we hope he will be seen later today/tomorrow.   She expresses that the family has had difficulty in finding placement for Steve Gordon and they are very interested to see what the palliative team recommends regarding palliative care versus hospice so that they can know how to move forward.  She expresses deep gratitude to all of the staff here at [facility] who have helped care for [name].  Cyndia Skeeters, DO [facility], PGY-1 05/31/23 2:07 PM  Service pager [phone]

## 2023-05-31 NOTE — Plan of Care (Signed)
  Problem: Clinical Measurements: Goal: Will remain free from infection Outcome: Progressing Goal: Diagnostic test results will improve Outcome: Progressing Goal: Respiratory complications will improve Outcome: Progressing Goal: Cardiovascular complication will be avoided Outcome: Progressing   Problem: Activity: Goal: Risk for activity intolerance will decrease Outcome: Progressing   Problem: Nutrition: Goal: Adequate nutrition will be maintained Outcome: Progressing   Problem: Elimination: Goal: Will not experience complications related to bowel motility Outcome: Progressing Goal: Will not experience complications related to urinary retention Outcome: Progressing   Problem: Pain Managment: Goal: General experience of comfort will improve Outcome: Progressing   Problem: Safety: Goal: Ability to remain free from injury will improve Outcome: Progressing   Problem: Skin Integrity: Goal: Risk for impaired skin integrity will decrease Outcome: Progressing   Problem: Safety: Goal: Violent Restraint(s) Outcome: Progressing

## 2023-05-31 NOTE — Assessment & Plan Note (Signed)
Nutrition has seen and evaluated patient.  Recommend full regular diet with 1:1 assist with feeding as well as Ensure Enlive 3 times daily and Magic cup.

## 2023-05-31 NOTE — Plan of Care (Signed)
  Problem: Education: Goal: Knowledge of General Education information will improve Description Including pain rating scale, medication(s)/side effects and non-pharmacologic comfort measures Outcome: Progressing   Problem: Health Behavior/Discharge Planning: Goal: Ability to manage health-related needs will improve Outcome: Progressing   

## 2023-06-01 DIAGNOSIS — S022XXB Fracture of nasal bones, initial encounter for open fracture: Secondary | ICD-10-CM | POA: Diagnosis not present

## 2023-06-01 DIAGNOSIS — S022XXA Fracture of nasal bones, initial encounter for closed fracture: Secondary | ICD-10-CM | POA: Diagnosis not present

## 2023-06-01 DIAGNOSIS — Y92098 Other place in other non-institutional residence as the place of occurrence of the external cause: Secondary | ICD-10-CM | POA: Diagnosis not present

## 2023-06-01 DIAGNOSIS — Z7189 Other specified counseling: Secondary | ICD-10-CM | POA: Diagnosis not present

## 2023-06-01 DIAGNOSIS — Z23 Encounter for immunization: Secondary | ICD-10-CM | POA: Diagnosis present

## 2023-06-01 DIAGNOSIS — Z515 Encounter for palliative care: Secondary | ICD-10-CM | POA: Diagnosis not present

## 2023-06-01 DIAGNOSIS — R7989 Other specified abnormal findings of blood chemistry: Secondary | ICD-10-CM | POA: Diagnosis not present

## 2023-06-01 DIAGNOSIS — W1830XA Fall on same level, unspecified, initial encounter: Secondary | ICD-10-CM | POA: Diagnosis not present

## 2023-06-01 DIAGNOSIS — W19XXXA Unspecified fall, initial encounter: Secondary | ICD-10-CM | POA: Diagnosis not present

## 2023-06-01 MED ORDER — LORAZEPAM 2 MG/ML IJ SOLN: 0.5000 mg | Freq: Four times a day (QID) | INTRAMUSCULAR | Status: DC

## 2023-06-01 MED ORDER — LORAZEPAM 2 MG/ML IJ SOLN
1.0000 mg | Freq: Four times a day (QID) | INTRAMUSCULAR | Status: DC
  Administered 2023-06-01: 1 mg via INTRAVENOUS
  Filled 2023-06-01: qty 1

## 2023-06-01 NOTE — Assessment & Plan Note (Addendum)
Patient appears stable, without pain at this time.  Suspect this fall is multifactorial in setting of dementia, deconditioning, prescription medications.  Patient cleared by PT/OT to return to memory care facility however family decided to pursue comfort care/hospice after meeting with palliative care yesterday afternoon. --Continue scheduled seroquel 50 mg BID --Delirium precautions --AM CBC

## 2023-06-01 NOTE — Assessment & Plan Note (Signed)
Nutrition has seen and evaluated patient.  Recommend full regular diet with 1:1 assist with feeding as well as Ensure Enlive 3 times daily and Magic cup.

## 2023-06-01 NOTE — Assessment & Plan Note (Addendum)
Suspected possible Parkinson's versus Lewy body dementia.  Neurology curb sided and recommended no acute intervention at this time. Now comfort care and possibly hospice at d/c. --continue depakote 250mg  TID --ativan 1.0mg  q6h scheduled --comfort care PRNs per palliative care: ativan 0.5-1mg  qhr PRN for anxiety/sz/sedation, morphine for pain/dyspnea, glycopyrrolate for excess secretions, zofran for nausea, biotene for dry mouth, liquifilm tears for dry eye

## 2023-06-01 NOTE — Discharge Summary (Signed)
Family Medicine Teaching [facility] Discharge Summary  Patient name: Steve Gordon Medical record number: [id number] Date of birth: Jan 05, 1936 Age: 87 y.o. Gender: male Date of Admission: 05/29/2023  Date of Discharge: 06/01/2023 Admitting Physician: Gerrit Heck, DO  Primary Care Provider: Caro Laroche, DO Consultants: ENT, Palliative Care  Indication for Hospitalization: Fall  Discharge Diagnoses/Problem List:   Principal Problem:   Fall Active Problems:   Closed fracture of nasal bone   Lewy body dementia (HCC)   Malnutrition of moderate degree    Brief Hospital Course:  Steve Gordon is a 87 y.o. male who was admitted to the [facility] Medicine Teaching Service at [facility] for unwitnessed fall and nasal fracture. Hospital course is outlined below by problem.   Fall with nasal fracture Patient had an unwitnessed fall at his memory care facility.  In the ED head and maxillofacial CT showed bilateral nasal bone fractures.  He was initially treated with IV Unasyn, though this was discontinued after ENT was consulted determined patient did not require further antibiotics.   Lewy Body Dementia  Goals of Care Cognitive and physical decline noted over past few months by family.  Episodes of aggression and combativeness likely secondary to end stage dementia. Utilized soft restraints, and then Seroquel and Haldol with improvement. Palliative consulted and decision made for DNR/DNI, transition to comfort care with disposition to inpatient hospice facility.   Elevated troponin Troponins were initially 56 but plateaued and ultimately down trended.  Very likely demand ischemia.  Patient remained stable throughout the rest of his hospitalization.  Other conditions that were chronic and stable:  HLD, HTN, Chronic Afib, CKD 3a,      Disposition: Hospice facility  Discharge Exam:  General: Elderly male sleeping comfortably in bed in NAD Pulm: respirations are even and  unlabored  Vitals:   05/31/23 2200 06/01/23 0400  BP: (!) 149/97   Pulse: 92 87  Resp: 20 19  Temp:    SpO2: 100% 100%    Significant Labs and Imaging:  Recent Labs  Lab 05/31/23 0936  WBC 6.9  HGB 12.1*  HCT 37.0*  PLT 163   Discharge Medications:  Allergies as of 06/01/2023   No Known Allergies      Medication List     STOP taking these medications    apixaban 2.5 MG Tabs tablet Commonly known as: ELIQUIS   atorvastatin 40 MG tablet Commonly known as: LIPITOR   divalproex 125 MG DR tablet Commonly known as: Depakote   hydrocortisone cream 1 %   LORazepam 0.5 MG tablet Commonly known as: ATIVAN   LORAZEPAM PO   metoprolol tartrate 25 MG tablet Commonly known as: LOPRESSOR   nitroGLYCERIN 0.4 MG SL tablet Commonly known as: NITROSTAT   QUEtiapine 50 MG tablet Commonly known as: SEROQUEL        Discharge Instructions: Please refer to Patient Instructions section of EMR for full details.  Patient was counseled important signs and symptoms that should prompt return to medical care, changes in medications, dietary instructions, activity restrictions, and follow up appointments.     Darral Dash, DO 06/01/2023, 1:12 PM PGY-3, [facility]

## 2023-06-01 NOTE — Assessment & Plan Note (Signed)
Likely an element of demand ischemia given lack of chest pain, SOB as well as stable EKG. Troponins ultimately trended flat.

## 2023-06-01 NOTE — Assessment & Plan Note (Addendum)
Patient received 1 dose of Unasyn, no further doses of antibiotics recommended per ENT.  Will follow-up with ENT outpatient.

## 2023-06-01 NOTE — Progress Notes (Signed)
RN from hospice home at [facility] called unit, report given to Ida, Charity fundraiser.

## 2023-06-01 NOTE — Progress Notes (Signed)
Daily Progress Note Intern Pager: [phone]  Patient name: Steve Gordon Medical record number: [id number] Date of birth: 1935/10/03 Age: 87 y.o. Gender: male  Primary Care Provider: Caro Laroche, DO Consultants: Palliative Code Status: DNR/DNI  Pt Overview and Major Events to Date:  10/20: admitted to [facility] 10/22: Palliative consulted, changed to DNR/DNI  Assessment and Plan: Steve Gordon is an 87 year old male presenting with unwitnessed fall and nasal fracture.  Patient does have dementia and is unable to answer questions or provide any clarity.  He does not appear to be in pain at this time. After discussion with palliative care team, family decided to pursue comfort care, change code status to DNAR/DNI and meet with hospice.  Pertinent PMH/PSH includes HLD/CAD, HTN, A-fib, dementia, CKD.  Assessment & Plan Fall Patient appears stable, without pain at this time.  Suspect this fall is multifactorial in setting of dementia, deconditioning, prescription medications.  Patient cleared by PT/OT to return to memory care facility however family decided to pursue comfort care/hospice after meeting with palliative care yesterday afternoon. --Continue scheduled seroquel 50 mg BID --Delirium precautions --AM CBC Lewy body dementia (HCC) Suspected possible Parkinson's versus Lewy body dementia.  Neurology curb sided and recommended no acute intervention at this time. Now comfort care and possibly hospice at d/c. --continue depakote 250mg  TID --ativan 1.0mg  q6h scheduled --comfort care PRNs per palliative care: ativan 0.5-1mg  qhr PRN for anxiety/sz/sedation, morphine for pain/dyspnea, glycopyrrolate for excess secretions, zofran for nausea, biotene for dry mouth, liquifilm tears for dry eye  Closed fracture of nasal bone Patient received 1 dose of Unasyn, no further doses of antibiotics recommended per ENT.  Will follow-up with ENT outpatient. Malnutrition of moderate  degree Nutrition has seen and evaluated patient.  Recommend full regular diet with 1:1 assist with feeding as well as Ensure Enlive 3 times daily and Magic cup. Elevated troponin (Resolved: 06/01/2023) Likely an element of demand ischemia given lack of chest pain, SOB as well as stable EKG. Troponins ultimately trended flat.   Chronic and Stable Issues: HLD, HTN, chronic Afib, CKD 3a- holding all home meds as pt is now comfort care   FEN/GI: regular PPx: none as pt is comfort care Dispo: pending hospice meeting  Subjective:  Pt sleeping peacefully in bed this morning.  Objective: Temp:  [98 F (36.7 C)-98.7 F (37.1 C)] 98.7 F (37.1 C) (10/22 1405) Pulse Rate:  [87-107] 87 (10/23 0400) Resp:  [17-20] 19 (10/23 0400) BP: (129-149)/(97-100) 149/97 (10/22 2200) SpO2:  [98 %-100 %] 100 % (10/23 0400) Physical Exam: General: Elderly male sleeping comfortably in bed in NAD Pulm: respirations are even and unlabored    Laboratory: Most recent CBC Lab Results  Component Value Date   WBC 6.9 05/31/2023   HGB 12.1 (L) 05/31/2023   HCT 37.0 (L) 05/31/2023   MCV 99.2 05/31/2023   PLT 163 05/31/2023   Most recent BMP    Latest Ref Rng & Units 05/30/2023    8:21 AM  BMP  Glucose 70 - 99 mg/dL 92   BUN 8 - 23 mg/dL 15   Creatinine 3.08 - 1.24 mg/dL 6.57   Sodium 846 - 962 mmol/L 141   Potassium 3.5 - 5.1 mmol/L 3.6   Chloride 98 - 111 mmol/L 109   CO2 22 - 32 mmol/L 25   Calcium 8.9 - 10.3 mg/dL 8.8     Lorayne Bender, MD 06/01/2023, 7:46 AM  PGY-1, [facility] Family [facility] FPTS Intern pager: [phone],  text pages welcome Secure chat group [facility] Teaching Service

## 2023-06-01 NOTE — TOC Transition Note (Signed)
Transition of Care [facility]) - CM/SW Discharge Note   Patient Details  Name: Steve Gordon MRN: [id number] Date of Birth: 11/01/1935  Transition of Care [facility]) CM/SW Contact:  Lorri Frederick, LCSW Phone Number: 06/01/2023, 1:29 PM   Clinical Narrative:   Pt discharging to [facility].  RN call report to [phone].     1315: CSW spoke with [name], confirmed choice of residential hospice admission at hospice home of [facility].     Final next level of care: Hospice Medical Facility Barriers to Discharge: Barriers Resolved   Patient Goals and CMS Choice CMS Medicare.gov Compare Post Acute Care list provided to:: Patient Represenative (must comment) (wife [name]) Choice offered to / list presented to : Spouse  Discharge Placement                Patient chooses bed at:  [facility]) Patient to be transferred to facility by: [facility] Name of family member notified: wife [name] Patient and family notified of of transfer: 06/01/23  Discharge Plan and Services Additional resources added to the After Visit Summary for   In-house Referral: Clinical Social Work   Post Acute Care Choice: Skilled Nursing Facility                               Social Determinants of Health (SDOH) Interventions SDOH Screenings   Food Insecurity: No Food Insecurity (12/13/2022)  Housing: Low Risk  (12/13/2022)  Transportation Needs: No Transportation Needs (12/13/2022)  Utilities: Not At Risk (12/13/2022)  Alcohol Screen: Low Risk  (12/13/2022)  Depression (PHQ2-9): Medium Risk (03/02/2023)  Financial Resource Strain: Low Risk  (12/13/2022)  Physical Activity: Insufficiently Active (12/13/2022)  Social Connections: Moderately Isolated (12/13/2022)  Stress: No Stress Concern Present (12/13/2022)  Tobacco Use: Low Risk  (05/29/2023)     Readmission Risk Interventions     No data to display

## 2023-06-01 NOTE — Plan of Care (Signed)
Patient alert/oriented to self. Patient was asleep all day and compliant with medication administration. Patient did not seem in pain/distress. Patient was turned Q2 hours and comfort care measures were continued. VSS.   Problem: Education: Goal: Knowledge of General Education information will improve Description: Including pain rating scale, medication(s)/side effects and non-pharmacologic comfort measures Outcome: Progressing   Problem: Health Behavior/Discharge Planning: Goal: Ability to manage health-related needs will improve Outcome: Progressing   Problem: Clinical Measurements: Goal: Ability to maintain clinical measurements within normal limits will improve Outcome: Progressing   Problem: Clinical Measurements: Goal: Will remain free from infection Outcome: Progressing   Problem: Clinical Measurements: Goal: Diagnostic test results will improve Outcome: Progressing   Problem: Clinical Measurements: Goal: Respiratory complications will improve Outcome: Progressing   Problem: Clinical Measurements: Goal: Cardiovascular complication will be avoided Outcome: Progressing   Problem: Activity: Goal: Risk for activity intolerance will decrease Outcome: Progressing   Problem: Nutrition: Goal: Adequate nutrition will be maintained Outcome: Progressing   Problem: Coping: Goal: Level of anxiety will decrease Outcome: Progressing   Problem: Elimination: Goal: Will not experience complications related to bowel motility Outcome: Progressing   Problem: Elimination: Goal: Will not experience complications related to urinary retention Outcome: Progressing   Problem: Pain Managment: Goal: General experience of comfort will improve Outcome: Progressing   Problem: Safety: Goal: Ability to remain free from injury will improve Outcome: Progressing   Problem: Skin Integrity: Goal: Risk for impaired skin integrity will decrease Outcome: Progressing   Problem:  Safety: Goal: Violent Restraint(s) Outcome: Progressing   Problem: Safety: Goal: Violent Restraint(s) Outcome: Progressing   Problem: Education: Goal: Knowledge of the prescribed therapeutic regimen will improve Outcome: Progressing   Problem: Coping: Goal: Ability to identify and develop effective coping behavior will improve Outcome: Progressing   Problem: Clinical Measurements: Goal: Quality of life will improve Outcome: Progressing   Problem: Respiratory: Goal: Verbalizations of increased ease of respirations will increase Outcome: Progressing   Problem: Role Relationship: Goal: Family's ability to cope with current situation will improve Outcome: Progressing   Problem: Role Relationship: Goal: Ability to verbalize concerns, feelings, and thoughts to partner or family member will improve Outcome: Progressing   Problem: Role Relationship: Goal: Ability to verbalize concerns, feelings, and thoughts to partner or family member will improve Outcome: Progressing   Problem: Pain Management: Goal: Satisfaction with pain management regimen will improve Outcome: Progressing

## 2023-06-01 NOTE — Progress Notes (Signed)
Spoke to [place], Scientist, physiological at hospice home at [facility], at [phone] . Stated a nurse will call the unit before 2:30 at [phone] for patient report.
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
## 2023-06-10 DEATH — deceased
